# Patient Record
Sex: Female | Born: 1985 | Race: White | Hispanic: No | Marital: Married | Smoking: Never smoker
Health system: Southern US, Community
[De-identification: ages and names within clinical notes are randomized; demographics above are authoritative.]

## PROBLEM LIST (undated history)

## (undated) ENCOUNTER — Inpatient Hospital Stay: Payer: Self-pay | Admitting: Obstetrics and Gynecology

## (undated) ENCOUNTER — Inpatient Hospital Stay: Payer: Self-pay

## (undated) DIAGNOSIS — C7B09 Secondary carcinoid tumors of other sites: Secondary | ICD-10-CM

## (undated) DIAGNOSIS — J189 Pneumonia, unspecified organism: Secondary | ICD-10-CM

## (undated) DIAGNOSIS — Z3480 Encounter for supervision of other normal pregnancy, unspecified trimester: Secondary | ICD-10-CM

## (undated) DIAGNOSIS — R519 Headache, unspecified: Secondary | ICD-10-CM

## (undated) DIAGNOSIS — O99512 Diseases of the respiratory system complicating pregnancy, second trimester: Secondary | ICD-10-CM

## (undated) DIAGNOSIS — R918 Other nonspecific abnormal finding of lung field: Secondary | ICD-10-CM

## (undated) DIAGNOSIS — R011 Cardiac murmur, unspecified: Secondary | ICD-10-CM

## (undated) DIAGNOSIS — R51 Headache: Secondary | ICD-10-CM

## (undated) DIAGNOSIS — C7A00 Malignant carcinoid tumor of unspecified site: Secondary | ICD-10-CM

## (undated) DIAGNOSIS — Z3490 Encounter for supervision of normal pregnancy, unspecified, unspecified trimester: Secondary | ICD-10-CM

## (undated) HISTORY — DX: Encounter for supervision of normal pregnancy, unspecified, unspecified trimester: Z34.90

## (undated) HISTORY — DX: Pneumonia, unspecified organism: J18.9

## (undated) HISTORY — DX: Diseases of the respiratory system complicating pregnancy, second trimester: O99.512

## (undated) HISTORY — DX: Encounter for supervision of other normal pregnancy, unspecified trimester: Z34.80

## (undated) HISTORY — DX: Other nonspecific abnormal finding of lung field: R91.8

## (undated) HISTORY — DX: Cardiac murmur, unspecified: R01.1

---

## 2017-05-23 DIAGNOSIS — Z3480 Encounter for supervision of other normal pregnancy, unspecified trimester: Secondary | ICD-10-CM | POA: Insufficient documentation

## 2017-05-23 HISTORY — DX: Encounter for supervision of other normal pregnancy, unspecified trimester: Z34.80

## 2017-05-23 LAB — BASIC METABOLIC PANEL
BUN: 8 (ref 4–21)
Creatinine: 0.7 (ref 0.5–1.1)
Glucose: 89
Potassium: 3.9 (ref 3.4–5.3)
Sodium: 137 (ref 137–147)

## 2017-05-23 LAB — HEPATIC FUNCTION PANEL
ALT: 14 (ref 7–35)
AST: 16 (ref 13–35)

## 2017-05-23 LAB — HM HIV SCREENING LAB: HM HIV Screening: NEGATIVE

## 2017-05-25 LAB — HM PAP SMEAR: HM Pap smear: NEGATIVE

## 2017-06-20 NOTE — L&D Delivery Note (Signed)
Delivery Note At 8:58 PM a viable female was delivered via Vaginal, Spontaneous (Presentation: ;vtx  ).  APGAR: 8, 9; weight  .   Placenta status:intact , .  Cord:  with the following complications: .  Cord pH:N/A   precipitous second stage . Delivery attended by by nurse Placenta delivered by me . No laceration . Good hemostasis   Anesthesia:  cle Episiotomy:  none Lacerations:  none Suture Repair: n/a Est. Blood Loss (mL):  300cc  Mom to postpartum.  Baby to Couplet care / Skin to Skin.  Gwen Her Schermerhorn 11/18/2017, 9:16 PM

## 2017-08-29 ENCOUNTER — Other Ambulatory Visit: Payer: Self-pay | Admitting: Obstetrics and Gynecology

## 2017-08-29 ENCOUNTER — Encounter: Payer: Self-pay | Admitting: Obstetrics and Gynecology

## 2017-08-29 ENCOUNTER — Inpatient Hospital Stay
Admission: RE | Admit: 2017-08-29 | Discharge: 2017-08-31 | Disposition: A | Discharge status: Home / Self Care | Payer: Managed Care, Other (non HMO) | Source: Ambulatory Visit | Attending: Obstetrics and Gynecology | Admitting: Obstetrics and Gynecology

## 2017-08-29 DIAGNOSIS — O99512 Diseases of the respiratory system complicating pregnancy, second trimester: Principal | ICD-10-CM | POA: Insufficient documentation

## 2017-08-29 DIAGNOSIS — Z3A24 24 weeks gestation of pregnancy: Secondary | ICD-10-CM | POA: Diagnosis not present

## 2017-08-29 DIAGNOSIS — J189 Pneumonia, unspecified organism: Secondary | ICD-10-CM | POA: Diagnosis not present

## 2017-08-29 HISTORY — DX: Pneumonia, unspecified organism: J18.9

## 2017-08-29 LAB — CBC
HCT: 37.1 % (ref 35.0–47.0)
HEMOGLOBIN: 12.8 g/dL (ref 12.0–16.0)
MCH: 28.9 pg (ref 26.0–34.0)
MCHC: 34.4 g/dL (ref 32.0–36.0)
MCV: 83.9 fL (ref 80.0–100.0)
Platelets: 256 10*3/uL (ref 150–440)
RBC: 4.42 MIL/uL (ref 3.80–5.20)
RDW: 13.7 % (ref 11.5–14.5)
WBC: 15.3 10*3/uL — ABNORMAL HIGH (ref 3.6–11.0)

## 2017-08-29 MED ORDER — SODIUM CHLORIDE 0.9% FLUSH
3.0000 mL | Freq: Two times a day (BID) | INTRAVENOUS | Status: DC
  Administered 2017-08-29: 3 mL via INTRAVENOUS

## 2017-08-29 MED ORDER — SODIUM CHLORIDE 0.9 % IV SOLN: 250.0000 mL | INTRAVENOUS | Status: DC | PRN

## 2017-08-29 MED ORDER — GUAIFENESIN 100 MG/5ML PO SOLN
5.0000 mL | ORAL | Status: DC | PRN
  Administered 2017-08-29 – 2017-08-31 (×7): 100 mg via ORAL
  Filled 2017-08-29 (×8): qty 10
  Filled 2017-08-29: qty 5

## 2017-08-29 MED ORDER — DOCUSATE SODIUM 100 MG PO CAPS
100.0000 mg | ORAL_CAPSULE | Freq: Every day | ORAL | Status: DC
  Administered 2017-08-29: 100 mg via ORAL
  Filled 2017-08-29 (×2): qty 1

## 2017-08-29 MED ORDER — ACETAMINOPHEN 325 MG PO TABS
650.0000 mg | ORAL_TABLET | ORAL | Status: DC | PRN
  Administered 2017-08-29 – 2017-08-31 (×9): 650 mg via ORAL
  Filled 2017-08-29 (×10): qty 2

## 2017-08-29 MED ORDER — SODIUM CHLORIDE 0.9 % IV SOLN
2.0000 g | Freq: Every day | INTRAVENOUS | Status: DC
  Administered 2017-08-29 – 2017-08-31 (×3): 2 g via INTRAVENOUS
  Filled 2017-08-29 (×3): qty 20

## 2017-08-29 MED ORDER — SODIUM CHLORIDE 0.9 % IV SOLN
500.0000 mg | Freq: Every day | INTRAVENOUS | Status: DC
  Administered 2017-08-29 – 2017-08-31 (×3): 500 mg via INTRAVENOUS
  Filled 2017-08-29 (×3): qty 500

## 2017-08-29 MED ORDER — ZOLPIDEM TARTRATE 5 MG PO TABS: 5.0000 mg | ORAL_TABLET | Freq: Every evening | ORAL | Status: DC | PRN

## 2017-08-29 MED ORDER — CALCIUM CARBONATE ANTACID 500 MG PO CHEW: 2.0000 | CHEWABLE_TABLET | ORAL | Status: DC | PRN

## 2017-08-29 MED ORDER — SODIUM CHLORIDE 0.9% FLUSH: 3.0000 mL | INTRAVENOUS | Status: DC | PRN

## 2017-08-29 MED ORDER — PRENATAL MULTIVITAMIN CH
1.0000 | ORAL_TABLET | Freq: Every day | ORAL | Status: DC
  Administered 2017-08-30 – 2017-08-31 (×2): 1 via ORAL
  Filled 2017-08-29 (×2): qty 1

## 2017-08-29 NOTE — H&P (Signed)
ANTEPARTUM ADMISSION HISTORY AND PHYSICAL NOTE   History of Present Illness: Patricia Glenn is a 32 y.o. G3 P2002 at [redacted]w[redacted]d admitted for pneumonia confirmed with chest xray done on 08/28/17 and WBC 21.1k with febrile temp 102 during office visit on 08/28/17 for cough, fever and pluritic left chest pain.    Patient reports the fetal movement as active. Patient reports uterine contraction  activity as none. Patient reports  vaginal bleeding as none. Patient describes fluid per vagina as None.   Patient Active Problem List   Diagnosis Date Noted  . Pneumonia complicating pregnancy, second trimester 08/29/2017   Medical hx: obesity, BMI 34; elevated BPs at several prenatal visits- no official HTN dx.  Surgical hx: none OB hx: SVD of macrosomic infants x 2- no GDM, no complications Family hx: MGM- diabetes; maternal aunt- ovarian ca    OB History  Gravida Para Term Preterm AB Living  3 2       2   SAB TAB Ectopic Multiple Live Births          2    # Outcome Date GA Lbr Len/2nd Weight Sex Delivery Anes PTL Lv  3 Gravida           2 Para           1 Para               Social History   Socioeconomic History  . Marital status: Married    Spouse name: None  . Number of children: None  . Years of education: None  . Highest education level: None  Social Needs  . Financial resource strain: None  . Food insecurity - worry: None  . Food insecurity - inability: None  . Transportation needs - medical: None  . Transportation needs - non-medical: None  Occupational History  . None  Tobacco Use  . Smoking status: None  Substance and Sexual Activity  . Alcohol use: None  . Drug use: None  . Sexual activity: None  Other Topics Concern  . None  Social History Narrative  . None    No family history on file.  No Known Allergies  No medications prior to admission.    Review of Systems - Negative except cough x 2 weeks, pleuritic left chest pain, generalized malaise, body  aches, decreased appetite and fever.   Vitals:  BP (!) 128/55 (BP Location: Right Arm)   Pulse (!) 120   Temp 98.2 F (36.8 C) (Oral)   Resp 18   Ht 5\' 5"  (1.651 m)   SpO2 100%    Physical Examination: CONSTITUTIONAL: Well-developed, well-nourished female in no acute distress but frequently has to stop speaking due to cough.  HENT:  Normocephalic, atraumatic.  EYES: Conjunctivae and EOM are normal. Pupils are equal, round, and reactive to light. No scleral icterus.  NECK: Normal range of motion, supple, no masses SKIN: Skin is warm and dry. No rash noted. Not diaphoretic. No erythema. No pallor. [place]: Alert and oriented to person, place, and time. Normal reflexes, muscle tone coordination. No cranial nerve deficit noted. PSYCHIATRIC: Normal mood and affect. Normal behavior. Normal judgment and thought content. CARDIOVASCULAR: Normal heart rate noted, regular rhythm RESPIRATORY: Effort normal, decreased breath sounds with crackles at bases bilaterally, left < right.  ABDOMEN: Soft, nontender, nondistended, gravid. MUSCULOSKELETAL: Normal range of motion. No edema and no tenderness. 2+ distal pulses.  Cervix: Not evaluated.  Membranes:intact Fetal Monitoring:FHR via doppler 159bpm Tocometer: none  Labs:  Results for  orders placed or performed during the hospital encounter of 08/29/17 (from the past 24 hour(s))  CBC on admission   Collection Time: 08/29/17  4:06 PM  Result Value Ref Range   WBC 15.3 (H) 3.6 - 11.0 K/uL   RBC 4.42 3.80 - 5.20 MIL/uL   Hemoglobin 12.8 12.0 - 16.0 g/dL   HCT 37.1 35.0 - 47.0 %   MCV 83.9 80.0 - 100.0 fL   MCH 28.9 26.0 - 34.0 pg   MCHC 34.4 32.0 - 36.0 g/dL   RDW 13.7 11.5 - 14.5 %   Platelets 256 150 - 440 K/uL    Imaging Studies: No results found.   Assessment and Plan: Patient Active Problem List   Diagnosis Date Noted  . Pneumonia complicating pregnancy, second trimester 08/29/2017   Admit to Antenatal Routine antenatal care  with daily NST; FHR doppler with VS.  CBC on admission as above, improved since yesterday.  IV abx per consult with Dr Leonides Schanz, Rocephin 2gm IV q24hr and Azithromycin 500mg  IV q24hr   Demont Linford, [place] A, CNM 08/29/2017 9:50 PM

## 2017-08-30 ENCOUNTER — Other Ambulatory Visit: Payer: Self-pay

## 2017-08-30 DIAGNOSIS — O99512 Diseases of the respiratory system complicating pregnancy, second trimester: Secondary | ICD-10-CM | POA: Diagnosis not present

## 2017-08-30 LAB — CBC
HEMATOCRIT: 36.5 % (ref 35.0–47.0)
HEMOGLOBIN: 12 g/dL (ref 12.0–16.0)
MCH: 27.7 pg (ref 26.0–34.0)
MCHC: 32.9 g/dL (ref 32.0–36.0)
MCV: 84.2 fL (ref 80.0–100.0)
Platelets: 272 10*3/uL (ref 150–440)
RBC: 4.33 MIL/uL (ref 3.80–5.20)
RDW: 13.7 % (ref 11.5–14.5)
WBC: 11.9 10*3/uL — ABNORMAL HIGH (ref 3.6–11.0)

## 2017-08-30 MED ORDER — LACTATED RINGERS IV BOLUS (SEPSIS)
1000.0000 mL | Freq: Once | INTRAVENOUS | Status: AC
  Administered 2017-08-30: 1000 mL via INTRAVENOUS

## 2017-08-30 MED ORDER — LACTATED RINGERS IV SOLN
INTRAVENOUS | Status: DC
Start: 2017-08-30 — End: 2017-08-31
  Administered 2017-08-30: 22:00:00 via INTRAVENOUS

## 2017-08-30 NOTE — Progress Notes (Signed)
Patient in room crying uncontrollably, husband at bedside visibly frustrated- both were initially told on admission and earlier today patient would be able to discharge once second round of IV abx completed and VSS/afebrile. Patient states she feels much better today, no SOB, dry/non productive cough and doesn't understand why she needs to stay another night as she will rest better at home. Lisette Grinder, CNM notified and stated she will come back to talk to patient and husband again shortly.

## 2017-08-30 NOTE — Progress Notes (Signed)
RN in room due to pt calling for a snack; RN in room getting FHT and NT in room getting VS; RN attempted to flush the iv, which is a SL now; met resistance and pt cried out "ouch, that really hurts" when just a little bit of NS went into SL; this iv removed; AC (alicia) present on unit rounding and RN asked her to re-start iv; she went in room to start IV with vein finder; pt refused at this time stating, "I don't have antibiotics until about 6 o'clock pm, I just want the IV started when it's time for my medicines"; AC (alecia) informed RN to have day RN place order for IV team to start IV closer to 1800 today

## 2017-08-30 NOTE — Progress Notes (Signed)
Rn in room to get [place]; pt wanting to sleep tonight; RN made plan with pt; RN to come back when [place] are to be done again (every 8 hours per order) OR if pt calls Rn for anything; pt knows that she can have tylenol and robitussin again at [place]; pt happy with plan; husband at bedside in [place] chair

## 2017-08-30 NOTE — Progress Notes (Signed)
LATE ENTRY   ANTEPARTUM PROGRESS NOTE  Patricia Glenn is a 32 y.o. G3P2 at [redacted]w[redacted]d who is admitted for pneumonia.  Estimated Date of Delivery: 12/18/17.  Length of Stay:  1 Days. Admitted 08/29/2017  Subjective: Still coughing, but overall feeling better.   Patient reports good fetal movement. She reports no uterine contractions, no bleeding and no loss of fluid per vagina.  Vitals:  BP (!) 115/53 (BP Location: Right Arm)   Pulse (!) 103   Temp 98.6 F (37 C) (Oral)   Resp 20   Ht 5\' 5"  (1.651 m)   LMP 03/13/2017   SpO2 100%  Physical Examination: General:   alert, cooperative, appears stated age and no distress  Skin:  normal and no rash or abnormalities  Neurologic:    Alert & oriented x 3  Lungs:   clear to auscultation bilaterally  Heart:   regular rate and rhythm, S1, S2 normal, no murmur, click, rub or gallop  Abdomen:  soft, non-tender; bowel sounds normal; no masses,  no organomegaly  Pelvis:  Exam deferred.  FHT:  153 BPM by doppler this AM  Extremities: : non-tender, symmetric, no edema bilaterally.      Results for orders placed or performed during the hospital encounter of 08/29/17 (from the past 48 hour(s))  CBC on admission     Status: Abnormal   Collection Time: 08/29/17  4:06 PM  Result Value Ref Range   WBC 15.3 (H) 3.6 - 11.0 K/uL   RBC 4.42 3.80 - 5.20 MIL/uL   Hemoglobin 12.8 12.0 - 16.0 g/dL   HCT 37.1 35.0 - 47.0 %   MCV 83.9 80.0 - 100.0 fL   MCH 28.9 26.0 - 34.0 pg   MCHC 34.4 32.0 - 36.0 g/dL   RDW 13.7 11.5 - 14.5 %   Platelets 256 150 - 440 K/uL    Comment: Performed at [facility], [address] [place], [place] [address]  CBC     Status: Abnormal   Collection Time: 08/30/17 11:09 AM  Result Value Ref Range   WBC 11.9 (H) 3.6 - 11.0 K/uL   RBC 4.33 3.80 - 5.20 MIL/uL   Hemoglobin 12.0 12.0 - 16.0 g/dL   HCT 36.5 35.0 - 47.0 %   MCV 84.2 80.0 - 100.0 fL   MCH 27.7 26.0 - 34.0 pg   MCHC 32.9 32.0 - 36.0 g/dL   RDW 13.7 11.5 -  14.5 %   Platelets 272 150 - 440 K/uL    Comment: Performed at [facility], [address] [place], [place] [address]     Current scheduled medications . docusate sodium  100 mg Oral Daily  . prenatal multivitamin  1 tablet Oral Q1200  . sodium chloride flush  3 mL Intravenous Q12H    I have reviewed the patient's current medications.  ASSESSMENT: Patient Active Problem List   Diagnosis Date Noted  . Pneumonia complicating pregnancy, second trimester 08/29/2017    PLAN: Pneumonia: -Afebrile, BP stable -1L LR bolus for HR 100s -Continue IV ABX: ceftriaxone 2gm IV q24h and azithromycin 500mg  IV q24h -FHR by doppler q shift -Daily CBC -Continue inpatient monitoring for 48-72 hours per Dr. Lillette Boxer, CNM 08/30/2017 5:17 PM  Lisette Grinder Certified Nurse Midwife [facility]

## 2017-08-30 NOTE — Plan of Care (Signed)
Vs stable; up ad lib; tolerating regular diet; has 2 iv antibiotics daily around 1800 for pneumonia; FHT WNL; pt did have an NST this shift; has tylenol and robutussin ordered PRN

## 2017-08-31 DIAGNOSIS — O99512 Diseases of the respiratory system complicating pregnancy, second trimester: Secondary | ICD-10-CM | POA: Diagnosis not present

## 2017-08-31 LAB — CBC
HEMATOCRIT: 34 % — AB (ref 35.0–47.0)
HEMOGLOBIN: 11.4 g/dL — AB (ref 12.0–16.0)
MCH: 28.4 pg (ref 26.0–34.0)
MCHC: 33.4 g/dL (ref 32.0–36.0)
MCV: 84.9 fL (ref 80.0–100.0)
Platelets: 256 10*3/uL (ref 150–440)
RBC: 4 MIL/uL (ref 3.80–5.20)
RDW: 13.7 % (ref 11.5–14.5)
WBC: 11.9 10*3/uL — ABNORMAL HIGH (ref 3.6–11.0)

## 2017-08-31 MED ORDER — GUAIFENESIN 100 MG/5ML PO SOLN: 5.0000 mL | ORAL | 0 refills | Status: DC | PRN

## 2017-08-31 MED ORDER — AMOXICILLIN-POT CLAVULANATE 875-125 MG PO TABS: 1.0000 | ORAL_TABLET | Freq: Two times a day (BID) | ORAL | 0 refills | Status: AC

## 2017-08-31 NOTE — Progress Notes (Signed)
Patient understands all discharge instructions and the need to make follow up appointments and instructions on proper medication adminstration. Patient insisted on walking her self downstairs.

## 2017-08-31 NOTE — Plan of Care (Signed)
Vs improved; up ad lib; tolerating regular diet; able to shower last night; taking tylenol and robitussin; probable discharge today

## 2017-08-31 NOTE — Discharge Summary (Signed)
Physician Discharge Summary  Patient ID: Patricia Glenn MRN: [id number] DOB/AGE: [birth year] 32 y.o.  Admit date: 08/29/2017 Discharge date: 08/31/2017  Admission Diagnoses:[redacted] week EGA , LLL pneumonia   Discharge Diagnoses: same  Active Problems:   Pneumonia complicating pregnancy, second trimester   Discharged Condition: good  Hospital Course: pt admitted with LLL pneumonia . Satrted on IV rocephin 2 gm q 24 and 500 mg zithromycin q 24 . Marland Kitchen Pt defervesed and pleuritic cp improved . Feel well on d/c   Consults: None  Significant Diagnostic Studies: labs:  Results for orders placed or performed during the hospital encounter of 08/29/17 (from the past 72 hour(s))  CBC on admission     Status: Abnormal   Collection Time: 08/29/17  4:06 PM  Result Value Ref Range   WBC 15.3 (H) 3.6 - 11.0 K/uL   RBC 4.42 3.80 - 5.20 MIL/uL   Hemoglobin 12.8 12.0 - 16.0 g/dL   HCT 37.1 35.0 - 47.0 %   MCV 83.9 80.0 - 100.0 fL   MCH 28.9 26.0 - 34.0 pg   MCHC 34.4 32.0 - 36.0 g/dL   RDW 13.7 11.5 - 14.5 %   Platelets 256 150 - 440 K/uL    Comment: Performed at [facility], [address] [place], [place] [address]  CBC     Status: Abnormal   Collection Time: 08/30/17 11:09 AM  Result Value Ref Range   WBC 11.9 (H) 3.6 - 11.0 K/uL   RBC 4.33 3.80 - 5.20 MIL/uL   Hemoglobin 12.0 12.0 - 16.0 g/dL   HCT 36.5 35.0 - 47.0 %   MCV 84.2 80.0 - 100.0 fL   MCH 27.7 26.0 - 34.0 pg   MCHC 32.9 32.0 - 36.0 g/dL   RDW 13.7 11.5 - 14.5 %   Platelets 272 150 - 440 K/uL    Comment: Performed at [facility], [address] [place], [place] [address]  CBC     Status: Abnormal   Collection Time: 08/31/17  5:53 AM  Result Value Ref Range   WBC 11.9 (H) 3.6 - 11.0 K/uL   RBC 4.00 3.80 - 5.20 MIL/uL   Hemoglobin 11.4 (L) 12.0 - 16.0 g/dL   HCT 34.0 (L) 35.0 - 47.0 %   MCV 84.9 80.0 - 100.0 fL   MCH 28.4 26.0 - 34.0 pg   MCHC 33.4 32.0 - 36.0 g/dL   RDW 13.7 11.5 - 14.5 %   Platelets  256 150 - 440 K/uL    Comment: Performed at [facility], [address], [place], [place] [address]   Treatments:IV ABX  Discharge Exam: Blood pressure 114/66, pulse 98, temperature 98.3 F (36.8 C), resp. rate 18, height 5\' 5"  (1.651 m), last menstrual period 03/13/2017, SpO2 99 %. General appearance: alert and cooperative Resp: clear to auscultation bilaterally and normal percussion bilaterally Cardio: regular rate and rhythm, S1, S2 normal, no murmur, click, rub or gallop GI: soft, non-tender; bowel sounds normal; no masses,  no organomegaly  Disposition: Final discharge disposition not confirmed See will take her azithromycin 500 mg q 24 for 2 additional days  Discharge Instructions    Call MD for:   Complete by:  As directed    SOB ,   Call MD for:  difficulty breathing, headache or visual disturbances   Complete by:  As directed    Call MD for:  extreme fatigue   Complete by:  As directed    Call MD for:  hives   Complete by:  As directed    Call MD for:  persistant dizziness or light-headedness   Complete by:  As directed    Call MD for:  persistant nausea and vomiting   Complete by:  As directed    Call MD for:  redness, tenderness, or signs of infection (pain, swelling, redness, odor or green/yellow discharge around incision site)   Complete by:  As directed    Call MD for:  severe uncontrolled pain   Complete by:  As directed    Call MD for:  temperature >100.4   Complete by:  As directed    Diet - low sodium heart healthy   Complete by:  As directed    Increase activity slowly   Complete by:  As directed      Allergies as of 08/31/2017   No Known Allergies     Medication List    TAKE these medications   amoxicillin-clavulanate 875-125 MG tablet Commonly known as:  AUGMENTIN Take 1 tablet by mouth 2 (two) times daily for 5 days.   guaiFENesin 100 MG/5ML Soln Commonly known as:  ROBITUSSIN Take 5 mLs (100 mg total) by mouth every 4 (four) hours  as needed for cough or to loosen phlegm.        Signed: Gwen Her Schermerhorn 08/31/2017, 4:13 PM

## 2017-10-03 LAB — CBC AND DIFFERENTIAL
HCT: 37 (ref 36–46)
Hemoglobin: 12.3 (ref 12.0–16.0)
Platelets: 214 (ref 150–399)
WBC: 7.8

## 2017-10-03 LAB — BASIC METABOLIC PANEL: Glucose: 104

## 2017-11-15 ENCOUNTER — Ambulatory Visit
Admission: RE | Admit: 2017-11-15 | Discharge: 2017-11-15 | Disposition: A | Discharge status: Home / Self Care | Payer: Managed Care, Other (non HMO) | Source: Ambulatory Visit | Attending: Specialist | Admitting: Specialist

## 2017-11-15 ENCOUNTER — Other Ambulatory Visit: Payer: Self-pay | Admitting: Specialist

## 2017-11-15 DIAGNOSIS — R918 Other nonspecific abnormal finding of lung field: Secondary | ICD-10-CM

## 2017-11-15 DIAGNOSIS — R042 Hemoptysis: Secondary | ICD-10-CM | POA: Insufficient documentation

## 2017-11-15 MED ORDER — IOPAMIDOL (ISOVUE-370) INJECTION 76%
75.0000 mL | Freq: Once | INTRAVENOUS | Status: AC | PRN
  Administered 2017-11-15: 75 mL via INTRAVENOUS

## 2017-11-16 ENCOUNTER — Other Ambulatory Visit: Payer: Self-pay | Admitting: Obstetrics and Gynecology

## 2017-11-16 ENCOUNTER — Inpatient Hospital Stay
Admission: EM | Admit: 2017-11-16 | Discharge: 2017-11-16 | Disposition: A | Discharge status: Home / Self Care | Payer: Managed Care, Other (non HMO) | Source: Home / Self Care | Admitting: Obstetrics and Gynecology

## 2017-11-16 ENCOUNTER — Encounter: Payer: Self-pay | Admitting: *Deleted

## 2017-11-16 ENCOUNTER — Other Ambulatory Visit: Payer: Self-pay | Admitting: Specialist

## 2017-11-16 ENCOUNTER — Telehealth: Payer: Self-pay | Admitting: Internal Medicine

## 2017-11-16 MED ORDER — BETAMETHASONE SOD PHOS & ACET 6 (3-3) MG/ML IJ SUSP: 12.0000 mg | INTRAMUSCULAR | Status: DC

## 2017-11-16 MED ORDER — BETAMETHASONE SOD PHOS & ACET 6 (3-3) MG/ML IJ SUSP
12.0000 mg | INTRAMUSCULAR | Status: DC
  Administered 2017-11-16: 12 mg via INTRAMUSCULAR

## 2017-11-16 MED ORDER — BETAMETHASONE SOD PHOS & ACET 6 (3-3) MG/ML IJ SUSP
INTRAMUSCULAR | Status: AC
  Administered 2017-11-16: 12 mg via INTRAMUSCULAR
  Filled 2017-11-16: qty 1

## 2017-11-16 NOTE — Progress Notes (Signed)
Set up early induction for tomorrow night with plan for BMZ now and tomorrow. Ordered through [employer]

## 2017-11-16 NOTE — Telephone Encounter (Signed)
Spoke with patient and she is aware of her appt with Dr. Rogue Bussing on 5/31 at 3pm. Contact info given and instructed to call with further questions. Pt verbalized understanding.

## 2017-11-16 NOTE — OB Triage Note (Signed)
Patient came in for first dose of Betamethasone shot. Patient reports  + FM upon arrival and no other complaints. Patient wants to speak to the neonatal nurse practitioner to ask questions pertaining to baby.

## 2017-11-16 NOTE — Telephone Encounter (Signed)
We can see the patient tomorrow at 3pm- have patient arrive at 60

## 2017-11-16 NOTE — Telephone Encounter (Signed)
disucsed with Dr.Fleming; re: large LLL mass; [redacted] weeks pregnant.

## 2017-11-17 ENCOUNTER — Encounter: Payer: Self-pay | Admitting: *Deleted

## 2017-11-17 ENCOUNTER — Encounter: Payer: Self-pay | Admitting: Internal Medicine

## 2017-11-17 ENCOUNTER — Other Ambulatory Visit: Payer: Self-pay | Admitting: Certified Nurse Midwife

## 2017-11-17 ENCOUNTER — Inpatient Hospital Stay: Payer: Managed Care, Other (non HMO) | Attending: Internal Medicine | Admitting: Internal Medicine

## 2017-11-17 ENCOUNTER — Other Ambulatory Visit: Payer: Self-pay

## 2017-11-17 ENCOUNTER — Inpatient Hospital Stay
Admission: EM | Admit: 2017-11-17 | Discharge: 2017-11-19 | DRG: 807 | Disposition: A | Discharge status: Home / Self Care | Payer: Managed Care, Other (non HMO) | Attending: Certified Nurse Midwife | Admitting: Certified Nurse Midwife

## 2017-11-17 VITALS — BP 131/76 | HR 114 | Temp 98.1°F | Resp 18 | Ht 67.0 in | Wt 229.0 lb

## 2017-11-17 DIAGNOSIS — Z3490 Encounter for supervision of normal pregnancy, unspecified, unspecified trimester: Secondary | ICD-10-CM

## 2017-11-17 DIAGNOSIS — R918 Other nonspecific abnormal finding of lung field: Secondary | ICD-10-CM | POA: Insufficient documentation

## 2017-11-17 DIAGNOSIS — C7B09 Secondary carcinoid tumors of other sites: Secondary | ICD-10-CM

## 2017-11-17 DIAGNOSIS — O26893 Other specified pregnancy related conditions, third trimester: Principal | ICD-10-CM | POA: Diagnosis present

## 2017-11-17 DIAGNOSIS — R042 Hemoptysis: Secondary | ICD-10-CM | POA: Diagnosis not present

## 2017-11-17 DIAGNOSIS — Z3A35 35 weeks gestation of pregnancy: Secondary | ICD-10-CM

## 2017-11-17 DIAGNOSIS — C7A00 Malignant carcinoid tumor of unspecified site: Secondary | ICD-10-CM | POA: Insufficient documentation

## 2017-11-17 HISTORY — DX: Encounter for supervision of normal pregnancy, unspecified, unspecified trimester: Z34.90

## 2017-11-17 HISTORY — DX: Other nonspecific abnormal finding of lung field: R91.8

## 2017-11-17 LAB — CBC
HCT: 34.4 % — ABNORMAL LOW (ref 35.0–47.0)
HEMOGLOBIN: 12.4 g/dL (ref 12.0–16.0)
MCH: 30.3 pg (ref 26.0–34.0)
MCHC: 36.2 g/dL — ABNORMAL HIGH (ref 32.0–36.0)
MCV: 83.8 fL (ref 80.0–100.0)
Platelets: 230 10*3/uL (ref 150–440)
RBC: 4.11 MIL/uL (ref 3.80–5.20)
RDW: 14.8 % — ABNORMAL HIGH (ref 11.5–14.5)
WBC: 12.2 10*3/uL — ABNORMAL HIGH (ref 3.6–11.0)

## 2017-11-17 LAB — TYPE AND SCREEN
ABO/RH(D): O POS
ANTIBODY SCREEN: NEGATIVE

## 2017-11-17 MED ORDER — MISOPROSTOL 200 MCG PO TABS
ORAL_TABLET | ORAL | Status: AC
  Administered 2017-11-17: 25 ug via BUCCAL
  Filled 2017-11-17: qty 1

## 2017-11-17 MED ORDER — ONDANSETRON HCL 4 MG/2ML IJ SOLN: 4.0000 mg | Freq: Four times a day (QID) | INTRAMUSCULAR | Status: DC | PRN

## 2017-11-17 MED ORDER — BETAMETHASONE SOD PHOS & ACET 6 (3-3) MG/ML IJ SUSP
12.0000 mg | Freq: Once | INTRAMUSCULAR | Status: AC
  Administered 2017-11-17: 12 mg via INTRAMUSCULAR

## 2017-11-17 MED ORDER — LACTATED RINGERS IV SOLN: 500.0000 mL | INTRAVENOUS | Status: DC | PRN

## 2017-11-17 MED ORDER — TERBUTALINE SULFATE 1 MG/ML IJ SOLN: 0.2500 mg | Freq: Once | INTRAMUSCULAR | Status: DC | PRN

## 2017-11-17 MED ORDER — LIDOCAINE HCL (PF) 1 % IJ SOLN: 30.0000 mL | INTRAMUSCULAR | Status: DC | PRN

## 2017-11-17 MED ORDER — OXYTOCIN BOLUS FROM INFUSION
500.0000 mL | Freq: Once | INTRAVENOUS | Status: AC
  Administered 2017-11-18: 500 mL via INTRAVENOUS

## 2017-11-17 MED ORDER — ACETAMINOPHEN 325 MG PO TABS
650.0000 mg | ORAL_TABLET | ORAL | Status: DC | PRN
  Filled 2017-11-17: qty 2

## 2017-11-17 MED ORDER — SOD CITRATE-CITRIC ACID 500-334 MG/5ML PO SOLN: 30.0000 mL | ORAL | Status: DC | PRN

## 2017-11-17 MED ORDER — OXYTOCIN 10 UNIT/ML IJ SOLN
INTRAMUSCULAR | Status: AC
  Filled 2017-11-17: qty 2

## 2017-11-17 MED ORDER — CEFUROXIME AXETIL 500 MG PO TABS
500.0000 mg | ORAL_TABLET | Freq: Two times a day (BID) | ORAL | Status: DC
  Administered 2017-11-17 – 2017-11-19 (×3): 500 mg via ORAL
  Filled 2017-11-17: qty 1

## 2017-11-17 MED ORDER — LACTATED RINGERS IV SOLN
INTRAVENOUS | Status: DC
  Administered 2017-11-17 – 2017-11-18 (×4): via INTRAVENOUS

## 2017-11-17 MED ORDER — AMMONIA AROMATIC IN INHA
RESPIRATORY_TRACT | Status: AC
  Filled 2017-11-17: qty 10

## 2017-11-17 MED ORDER — BETAMETHASONE SOD PHOS & ACET 6 (3-3) MG/ML IJ SUSP
INTRAMUSCULAR | Status: AC
Start: 2017-11-17 — End: 2017-11-17
  Administered 2017-11-17: 12 mg via INTRAMUSCULAR
  Filled 2017-11-17: qty 1

## 2017-11-17 MED ORDER — MISOPROSTOL 25 MCG QUARTER TABLET
25.0000 ug | ORAL_TABLET | ORAL | Status: DC | PRN
  Administered 2017-11-17 – 2017-11-18 (×4): 25 ug via VAGINAL
  Filled 2017-11-17 (×3): qty 1

## 2017-11-17 MED ORDER — MISOPROSTOL 25 MCG QUARTER TABLET
25.0000 ug | ORAL_TABLET | Freq: Once | ORAL | Status: AC
  Administered 2017-11-17: 25 ug via BUCCAL
  Filled 2017-11-17: qty 1

## 2017-11-17 MED ORDER — PENICILLIN G POT IN DEXTROSE 60000 UNIT/ML IV SOLN
3.0000 10*6.[IU] | INTRAVENOUS | Status: DC
  Administered 2017-11-18 (×2): 3 10*6.[IU] via INTRAVENOUS
  Filled 2017-11-17 (×13): qty 50

## 2017-11-17 MED ORDER — SODIUM CHLORIDE 0.9 % IV SOLN
5.0000 10*6.[IU] | Freq: Once | INTRAVENOUS | Status: AC
  Administered 2017-11-18: 5 10*6.[IU] via INTRAVENOUS
  Filled 2017-11-17: qty 5

## 2017-11-17 MED ORDER — LIDOCAINE HCL (PF) 1 % IJ SOLN
INTRAMUSCULAR | Status: AC
  Filled 2017-11-17: qty 30

## 2017-11-17 MED ORDER — OXYCODONE-ACETAMINOPHEN 5-325 MG PO TABS: 1.0000 | ORAL_TABLET | ORAL | Status: DC | PRN

## 2017-11-17 MED ORDER — OXYCODONE-ACETAMINOPHEN 5-325 MG PO TABS: 2.0000 | ORAL_TABLET | ORAL | Status: DC | PRN

## 2017-11-17 MED ORDER — OXYTOCIN 40 UNITS IN LACTATED RINGERS INFUSION - SIMPLE MED
2.5000 [IU]/h | INTRAVENOUS | Status: DC
  Filled 2017-11-17 (×2): qty 1000

## 2017-11-17 MED ORDER — BUTORPHANOL TARTRATE 1 MG/ML IJ SOLN: 1.0000 mg | INTRAMUSCULAR | Status: DC | PRN

## 2017-11-17 NOTE — Progress Notes (Signed)
[facility] CONSULT NOTE  Patient Care Team: Patient, No Pcp Per as PCP - General (General Practice)  CHIEF COMPLAINTS/PURPOSE OF CONSULTATION:  Left lower lobe lung mass    No history exists.     HISTORY OF PRESENTING ILLNESS:  Patricia Glenn 32 y.o.  female with no prior history of smoking; currently [redacted] weeks pregnant-  was diagnosed with left lower lobe pneumonia sometime in March 2019.  Patient was admitted to the hospital for IV antibiotics-with resolution of fevers.   Patient then was followed up on outpatient basis by pulmonary; Dr. Raul Del as her left lower lobe infiltrate did not improve.  Patient had episode of hemoptysis; when patient subsequently got a CT scan-of the chest that showed unfortunately a 4 to 5 cm large mass posterior/left lower lobe with concerns for local invasion.  She has been referred to oncology for further evaluation recommendations.  Patient denies any loss of appetite.  Is very active.  Denies any nausea vomiting.   Review of Systems  Constitutional: Negative for chills, diaphoresis, fever, malaise/fatigue and weight loss.  HENT: Negative for nosebleeds and sore throat.   Eyes: Negative for double vision.  Respiratory: Negative for cough, hemoptysis, sputum production, shortness of breath and wheezing.   Cardiovascular: Negative for chest pain, palpitations, orthopnea and leg swelling.  Gastrointestinal: Negative for abdominal pain, blood in stool, constipation, diarrhea, heartburn, melena, nausea and vomiting.  Genitourinary: Negative for dysuria, frequency and urgency.  Musculoskeletal: Negative for back pain and joint pain.  Skin: Negative.  Negative for itching and rash.  Neurological: Negative for dizziness, tingling, focal weakness, weakness and headaches.  Endo/Heme/Allergies: Does not bruise/bleed easily.  Psychiatric/Behavioral: Negative for depression. The patient is not nervous/anxious and does not have insomnia.       MEDICAL HISTORY:  History reviewed. No pertinent past medical history.  SURGICAL HISTORY: History reviewed. No pertinent surgical history.  SOCIAL HISTORY: Social History   Socioeconomic History  . Marital status: Married    Spouse name: Not on file  . Number of children: Not on file  . Years of education: Not on file  . Highest education level: Not on file  Occupational History  . Not on file  Social Needs  . Financial resource strain: Not on file  . Food insecurity:    Worry: Not on file    Inability: Not on file  . Transportation needs:    Medical: Not on file    Non-medical: Not on file  Tobacco Use  . Smoking status: Never Smoker  . Smokeless tobacco: Never Used  Substance and Sexual Activity  . Alcohol use: No    Frequency: Never  . Drug use: No  . Sexual activity: Not on file  Lifestyle  . Physical activity:    Days per week: Not on file    Minutes per session: Not on file  . Stress: Not on file  Relationships  . Social connections:    Talks on phone: Not on file    Gets together: Not on file    Attends religious service: Not on file    Active member of club or organization: Not on file    Attends meetings of clubs or organizations: Not on file    Relationship status: Not on file  . Intimate partner violence:    Fear of current or ex partner: Not on file    Emotionally abused: Not on file    Physically abused: Not on file    Forced sexual  activity: Not on file  Other Topics Concern  . Not on file  Social History Narrative    stay home mom-; lives at home; 8 miles from here; no smoking; no second smoking; alcohol ocassional; 2 daughters.     FAMILY HISTORY: grandpa- 80s- bladder cancer  Family History  Problem Relation Age of Onset  . Bladder Cancer Maternal Grandfather 73    ALLERGIES:  has No Known Allergies.  MEDICATIONS:  Current Outpatient Medications  Medication Sig Dispense Refill  . PRENATAL 28-0.8 MG TABS Take 1 tablet by mouth  daily.     Current Facility-Administered Medications  Medication Dose Route Frequency Provider Last Rate Last Dose  . betamethasone acetate-betamethasone sodium phosphate (CELESTONE) injection 12 mg  12 mg Intramuscular Q24 Hr x 2 Benjaman Kindler, MD          .  PHYSICAL EXAMINATION: ECOG PERFORMANCE STATUS: 0 - Asymptomatic  Vitals:   11/17/17 1505  BP: 131/76  Pulse: (!) 114  Resp: 18  Temp: 98.1 F (36.7 C)  SpO2: 99%   Filed Weights   11/17/17 1505  Weight: 229 lb (103.9 kg)    Physical Exam  Constitutional: She is oriented to person, place, and time and well-developed, well-nourished, and in no distress.  HENT:  Head: Normocephalic and atraumatic.  Mouth/Throat: Oropharynx is clear and moist. No oropharyngeal exudate.  Eyes: Pupils are equal, round, and reactive to light.  Neck: Normal range of motion. Neck supple.  Cardiovascular: Normal rate and regular rhythm.  Pulmonary/Chest: No respiratory distress. She has no wheezes.  Abdominal: Soft. Bowel sounds are normal. She exhibits distension. She exhibits no mass. There is no tenderness. There is no rebound and no guarding.  Gravid uterus  Musculoskeletal: Normal range of motion. She exhibits no edema or tenderness.  Neurological: She is alert and oriented to person, place, and time.  Skin: Skin is warm.  Psychiatric: Affect normal.     LABORATORY DATA:  I have reviewed the data as listed Lab Results  Component Value Date   WBC 11.9 (H) 08/31/2017   HGB 11.4 (L) 08/31/2017   HCT 34.0 (L) 08/31/2017   MCV 84.9 08/31/2017   PLT 256 08/31/2017   No results for input(s): NA, K, CL, CO2, GLUCOSE, BUN, CREATININE, CALCIUM, GFRNONAA, GFRAA, PROT, ALBUMIN, AST, ALT, ALKPHOS, BILITOT, BILIDIR, IBILI in the last 8760 hours.  RADIOGRAPHIC STUDIES: I have personally reviewed the radiological images as listed and agreed with the findings in the report. Ct Angio Chest Pe W Or Wo Contrast  Result Date:  11/15/2017 CLINICAL DATA:  Hemoptysis.  Pneumonia.  Thirty-five weeks pregnant. EXAM: CT ANGIOGRAPHY CHEST WITH CONTRAST TECHNIQUE: Multidetector CT imaging of the chest was performed using the standard protocol during bolus administration of intravenous contrast. Multiplanar CT image reconstructions and MIPs were obtained to evaluate the vascular anatomy. CONTRAST:  53mL ISOVUE-370 IOPAMIDOL (ISOVUE-370) INJECTION 76% COMPARISON:  None. FINDINGS: Cardiovascular: There is no filling defect in the pulmonary arterial tree to suggest acute pulmonary thromboembolism. No evidence of aortic aneurysm. Great vessels are grossly patent. Mediastinum/Nodes: No abnormal mediastinal adenopathy. Esophagus is unremarkable. No pericardial effusion. Lungs/Pleura: There is a large left lower lobe mass extending from the left hilum to the left lung base measuring 3.3 x 4.7 x 3.3 cm worrisome for malignancy. It is lobulated and somewhat low density. There is a polypoid element projecting into the left lower lobe lobar airway. There is also occlusion of the left lower lobe bronchus. No pneumothorax.  No pleural effusion.  Upper Abdomen: The mass extends through the diaphragm and appears to invade the spleen. Musculoskeletal: No vertebral compression deformity. Review of the MIP images confirms the above findings. IMPRESSION: Large left lower lobe lung mass extending through the diaphragm in and invading the spleen associated with occlusion of the left lower lobe bronchus. Findings are worrisome for malignancy. PET-CT is recommended. No evidence of pulmonary thromboembolism Electronically Signed   By: Marybelle Killings M.D.   On: 11/15/2017 17:40    ASSESSMENT & PLAN:   Mass of lower lobe of left lung #Left lower lobe lung mass approximately 5 cm in size-with concerns for local invasion-highly concerning for malignancy.  #Discussed regarding need for tissue diagnosis/bronchoscopy.  Discussed that treatment options would include; if  this turns out to be malignancy-including but not limited to-chemotherapy radiation; targeted therapy.  Patient will have a high likely chance of novel mutations-which could well be treated with oral medications.  #Patient would need further work-up including PET scan and MRI of the brain.  #Patient's case was reviewed at the tumor conference; on May 30th.  Referral made to pulmonary, for bronchoscopy approximately 1 week post delivery.  #Third trimester pregnancy-plan to be induced over the weekend.  #Further work-up bronchoscopy/PET scan MRI brain after the delivery.  Discussed with Dr. Ouida Sills.   Thank you Dr. Raul Del for allowing me to participate in the care of your pleasant patient. Please do not hesitate to contact me with questions or concerns in the interim.  Patient introduced to lung navigator [place].  #Follow-up with me/covering physician based upon biopsy results.   .# I reviewed the blood work- with the patient in detail; also reviewed the imaging independently [as summarized above]; and with the patient in detail.   # 60 minutes face-to-face with the patient discussing the above plan of care; more than 50% of time spent on prognosis/ natural history; counseling and coordination.      All questions were answered. The patient knows to call the clinic with any problems, questions or concerns.    Cammie Sickle, MD 11/17/2017 4:59 PM

## 2017-11-17 NOTE — Assessment & Plan Note (Addendum)
#  Left lower lobe lung mass approximately 5 cm in size-with concerns for local invasion-highly concerning for malignancy.  #Discussed regarding need for tissue diagnosis/bronchoscopy.  Discussed that treatment options would include; if this turns out to be malignancy-including but not limited to-chemotherapy radiation; targeted therapy.  Patient will have a high likely chance of novel mutations-which could well be treated with oral medications.  #Patient would need further work-up including PET scan and MRI of the brain.  #Patient's case was reviewed at the tumor conference; on May 30th.  Referral made to pulmonary, for bronchoscopy approximately 1 week post delivery.  #Third trimester pregnancy-plan to be induced over the weekend.  #Further work-up bronchoscopy/PET scan MRI brain after the delivery.  Discussed with Dr. Ouida Sills.   Thank you Dr. Raul Del for allowing me to participate in the care of your pleasant patient. Please do not hesitate to contact me with questions or concerns in the interim.  Patient introduced to lung navigator Rio.  #Follow-up with me/covering physician based upon biopsy results.   .# I reviewed the blood work- with the patient in detail; also reviewed the imaging independently [as summarized above]; and with the patient in detail.   # 60 minutes face-to-face with the patient discussing the above plan of care; more than 50% of time spent on prognosis/ natural history; counseling and coordination.

## 2017-11-17 NOTE — H&P (Signed)
OB History & Physical   History of Present Illness:  Chief Complaint:   HPI:  Patricia Glenn is a 32 y.o. G4P2 female at [redacted]w[redacted]d dated by LMP c/w [redacted]w[redacted]d ultrasound.  She presents to L&D for induction of labor due to lung mass concerning for malignancy.   She reports:  -active fetal movement -no leakage of fluid  -no vaginal bleeding -no contractions  Pregnancy Issues: 1. Pre-pregnancy BMI 34 2. History of LGA babies (4309g and 4139g, no GDM) 3. LLL pneumonia with inpatient hospitalization at 24 weeks 4. Left lung mass concerning for malignancy   Maternal Medical History:  History reviewed. No pertinent past medical history.  History reviewed. No pertinent surgical history.  No Known Allergies  Prior to Admission medications   Medication Sig Start Date End Date Taking? Authorizing Provider  PRENATAL 28-0.8 MG TABS Take 1 tablet by mouth daily.   Yes [provider]     Prenatal care site: [facility] History: She  reports that she has never smoked. She has never used smokeless tobacco. She reports that she does not drink alcohol or use drugs.  Family History: family history includes Bladder Cancer (age of onset: 34) in her maternal grandfather.   Review of Systems: A full review of systems was performed and negative except as noted in the HPI.    Physical Exam:  Vital Signs: BP 134/70 (BP Location: Left Arm)   Pulse (!) 105   Temp 98 F (36.7 C) (Oral)   Resp 18   Ht 5\' 7"  (1.702 m)   Wt 103.9 kg (229 lb)   LMP 03/13/2017   BMI 35.87 kg/m   General:   alert, cooperative, appears stated age and no distress  Skin:  normal and no rash or abnormalities  Neurologic:    Alert & oriented x 3  Lungs:   clear to auscultation bilaterally  Heart:   regular rate and rhythm, S1, S2 normal, no murmur, click, rub or gallop  Abdomen:  soft, non-tender; bowel sounds normal; no masses,  no organomegaly  Pelvis:  Exam deferred.  FHT:  145 BPM   Presentations: cephalic  Cervix:  by RN Marisa Sprinkles at 2105   Dilation: Closed   Effacement: [place]:  Floating   Consistency: soft  Extremities: : non-tender, symmetric, no edema bilaterally.     EFW: 6lbs  Results for orders placed or performed during the hospital encounter of 11/17/17 (from the past 24 hour(s))  CBC     Status: Abnormal   Collection Time: 11/17/17  8:22 PM  Result Value Ref Range   WBC 12.2 (H) 3.6 - 11.0 K/uL   RBC 4.11 3.80 - 5.20 MIL/uL   Hemoglobin 12.4 12.0 - 16.0 g/dL   HCT 34.4 (L) 35.0 - 47.0 %   MCV 83.8 80.0 - 100.0 fL   MCH 30.3 26.0 - 34.0 pg   MCHC 36.2 (H) 32.0 - 36.0 g/dL   RDW 14.8 (H) 11.5 - 14.5 %   Platelets 230 150 - 440 K/uL  Type and screen     Status: None   Collection Time: 11/17/17  9:18 PM  Result Value Ref Range   ABO/RH(D) O POS    Antibody Screen NEG    Sample Expiration      11/20/2017 Performed at [facility], [address], [place], [place] [address]     Pertinent Results:  Prenatal Labs: Blood type/Rh O+  Antibody screen neg  Rubella  Immune  Varicella Immune  RPR NR  HBsAg Neg  HIV NR  GC neg  Chlamydia neg  1 hour GTT 104  3 hour GTT n/a  GBS unknown   FHT: FHR: 140 bpm, variability: moderate,  accelerations:  Present,  decelerations:  Absent Category/reactivity:  Category I TOCO: occasional contractions    Ct Angio Chest Pe W Or Wo Contrast  Result Date: 11/15/2017 CLINICAL DATA:  Hemoptysis.  Pneumonia.  Thirty-five weeks pregnant. EXAM: CT ANGIOGRAPHY CHEST WITH CONTRAST TECHNIQUE: Multidetector CT imaging of the chest was performed using the standard protocol during bolus administration of intravenous contrast. Multiplanar CT image reconstructions and MIPs were obtained to evaluate the vascular anatomy. CONTRAST:  12mL ISOVUE-370 IOPAMIDOL (ISOVUE-370) INJECTION 76% COMPARISON:  None. FINDINGS: Cardiovascular: There is no filling defect in the pulmonary arterial tree to suggest  acute pulmonary thromboembolism. No evidence of aortic aneurysm. Great vessels are grossly patent. Mediastinum/Nodes: No abnormal mediastinal adenopathy. Esophagus is unremarkable. No pericardial effusion. Lungs/Pleura: There is a large left lower lobe mass extending from the left hilum to the left lung base measuring 3.3 x 4.7 x 3.3 cm worrisome for malignancy. It is lobulated and somewhat low density. There is a polypoid element projecting into the left lower lobe lobar airway. There is also occlusion of the left lower lobe bronchus. No pneumothorax.  No pleural effusion. Upper Abdomen: The mass extends through the diaphragm and appears to invade the spleen. Musculoskeletal: No vertebral compression deformity. Review of the MIP images confirms the above findings. IMPRESSION: Large left lower lobe lung mass extending through the diaphragm in and invading the spleen associated with occlusion of the left lower lobe bronchus. Findings are worrisome for malignancy. PET-CT is recommended. No evidence of pulmonary thromboembolism Electronically Signed   By: Marybelle Killings M.D.   On: 11/15/2017 17:40    Assessment:  Patricia Glenn is a 32 y.o. G4P2 female at [redacted]w[redacted]d with left lung mass present for induction of labor.   Plan:  1. Admit to Labor & Delivery; consents reviewed and obtained  2. Fetal Well being  - Fetal Tracing: category I - GBS unknown. Plan to treat prophylactically with Penicillin once labor starts.  - S/P betamethasone x2 due to late preterm status - Presentation: cephalic confirmed by Leopold's   3. Routine OB: - Prenatal labs reviewed, as above - Rh positive - CBC & T&S on admit - Clear fluids, IVF  4. Induction of Labor -  Contractions to be monitored with external toco in place -  Pelvis proven to [place] for induction with misoprostol -  Plan for continuous fetal monitoring  -  Maternal pain control as desired: IVPM, nitrous, regional anesthesia -  Anticipate vaginal  delivery  5. Post Partum Planning: - Infant feeding: breastfeeding - Contraception: BTL  Lisette Grinder, CNM 11/17/2017 10:40 PM ----- Lisette Grinder Certified Nurse Midwife [facility], Department of [facility]

## 2017-11-17 NOTE — Progress Notes (Signed)
  Oncology Nurse Navigator Documentation  Navigator Location: [facility]Med Onc (11/16/17 1600) Referral date to RadOnc/MedOnc: 11/16/17 (11/16/17 1600) )Navigator Encounter Type: Introductory phone call (11/16/17 1600)   Abnormal Finding Date: 11/15/17 (11/16/17 1600)                   Treatment Phase: Abnormal Scans (11/16/17 1600) Barriers/Navigation Needs: Coordination of Care (11/16/17 1600)   Interventions: Coordination of Care (11/16/17 1600)   Coordination of Care: Appts (11/16/17 1600)        Acuity: Level 2 (11/16/17 1600)   Acuity Level 2: Initial guidance, education and coordination as needed;Educational needs;Assistance expediting appointments (11/16/17 1600)    phone call made to patient to give appt details for scheduled appt on 5/31 at 3pm with Dr. Rogue Bussing. Pt introduced to navigator services. All questions answered at the time of call. Contact info given and instructed to call with any further questions or needs. Pt verbalized understanding. Will follow up with pt at appt scheduled with Dr. Jacinto Reap on 5/31 at 3pm. Nothing further needed at this time.  Time Spent with Patient: 30 (11/16/17 1600)

## 2017-11-18 ENCOUNTER — Encounter: Payer: Self-pay | Admitting: *Deleted

## 2017-11-18 ENCOUNTER — Inpatient Hospital Stay: Payer: Managed Care, Other (non HMO) | Admitting: Anesthesiology

## 2017-11-18 MED ORDER — FENTANYL 2.5 MCG/ML W/ROPIVACAINE 0.15% IN NS 100 ML EPIDURAL (ARMC)
EPIDURAL | Status: DC | PRN
  Administered 2017-11-18: 12 mL/h via EPIDURAL

## 2017-11-18 MED ORDER — ACETAMINOPHEN 325 MG PO TABS
650.0000 mg | ORAL_TABLET | ORAL | Status: DC | PRN
  Administered 2017-11-18: 650 mg via ORAL

## 2017-11-18 MED ORDER — LIDOCAINE HCL (PF) 1 % IJ SOLN
INTRAMUSCULAR | Status: DC | PRN
  Administered 2017-11-18: 5 mL via INTRADERMAL

## 2017-11-18 MED ORDER — LIDOCAINE-EPINEPHRINE (PF) 1.5 %-1:200000 IJ SOLN
INTRAMUSCULAR | Status: DC | PRN
  Administered 2017-11-18: 5 mL via EPIDURAL

## 2017-11-18 MED ORDER — LIDOCAINE HCL (PF) 1 % IJ SOLN: INTRAMUSCULAR | Status: DC | PRN

## 2017-11-18 MED ORDER — OXYTOCIN 40 UNITS IN LACTATED RINGERS INFUSION - SIMPLE MED
1.0000 m[IU]/min | INTRAVENOUS | Status: DC
  Administered 2017-11-18: 1 m[IU]/min via INTRAVENOUS
  Filled 2017-11-18: qty 1000

## 2017-11-18 MED ORDER — FENTANYL 2.5 MCG/ML W/ROPIVACAINE 0.15% IN NS 100 ML EPIDURAL (ARMC)
EPIDURAL | Status: AC
  Filled 2017-11-18: qty 100

## 2017-11-18 NOTE — Progress Notes (Signed)
Patient ID: Patricia Glenn, female   DOB: May 08, 1986, 32 y.o.   MRN: [id number] Assuming care . IOL for lung mass. Pt s last cytotec at 0605  reassuring fetal monitoring  Cont cytotec

## 2017-11-18 NOTE — Progress Notes (Signed)
Aniqua Briere is a 32 y.o. G3P2 at [redacted]w[redacted]d  Subjective: Mild ctx pain   Objective: BP 115/62   Pulse 85   Temp 98.1 F (36.7 C) (Oral)   Resp 16   Ht 5\' 7"  (1.702 m)   Wt 103.9 kg (229 lb)   LMP 03/13/2017   BMI 35.87 kg/m  No intake/output data recorded. No intake/output data recorded.  FHT:  FHR: 140 bpm, variability: moderate,  accelerations:  Present,  decelerations:  Absent UC:   regular, every 2 minutes SVE:   Dilation: 2 Effacement (%): 60 Station: -3 Exam by:: L. Elks RN tjs exam AROM  Blood tinge . 2/70/-2 vtx  Labs: Lab Results  Component Value Date   WBC 12.2 (H) 11/17/2017   HGB 12.4 11/17/2017   HCT 34.4 (L) 11/17/2017   MCV 83.8 11/17/2017   PLT 230 11/17/2017    Assessment / Plan: Spontaneous labor, progressing normally Continue to monitor and assess for Pitocin need  Gwen Her Klaira Pesci 11/18/2017, 3:26 PM

## 2017-11-18 NOTE — Progress Notes (Signed)
Labor Progress Note  Patricia Glenn is a 32 y.o. G3P2 at [redacted]w[redacted]d by LMP c/w [redacted]w[redacted]d u/s admitted for induction of labor due to lung mass concerning for malignancy.  Subjective:  Patient resting comfortably in bed, husband at the bedside. Reports that she is feeling contractions, "they feel like period cramps."  Objective: BP (!) 119/58   Pulse 92   Temp 98 F (36.7 C) (Oral)   Resp 18   Ht 5\' 7"  (1.702 m)   Wt 103.9 kg (229 lb)   LMP 03/13/2017   BMI 35.87 kg/m   Fetal Assessment: FHT:  FHR: 130 bpm, variability: moderate,  accelerations:  Present,  decelerations:  Absent Category/reactivity:  Category I UC:   regular, every 2-4 minutes SVE:   At 0605 by RN Marisa Sprinkles Dilation: 1cm  Effacement: Long  Consistency: soft  Membrane status: intact Amniotic color: n/a  Labs: Lab Results  Component Value Date   WBC 12.2 (H) 11/17/2017   HGB 12.4 11/17/2017   HCT 34.4 (L) 11/17/2017   MCV 83.8 11/17/2017   PLT 230 11/17/2017    Assessment / Plan: Induction of labor due to lung mass, latent labor  Induction of Labor: s/p 3 doses of misoprostol, mild contractions Fetal Wellbeing:  Category I Pain Control:  Labor support without medications Anticipated MOD:  NSVD  Lisette Grinder, CNM 11/18/2017, 7:03 AM

## 2017-11-18 NOTE — Anesthesia Preprocedure Evaluation (Addendum)
Anesthesia Evaluation  Patient identified by MRN, date of birth, ID band Patient awake    Reviewed: Allergy & Precautions, H&P , NPO status , reviewed documented beta blocker date and time   Airway Mallampati: II  TM Distance: >3 FB Neck ROM: full    Dental  (+) Teeth Intact   Pulmonary neg pulmonary ROS, pneumonia, resolved,    Pulmonary exam normal        Cardiovascular negative cardio ROS Normal cardiovascular exam     Neuro/Psych negative neurological ROS  negative psych ROS   GI/Hepatic negative GI ROS, Neg liver ROS,   Endo/Other  negative endocrine ROS  Renal/GU      Musculoskeletal   Abdominal   Peds  Hematology negative hematology ROS (+)   Anesthesia Other Findings History reviewed. Prior epidural x 2, no problems  History reviewed. No pertinent surgical history.  BMI    Body Mass Index:  35.87 kg/m      Reproductive/Obstetrics                            Anesthesia Physical Anesthesia Plan  ASA: II and emergent  Anesthesia Plan: Epidural   Post-op Pain Management:    Induction:   PONV Risk Score and Plan: Treatment may vary due to age or medical condition  Airway Management Planned:   Additional Equipment:   Intra-op Plan:   Post-operative Plan:   Informed Consent: I have reviewed the patients History and Physical, chart, labs and discussed the procedure including the risks, benefits and alternatives for the proposed anesthesia with the patient or authorized representative who has indicated his/her understanding and acceptance.   Dental Advisory Given  Plan Discussed with: CRNA  Anesthesia Plan Comments:         Anesthesia Quick Evaluation

## 2017-11-18 NOTE — Anesthesia Procedure Notes (Signed)
Epidural Patient location during procedure: OB Start time: 11/18/2017 6:45 PM  Staffing Anesthesiologist: Alphonsus Sias, MD Performed: anesthesiologist   Preanesthetic Checklist Completed: patient identified, site marked, surgical consent, pre-op evaluation, timeout performed, IV checked, risks and benefits discussed and monitors and equipment checked  Epidural Patient position: sitting Prep: ChloraPrep Patient monitoring: heart rate, cardiac monitor, continuous pulse ox and blood pressure Approach: right paramedian Location: L3-L4 Injection technique: LOR air  Needle:  Needle type: Tuohy  Needle gauge: 17 G Needle length: 9 cm Needle insertion depth: 8 cm Catheter type: closed end flexible Catheter size: 19 Gauge Catheter at skin depth: 13 cm Test dose: 1.5% lidocaine with Epi 1:200 K  Assessment Sensory level: T10  Additional Notes Difficult placement, +CSF on first attempt. Needle withdrawn and repositioned on 2nd attempt. Neg CSF, test dose and small bolus with normal response (ie did not appear to be intrathecal). Discussed possibility of PDPH, will contact anesthesia staff if needed.

## 2017-11-19 LAB — CBC
HEMATOCRIT: 32.4 % — AB (ref 35.0–47.0)
HEMOGLOBIN: 11.5 g/dL — AB (ref 12.0–16.0)
MCH: 29.9 pg (ref 26.0–34.0)
MCHC: 35.4 g/dL (ref 32.0–36.0)
MCV: 84.6 fL (ref 80.0–100.0)
Platelets: 217 10*3/uL (ref 150–440)
RBC: 3.83 MIL/uL (ref 3.80–5.20)
RDW: 14.9 % — ABNORMAL HIGH (ref 11.5–14.5)
WBC: 11.5 10*3/uL — AB (ref 3.6–11.0)

## 2017-11-19 LAB — RPR: RPR: NONREACTIVE

## 2017-11-19 MED ORDER — IBUPROFEN 600 MG PO TABS
600.0000 mg | ORAL_TABLET | Freq: Four times a day (QID) | ORAL | Status: DC
  Administered 2017-11-19 (×4): 600 mg via ORAL
  Filled 2017-11-19 (×4): qty 1

## 2017-11-19 MED ORDER — DIBUCAINE 1 % RE OINT: 1.0000 "application " | TOPICAL_OINTMENT | RECTAL | Status: DC | PRN

## 2017-11-19 MED ORDER — BENZOCAINE-MENTHOL 20-0.5 % EX AERO: 1.0000 "application " | INHALATION_SPRAY | CUTANEOUS | Status: DC | PRN

## 2017-11-19 MED ORDER — ONDANSETRON HCL 4 MG PO TABS: 4.0000 mg | ORAL_TABLET | ORAL | Status: DC | PRN

## 2017-11-19 MED ORDER — SENNOSIDES-DOCUSATE SODIUM 8.6-50 MG PO TABS: 2.0000 | ORAL_TABLET | ORAL | Status: DC

## 2017-11-19 MED ORDER — WITCH HAZEL-GLYCERIN EX PADS: 1.0000 "application " | MEDICATED_PAD | CUTANEOUS | Status: DC | PRN

## 2017-11-19 MED ORDER — MAGNESIUM HYDROXIDE 400 MG/5ML PO SUSP: 30.0000 mL | ORAL | Status: DC | PRN

## 2017-11-19 MED ORDER — MEASLES, MUMPS & RUBELLA VAC ~~LOC~~ INJ
0.5000 mL | INJECTION | Freq: Once | SUBCUTANEOUS | Status: DC
  Filled 2017-11-19: qty 0.5

## 2017-11-19 MED ORDER — BENZOCAINE-MENTHOL 20-0.5 % EX AERO: 1.0000 "application " | INHALATION_SPRAY | CUTANEOUS | 1 refills | Status: DC | PRN

## 2017-11-19 MED ORDER — SIMETHICONE 80 MG PO CHEW
80.0000 mg | CHEWABLE_TABLET | ORAL | Status: DC | PRN
Start: 2017-11-19 — End: 2017-11-19

## 2017-11-19 MED ORDER — ZOLPIDEM TARTRATE 5 MG PO TABS: 5.0000 mg | ORAL_TABLET | Freq: Every evening | ORAL | Status: DC | PRN

## 2017-11-19 MED ORDER — PRENATAL MULTIVITAMIN CH
1.0000 | ORAL_TABLET | Freq: Every day | ORAL | Status: DC
  Administered 2017-11-19: 1 via ORAL
  Filled 2017-11-19: qty 1

## 2017-11-19 MED ORDER — DIPHENHYDRAMINE HCL 25 MG PO CAPS: 25.0000 mg | ORAL_CAPSULE | Freq: Four times a day (QID) | ORAL | Status: DC | PRN

## 2017-11-19 MED ORDER — CEFUROXIME AXETIL 500 MG PO TABS: 500.0000 mg | ORAL_TABLET | Freq: Two times a day (BID) | ORAL | 0 refills | Status: DC

## 2017-11-19 MED ORDER — SENNOSIDES-DOCUSATE SODIUM 8.6-50 MG PO TABS
2.0000 | ORAL_TABLET | ORAL | Status: DC
  Administered 2017-11-19: 2 via ORAL
  Filled 2017-11-19: qty 2

## 2017-11-19 MED ORDER — COCONUT OIL OIL
1.0000 | TOPICAL_OIL | Status: DC | PRN
Start: 2017-11-19 — End: 2017-11-19

## 2017-11-19 MED ORDER — IBUPROFEN 600 MG PO TABS: 600.0000 mg | ORAL_TABLET | Freq: Four times a day (QID) | ORAL | 0 refills | Status: DC

## 2017-11-19 MED ORDER — FERROUS SULFATE 325 (65 FE) MG PO TABS
325.0000 mg | ORAL_TABLET | Freq: Two times a day (BID) | ORAL | Status: DC
  Administered 2017-11-19 (×2): 325 mg via ORAL
  Filled 2017-11-19 (×2): qty 1

## 2017-11-19 MED ORDER — ONDANSETRON HCL 4 MG/2ML IJ SOLN: 4.0000 mg | INTRAMUSCULAR | Status: DC | PRN

## 2017-11-19 NOTE — Progress Notes (Signed)
Discharge orders put in for patient. Patient turns 24 hours post delivery at 20:58 tonight. Infant born at 20 weeks and 5 days and will have to remain a patient at least until tomorrow. RN spoke with provider, Dr. Ouida Sills, to confirm discharge for patient. Provider states that patient can be discharged tonight and room in with infant. RN spoke with patient who states that provider told her she would be discharged tomorrow, however, if he has already put in discharge orders for her today, she is ok with this plan. RN explained rooming in process and information. Patient verbalizes understanding. Patient to watch period of purple cry and CPR videos prior to discharge.

## 2017-11-19 NOTE — Anesthesia Postprocedure Evaluation (Signed)
Anesthesia Post Note  Patient: Patricia Glenn  Procedure(s) Performed: AN AD [place]  Patient location during evaluation: Mother Baby Anesthesia Type: Epidural Level of consciousness: awake and alert Pain management: pain level controlled Vital Signs Assessment: post-procedure vital signs reviewed and stable Respiratory status: spontaneous breathing, nonlabored ventilation and respiratory function stable Cardiovascular status: stable Postop Assessment: no headache and no backache Comments: Documented concern for dural puncture with first epidural placement attempt.  Patient asymptomatic right now sitting up 90 degrees in bed.  Patient and nursing staff instructed to let our team know if patient develops a new headache.  Our team will continue to monitor.     Last Vitals:  Vitals:   11/19/17 0428 11/19/17 0800  BP: 118/73 117/74  Pulse: 98 83  Resp:    Temp: 36.5 C 36.6 C  SpO2: 100% 99%    Last Pain:  Vitals:   11/19/17 0800  TempSrc: Oral  PainSc:                  Precious Haws Piscitello

## 2017-11-19 NOTE — Discharge Summary (Signed)
Obstetric Discharge Summary   Patient ID: Patient Name: Patricia Glenn DOB: 11-Aug-1985 MRN: [id number]  Date of Admission: 11/17/2017 Date of Delivery: 11/18/17 Delivered by: Ouida Sills, MD Date of Discharge: 11/19/2017  Primary OB: [facility]  FAO:[id number] last menstrual period was 03/13/2017. EDC Estimated Date of Delivery: 12/18/17 Gestational Age at Delivery: [redacted]w[redacted]d   Antepartum complications: LLL mass  Admitting Diagnosis: Lung mass , concerning for malignancy  Work up underway . Premature IOL to expedite workup and tx   Secondary Diagnoses: Patient Active Problem List   Diagnosis Date Noted  . Mass of lower lobe of left lung 11/17/2017  . Encounter for induction of labor 11/17/2017  . Pneumonia complicating pregnancy, second trimester 08/29/2017  . Encounter for supervision of other normal pregnancy, unspecified trimester 05/23/2017    Augmentation: Pitocin and Cytotec Complications: None Intrapartum complications/course:  Delivery Type: spontaneous vaginal delivery Anesthesia: epidural Placenta: sponatneous Laceration:  Episiotomy: none  Newborn Data: Live born female  Birth Weight: 5 lb 13.1 oz (2640 g) APGAR: 8, 9  Newborn Delivery   Birth date/time:  11/18/2017 20:58:00 Delivery type:  Vaginal, Spontaneous         Postpartum Course  Patient had an uncomplicated postpartum course.  By time of discharge on PPD#1, her pain was controlled on oral pain medications; she had appropriate lochia and was ambulating, voiding without difficulty and tolerating regular diet.  She was deemed stable for discharge to home.       Labs: CBC Latest Ref Rng & Units 11/19/2017 11/17/2017 08/31/2017  WBC 3.6 - 11.0 K/uL 11.5(H) 12.2(H) 11.9(H)  Hemoglobin 12.0 - 16.0 g/dL 11.5(L) 12.4 11.4(L)  Hematocrit 35.0 - 47.0 % 32.4(L) 34.4(L) 34.0(L)  Platelets 150 - 440 K/uL 217 230 256   O POS  Physical exam:  BP 117/74 (BP Location: Left Arm)   Pulse 83   Temp 97.9  F (36.6 C) (Oral)   Resp 18   Ht 5\' 7"  (1.702 m)   Wt 103.9 kg (229 lb)   LMP 03/13/2017   SpO2 99%   Breastfeeding? Unknown   BMI 35.87 kg/m  General: alert and no distress Pulm: normal respiratory effort Lochia: appropriate Abdomen: soft, NT Uterine Fundus: firm, below umbilicus  Extremities: No evidence of DVT seen on physical exam. No lower extremity edema.   Disposition: stable, discharge to home Baby Feeding: breastmilk Baby Disposition: home with mom  Contraception: unsure  Prenatal Labs:  Blood type/Rh O+  Antibody screen neg  Rubella Immune  Varicella Immune  RPR NR  HBsAg Neg  HIV NR  GC neg  Chlamydia neg  1 hour GTT 104  3 hour GTT n/a  GBS unknown   txed for GBS        Rh Immune globulin given: n/a  Plan:  Patricia Glenn was discharged to home in good condition. Follow-up appointment at [facility] with delivery provider in 6 weeks She is scheduled to f/up with Dr Jamal Collin pulmonologist for bronchoscopy and tissue bx . Oncology is involved   Discharge Instructions: Per After Visit Summary. Activity: Advance as tolerated. Pelvic rest for 6 weeks.   Diet: Regular Discharge Medications: Allergies as of 11/19/2017   No Known Allergies     Medication List    TAKE these medications   benzocaine-Menthol 20-0.5 % Aero Commonly known as:  DERMOPLAST Apply 1 application topically as needed for irritation (perineal discomfort).   cefUROXime 500 MG tablet Commonly known as:  CEFTIN Take 1 tablet (500 mg total) by  mouth 2 (two) times daily.   ibuprofen 600 MG tablet Commonly known as:  ADVIL,MOTRIN Take 1 tablet (600 mg total) by mouth every 6 (six) hours.   PRENATAL 28-0.8 MG Tabs Take 1 tablet by mouth daily.      Outpatient follow up:  Follow-up Information    Schermerhorn, Gwen Her, MD Follow up in 6 week(s).   Specialty:  Obstetrics and Gynecology Contact information: [address] [place] [address] [phone]            Signed:  Gwen Her Schermerhorn

## 2017-11-19 NOTE — Lactation Note (Signed)
This note was copied from a baby's chart. Attempted breastfeeding but infant too sleepy to latch.  Mom has her own Medela pump.  Pumped x 15 mins and received 86ml of colostrum.  Syringe fed 60ml while infant sucked on finger; however she got sleepy very quickly.  Switched to slow flow nipple and infant sucked a little, took 24ml more.  Infant had a large amount of emesis immediately after feeding however.  Discussed plan of care with Mom's RN, will allow infant to rest for an hour or so and then can attempt another feeding.

## 2017-11-19 NOTE — Progress Notes (Addendum)
Discharge instructions given. Patient verbalizes understanding of teaching. Infant to remain a patient. RN explained to patient that she would be rooming in with infant although she would no longer be a patient, she would be responsible for her pain medication, and that she would still be provided food. Pads, underwear, ice packs, towels/washcloths, etc to be given to patient prior to discharge. Patient verbalizes understanding.  Period of Purple Cry and CPR videos shown.

## 2017-11-20 NOTE — Anesthesia Postprocedure Evaluation (Signed)
Anesthesia Post Note  Patient: Patricia Glenn  Procedure(s) Performed: AN AD [place]  Patient location during evaluation: Mother Baby Anesthesia Type: Epidural Level of consciousness: awake and alert Pain management: pain level controlled Vital Signs Assessment: post-procedure vital signs reviewed and stable Respiratory status: spontaneous breathing, nonlabored ventilation and respiratory function stable Cardiovascular status: stable Postop Assessment: no headache, no backache and epidural receding Anesthetic complications: no Comments: Pt doing very well. No evidence of PDPH. No other c/o     Last Vitals: There were no vitals filed for this visit.  Last Pain: There were no vitals filed for this visit.               Alphonsus Sias

## 2017-11-21 ENCOUNTER — Ambulatory Visit (INDEPENDENT_AMBULATORY_CARE_PROVIDER_SITE_OTHER): Payer: Managed Care, Other (non HMO) | Admitting: Pulmonary Disease

## 2017-11-21 ENCOUNTER — Encounter: Payer: Self-pay | Admitting: Pulmonary Disease

## 2017-11-21 ENCOUNTER — Telehealth: Payer: Self-pay | Admitting: *Deleted

## 2017-11-21 VITALS — BP 134/84 | HR 101 | Ht 67.0 in | Wt 220.0 lb

## 2017-11-21 DIAGNOSIS — Z3490 Encounter for supervision of normal pregnancy, unspecified, unspecified trimester: Secondary | ICD-10-CM

## 2017-11-21 DIAGNOSIS — D1432 Benign neoplasm of left bronchus and lung: Secondary | ICD-10-CM | POA: Diagnosis not present

## 2017-11-21 DIAGNOSIS — J189 Pneumonia, unspecified organism: Secondary | ICD-10-CM

## 2017-11-21 DIAGNOSIS — J181 Lobar pneumonia, unspecified organism: Secondary | ICD-10-CM | POA: Diagnosis not present

## 2017-11-21 NOTE — Progress Notes (Signed)
PULMONARY CONSULT NOTE  Requesting MD/Service: Rogue Bussing Date of initial consultation: 11/21/17 Reason for consultation: Obstructing left lower lobe lung mass with postobstructive changes  PT PROFILE: 32 y.o. female never smoker with new finding of left lower lobe mass and postobstructive changes  DATA: CT chest 11/15/2017:  There is a large left lower lobe mass extending from the left hilum to the left lung base measuring 3.3 x 4.7 x 3.3 cm worrisome for malignancy. It is lobulated and somewhat low density. There is a polypoid element projecting into the left lower lobe lobar airway. There is also occlusion of the left lower lobe bronchus.  INTERVAL:  HPI:  32 year old female who just delivered her third child last week after induction.  The delivery was induced due to recent findings on CT of her chest.  Her history is as follows.  She reports being diagnosed with pneumonia in March 2018.  She was treated for this and recovered.  I do not have films to review from that time.  In March of this year she developed fevers, chills, left-sided posterior pleuritic chest pain.  She was hospitalized (under the OB service) for 3 days with treatment consisting of intravenous antibiotics.  After treatment, her symptoms improved dramatically.  A follow-up chest x-ray was performed to 4 weeks after that hospitalization and revealed persistent opacity in the left lower lobe.  A subsequent chest x-ray 2 weeks later again revealed persistent left lower lobe opacity.  Last week, she developed scant hemoptysis was evaluated by Dr. Raul Del who ordered a CT scan of the chest with findings documented above.  Her case was discussed at oncology conference last week and arrangements were made for her to see me for consideration of bronchoscopy.  At the present time, she denies dyspnea, fever, chest pain, purulent sputum, hemoptysis.  She has recovered well from recent pregnancy.  Her baby is healthy and well.    She has  no significant PMH or surgical history except as identified above.   MEDICATIONS: I have reviewed all medications and confirmed regimen as documented  Social History   Socioeconomic History  . Marital status: Married    Spouse name: Not on file  . Number of children: Not on file  . Years of education: Not on file  . Highest education level: Not on file  Occupational History  . Not on file  Social Needs  . Financial resource strain: Not on file  . Food insecurity:    Worry: Not on file    Inability: Not on file  . Transportation needs:    Medical: Not on file    Non-medical: Not on file  Tobacco Use  . Smoking status: Never Smoker  . Smokeless tobacco: Never Used  Substance and Sexual Activity  . Alcohol use: No    Frequency: Never  . Drug use: No  . Sexual activity: Not on file  Lifestyle  . Physical activity:    Days per week: Not on file    Minutes per session: Not on file  . Stress: Not on file  Relationships  . Social connections:    Talks on phone: Not on file    Gets together: Not on file    Attends religious service: Not on file    Active member of club or organization: Not on file    Attends meetings of clubs or organizations: Not on file    Relationship status: Not on file  . Intimate partner violence:    Fear of current  or ex partner: Not on file    Emotionally abused: Not on file    Physically abused: Not on file    Forced sexual activity: Not on file  Other Topics Concern  . Not on file  Social History Narrative    stay home mom-; lives at home; 8 miles from here; no smoking; no second smoking; alcohol ocassional; 2 daughters.     Family History  Problem Relation Age of Onset  . Bladder Cancer Maternal Grandfather 80    ROS: No fever, myalgias/arthralgias, unexplained weight loss or weight gain No new focal weakness or sensory deficits No otalgia, hearing loss, visual changes, nasal and sinus symptoms, mouth and throat problems No neck pain  or adenopathy No abdominal pain, N/V/D, diarrhea, change in bowel pattern No dysuria, change in urinary pattern   Vitals:   11/21/17 0852 11/21/17 0858  BP:  134/84  Pulse:  (!) 101  SpO2:  100%  Weight: 220 lb (99.8 kg)   Height: 5\' 7"  (1.702 m)      EXAM:  Gen: Mildly obese, No overt respiratory distress HEENT: NCAT, sclera white, oropharynx normal Neck: Supple without LAN, thyromegaly, JVD Lungs: breath sounds diminished in left base, otherwise normal Cardiovascular: RRR, no murmurs noted Abdomen: Soft, nontender, normal BS Ext: without clubbing, cyanosis, edema Neuro: CNs grossly intact, motor and sensory intact Skin: Limited exam, no lesions noted  DATA:   No flowsheet data found.  CBC Latest Ref Rng & Units 11/19/2017 11/17/2017 08/31/2017  WBC 3.6 - 11.0 K/uL 11.5(H) 12.2(H) 11.9(H)  Hemoglobin 12.0 - 16.0 g/dL 11.5(L) 12.4 11.4(L)  Hematocrit 35.0 - 47.0 % 32.4(L) 34.4(L) 34.0(L)  Platelets 150 - 440 K/uL 217 230 256    CXR: Plain films of the chest are not available for my review  I have personally reviewed all chest radiographs reported above including CXRs and CT chest unless otherwise indicated  IMPRESSION:     ICD-10-CM   1. 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 Recent pregnancy Z34.90   3. Recent LLL postobstructive pneumonia  J18.1    The official CT scan report not withstanding, I do not believe that this tumor extends across the diaphragm and invades the spleen.  I think more likely this is proximal left lower lobe tumor with an endobronchial component and postobstructive changes in the left lower lobe.  In a non-smoking 32 year old woman, the hope would be that this is a benign tumor such as carcinoid.  She has no symptoms to suggest carcinoid syndrome.  PLAN:  We have arranged for bronchoscopy scheduled for 11/29/2017.  I discussed the procedure in detail with her.  We reviewed the films together.  We discussed alternatives (noting that there are not  really any good alternatives to bronchoscopy given the CT scan findings), risks, etc.  All of her questions were answered.  I have not scheduled follow-up with her in the office but will contact her once the results of the bronchoscopy are available to me and arrange follow-up as needed.   Merton Border, MD PCCM service Mobile [phone] Pager [phone] 11/22/2017 4:02 PM

## 2017-11-21 NOTE — H&P (View-Only) (Signed)
PULMONARY CONSULT NOTE  Requesting MD/Service: Rogue Bussing Date of initial consultation: 11/21/17 Reason for consultation: Obstructing left lower lobe lung mass with postobstructive changes  PT PROFILE: 32 y.o. female never smoker with new finding of left lower lobe mass and postobstructive changes  DATA: CT chest 11/15/2017:  There is a large left lower lobe mass extending from the left hilum to the left lung base measuring 3.3 x 4.7 x 3.3 cm worrisome for malignancy. It is lobulated and somewhat low density. There is a polypoid element projecting into the left lower lobe lobar airway. There is also occlusion of the left lower lobe bronchus.  INTERVAL:  HPI:  32 year old female who just delivered her third child last week after induction.  The delivery was induced due to recent findings on CT of her chest.  Her history is as follows.  She reports being diagnosed with pneumonia in March 2018.  She was treated for this and recovered.  I do not have films to review from that time.  In March of this year she developed fevers, chills, left-sided posterior pleuritic chest pain.  She was hospitalized (under the OB service) for 3 days with treatment consisting of intravenous antibiotics.  After treatment, her symptoms improved dramatically.  A follow-up chest x-ray was performed to 4 weeks after that hospitalization and revealed persistent opacity in the left lower lobe.  A subsequent chest x-ray 2 weeks later again revealed persistent left lower lobe opacity.  Last week, she developed scant hemoptysis was evaluated by Dr. Raul Del who ordered a CT scan of the chest with findings documented above.  Her case was discussed at oncology conference last week and arrangements were made for her to see me for consideration of bronchoscopy.  At the present time, she denies dyspnea, fever, chest pain, purulent sputum, hemoptysis.  She has recovered well from recent pregnancy.  Her baby is healthy and well.    She has  no significant PMH or surgical history except as identified above.   MEDICATIONS: I have reviewed all medications and confirmed regimen as documented  Social History   Socioeconomic History  . Marital status: Married    Spouse name: Not on file  . Number of children: Not on file  . Years of education: Not on file  . Highest education level: Not on file  Occupational History  . Not on file  Social Needs  . Financial resource strain: Not on file  . Food insecurity:    Worry: Not on file    Inability: Not on file  . Transportation needs:    Medical: Not on file    Non-medical: Not on file  Tobacco Use  . Smoking status: Never Smoker  . Smokeless tobacco: Never Used  Substance and Sexual Activity  . Alcohol use: No    Frequency: Never  . Drug use: No  . Sexual activity: Not on file  Lifestyle  . Physical activity:    Days per week: Not on file    Minutes per session: Not on file  . Stress: Not on file  Relationships  . Social connections:    Talks on phone: Not on file    Gets together: Not on file    Attends religious service: Not on file    Active member of club or organization: Not on file    Attends meetings of clubs or organizations: Not on file    Relationship status: Not on file  . Intimate partner violence:    Fear of current  or ex partner: Not on file    Emotionally abused: Not on file    Physically abused: Not on file    Forced sexual activity: Not on file  Other Topics Concern  . Not on file  Social History Narrative    stay home mom-; lives at home; 8 miles from here; no smoking; no second smoking; alcohol ocassional; 2 daughters.     Family History  Problem Relation Age of Onset  . Bladder Cancer Maternal Grandfather 80    ROS: No fever, myalgias/arthralgias, unexplained weight loss or weight gain No new focal weakness or sensory deficits No otalgia, hearing loss, visual changes, nasal and sinus symptoms, mouth and throat problems No neck pain  or adenopathy No abdominal pain, N/V/D, diarrhea, change in bowel pattern No dysuria, change in urinary pattern   Vitals:   11/21/17 0852 11/21/17 0858  BP:  134/84  Pulse:  (!) 101  SpO2:  100%  Weight: 220 lb (99.8 kg)   Height: 5\' 7"  (1.702 m)      EXAM:  Gen: Mildly obese, No overt respiratory distress HEENT: NCAT, sclera white, oropharynx normal Neck: Supple without LAN, thyromegaly, JVD Lungs: breath sounds diminished in left base, otherwise normal Cardiovascular: RRR, no murmurs noted Abdomen: Soft, nontender, normal BS Ext: without clubbing, cyanosis, edema Neuro: CNs grossly intact, motor and sensory intact Skin: Limited exam, no lesions noted  DATA:   No flowsheet data found.  CBC Latest Ref Rng & Units 11/19/2017 11/17/2017 08/31/2017  WBC 3.6 - 11.0 K/uL 11.5(H) 12.2(H) 11.9(H)  Hemoglobin 12.0 - 16.0 g/dL 11.5(L) 12.4 11.4(L)  Hematocrit 35.0 - 47.0 % 32.4(L) 34.4(L) 34.0(L)  Platelets 150 - 440 K/uL 217 230 256    CXR: Plain films of the chest are not available for my review  I have personally reviewed all chest radiographs reported above including CXRs and CT chest unless otherwise indicated  IMPRESSION:     ICD-10-CM   1. 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 Recent pregnancy Z34.90   3. Recent LLL postobstructive pneumonia  J18.1    The official CT scan report not withstanding, I do not believe that this tumor extends across the diaphragm and invades the spleen.  I think more likely this is proximal left lower lobe tumor with an endobronchial component and postobstructive changes in the left lower lobe.  In a non-smoking 32 year old woman, the hope would be that this is a benign tumor such as carcinoid.  She has no symptoms to suggest carcinoid syndrome.  PLAN:  We have arranged for bronchoscopy scheduled for 11/29/2017.  I discussed the procedure in detail with her.  We reviewed the films together.  We discussed alternatives (noting that there are not  really any good alternatives to bronchoscopy given the CT scan findings), risks, etc.  All of her questions were answered.  I have not scheduled follow-up with her in the office but will contact her once the results of the bronchoscopy are available to me and arrange follow-up as needed.   Merton Border, MD PCCM service Mobile [phone] Pager [phone] 11/22/2017 4:02 PM

## 2017-11-21 NOTE — Telephone Encounter (Signed)
Pt informed of date and time this am at appt.  PROVIDER: [place] PROCEDURE: Regular DATE: 11/29/17 TIME: 1pm

## 2017-11-21 NOTE — Telephone Encounter (Signed)
Gave to [place] to get authorized 11/22/17

## 2017-11-21 NOTE — Patient Instructions (Signed)
Bronchoscopy planned for next week 6/12.  I will contact you with results of bronchoscopy specimens and Dr B and I will help direct the next phases of management

## 2017-11-22 ENCOUNTER — Encounter: Payer: Self-pay | Admitting: Pulmonary Disease

## 2017-11-22 NOTE — Telephone Encounter (Signed)
Patricia Glenn stated No Prior Auth Required [phone]

## 2017-11-24 NOTE — Telephone Encounter (Signed)
Pt is having a regular bronch. Please advise on message below.

## 2017-11-24 NOTE — Telephone Encounter (Signed)
Patient wants to know which sedatives are going to be used in procedure.  She needs to consult pediatrician for how long to hold breast milk .

## 2017-11-24 NOTE — Telephone Encounter (Signed)
Typical medications are fentanyl, midazolam. Morphine is also occasionally used. I will forward this to Dr. Alva Garnet to see if there are other medications he uses during bronchoscopy.

## 2017-11-28 NOTE — Telephone Encounter (Signed)
LMOVM for pt to return call 

## 2017-11-28 NOTE — Telephone Encounter (Signed)
Informed pt of meds per DR. Nothing further needed.

## 2017-11-29 ENCOUNTER — Encounter: Payer: Self-pay | Admitting: *Deleted

## 2017-11-29 ENCOUNTER — Encounter
Admission: RE | Disposition: A | Discharge status: Home / Self Care | Payer: Self-pay | Source: Ambulatory Visit | Attending: Pulmonary Disease

## 2017-11-29 ENCOUNTER — Ambulatory Visit
Admission: RE | Admit: 2017-11-29 | Discharge: 2017-11-29 | Disposition: A | Discharge status: Home / Self Care | Payer: Managed Care, Other (non HMO) | Source: Ambulatory Visit | Attending: Pulmonary Disease | Admitting: Pulmonary Disease

## 2017-11-29 DIAGNOSIS — R918 Other nonspecific abnormal finding of lung field: Secondary | ICD-10-CM | POA: Diagnosis not present

## 2017-11-29 DIAGNOSIS — D1432 Benign neoplasm of left bronchus and lung: Secondary | ICD-10-CM | POA: Insufficient documentation

## 2017-11-29 HISTORY — PX: FLEXIBLE BRONCHOSCOPY: SHX5094

## 2017-11-29 SURGERY — BRONCHOSCOPY, FLEXIBLE
Anesthesia: Moderate Sedation

## 2017-11-29 MED ORDER — MIDAZOLAM HCL 2 MG/2ML IJ SOLN
INTRAMUSCULAR | Status: AC
  Filled 2017-11-29: qty 6

## 2017-11-29 MED ORDER — LIDOCAINE HCL 2 % EX GEL
1.0000 "application " | Freq: Once | CUTANEOUS | Status: DC
  Filled 2017-11-29: qty 5

## 2017-11-29 MED ORDER — MIDAZOLAM HCL 2 MG/2ML IJ SOLN
INTRAMUSCULAR | Status: DC | PRN
  Administered 2017-11-29 (×2): 2 mg via INTRAVENOUS

## 2017-11-29 MED ORDER — PHENYLEPHRINE HCL 0.25 % NA SOLN
1.0000 | Freq: Four times a day (QID) | NASAL | Status: DC | PRN
  Filled 2017-11-29: qty 15

## 2017-11-29 MED ORDER — FENTANYL CITRATE (PF) 100 MCG/2ML IJ SOLN
INTRAMUSCULAR | Status: AC
  Administered 2017-11-29: 25 ug
  Filled 2017-11-29: qty 4

## 2017-11-29 MED ORDER — FENTANYL CITRATE (PF) 100 MCG/2ML IJ SOLN
25.0000 ug | Freq: Two times a day (BID) | INTRAMUSCULAR | Status: DC
  Administered 2017-11-29: 25 ug via INTRAVENOUS

## 2017-11-29 MED ORDER — BUTAMBEN-TETRACAINE-BENZOCAINE 2-2-14 % EX AERO
1.0000 | INHALATION_SPRAY | Freq: Once | CUTANEOUS | Status: DC
  Filled 2017-11-29: qty 20

## 2017-11-29 NOTE — Progress Notes (Signed)
proceure finished, pt alert, vss, , will take to postop

## 2017-11-29 NOTE — Procedures (Signed)
Indication:   Obstructing LLL tumor  Premedication:   Fentanyl 50 mcg Midaz 4 mg  Anesthesia: Topical to nose and throat 40 cc of 1% lidocaine used during the course of procedure  Procedure: After adequate sedation and anesthesia, the bronchoscope was introduced via the R naris and advanced into the posterior pharynx this revealed abundant lymphoid tissue in UA. Further anesthesia was obtained with 1% lidocaine and the scope was advanced into the trachea. Complete airway anesthesia was achieved with 1% lidocaine and a thorough airway examination was performed. This revealed the following findings:  Findings:  Upper airway - abundant lymphoid tissue in UA Tracheobronchial anatomy - normal airway arrangement  Bronchial mucosa - diffusely erythematous Other - smooth shiny obstructing tumor at orifice of LLL just distal to take off of superior segment of LLL  Specimens:   Cytology brushings from LLL   Complications: No major complications but lesion bled briskly with cytology brushings so it was felt that EBBx could not be performed safely due to risk of hemorrhage  Post procedure evaluation:  The patient tolerated the procedure well with no major complications CXR - N/I   Merton Border, MD PCCM service Mobile [phone] Pager [phone] 11/29/2017 1:29 PM

## 2017-11-29 NOTE — Discharge Instructions (Signed)
Flexible Bronchoscopy, Care After These instructions give you information on caring for yourself after your procedure. Your doctor may also give you more specific instructions. Call your doctor if you have any problems or questions after your procedure. Follow these instructions at home:  Do not eat or drink anything for 2 hours after your procedure. If you try to eat or drink before the medicine wears off, food or drink could go into your lungs. You could also burn yourself.  After 2 hours have passed and when you can cough and gag normally, you may eat soft food and drink liquids slowly.  The day after the test, you may eat your normal diet.  You may do your normal activities.  Keep all doctor visits. Get help right away if:  You get more and more short of breath.  You get light-headed.  You feel like you are going to pass out (faint).  You have chest pain.  You have new problems that worry you.  You cough up more than a little blood.  You cough up more blood than before. This information is not intended to replace advice given to you by your health care provider. Make sure you discuss any questions you have with your health care provider. Document Released: 04/03/2009 Document Revised: 11/12/2015 Document Reviewed: 02/08/2013 Elsevier Interactive Patient Education  2017 [place]   1) The drugs that you were given will stay in your system until tomorrow so for the next 24 hours you should not:  A) Drive an automobile B) Make any legal decisions C) Drink any alcoholic beverage   2) You may resume regular meals tomorrow.  Today it is better to start with liquids and gradually work up to solid foods.  You may eat anything you prefer, but it is better to start with liquids, then soup and crackers, and gradually work up to solid foods.   3) Please notify your doctor immediately if you have any unusual bleeding, trouble  breathing, redness and pain at the surgery site, drainage, fever, or pain not relieved by medication.    4) Additional Instructions:        Please contact your physician with any problems or Same Day Surgery at [phone], Monday through Friday 6 am to 4 pm, or [place] at [facility] number at [phone].

## 2017-11-29 NOTE — Interval H&P Note (Signed)
History and Physical Interval Note:  11/29/2017 11:30 AM  Patricia Glenn  has presented today for surgery, with the diagnosis of LUNG MASS  The various methods of treatment have been discussed with the patient and family. After consideration of risks, benefits and other options for treatment, the patient has consented to  Procedure(s): FLEXIBLE BRONCHOSCOPY (N/A) as a surgical intervention .  The patient's history has been reviewed, patient examined, no change in status, stable for surgery.  I have reviewed the patient's chart and labs.  Questions were answered to the patient's satisfaction.     Wilhelmina Mcardle

## 2017-11-30 ENCOUNTER — Other Ambulatory Visit: Payer: Self-pay | Admitting: *Deleted

## 2017-11-30 ENCOUNTER — Encounter: Payer: Self-pay | Admitting: Pulmonary Disease

## 2017-11-30 DIAGNOSIS — R918 Other nonspecific abnormal finding of lung field: Secondary | ICD-10-CM

## 2017-11-30 NOTE — Progress Notes (Signed)
Patricia Mcardle, MD  Renelda Mom, LPN        Please schedule consultation with Dr Genevive Bi. He knows of this patient. Please let pt know that this is our plan and to expect a call from his office   Thanks   Waunita Schooner    Ref placed per DS. LMOM for pt to call back to inform her of referral placed.

## 2017-12-01 ENCOUNTER — Telehealth: Payer: Self-pay | Admitting: Pulmonary Disease

## 2017-12-01 LAB — CYTOLOGY - NON PAP

## 2017-12-01 NOTE — Telephone Encounter (Signed)
Pt informed she needs to keep appt with Dr. Tasia Catchings and Dr. Genevive Bi. Pt verbalized understanding. Nothing further needed.

## 2017-12-01 NOTE — Telephone Encounter (Signed)
Called patient to give appointment date to see Dr. Nestor Lewandowsky on [place] 12/07/17 at 2:30.  Patricia Glenn wanted to know if she still needed to keep appointment with Dr. Tasia Catchings on 12/05/17.  Please contact patient and advise.   Thank you! Rhonda J Cobb

## 2017-12-05 ENCOUNTER — Other Ambulatory Visit: Payer: Self-pay

## 2017-12-05 ENCOUNTER — Inpatient Hospital Stay: Payer: Managed Care, Other (non HMO) | Attending: Oncology | Admitting: Oncology

## 2017-12-05 ENCOUNTER — Encounter: Payer: Self-pay | Admitting: Oncology

## 2017-12-05 VITALS — BP 153/91 | HR 85 | Temp 98.2°F | Resp 18 | Wt 211.0 lb

## 2017-12-05 DIAGNOSIS — R918 Other nonspecific abnormal finding of lung field: Secondary | ICD-10-CM | POA: Insufficient documentation

## 2017-12-05 DIAGNOSIS — Z8052 Family history of malignant neoplasm of bladder: Secondary | ICD-10-CM | POA: Diagnosis not present

## 2017-12-05 NOTE — Progress Notes (Signed)
[place] CONSULT NOTE  Patient Care Team: Patient, No Pcp Per as PCP - General ([place]) Telford Nab, RN as Registered Nurse  CHIEF COMPLAINTS/PURPOSE OF CONSULTATION:  Left lower lobe lung mass    No history exists.     HISTORY OF PRESENTING ILLNESS:  Patricia Glenn 32 y.o.  female with no prior history of smoking; presented for follow-up of left lower lobe mass and discussion of biopsy results and future management plan.  I am covering Dr. B to see this patient. Patient oncology history was reviewed.  Patient was pregnant when left lower lobe mass was discovered after left lower lobe pneumonia.  CT scan of the chest showed 4 to 5 cm large mass posterior/left lower lobe with concerns of local invasion.  She delivered her baby 2 weeks ago. Patient's case was reviewed at tumor conference on May 30, patient was referred to pulmonary for biopsy via bronchoscopy Patient had biopsy through bronchoscopy on November 29, 2017, pathology showed nondiagnostic, bronchial epithelial cells.  Patient presents to discuss about pathology results and future plan. Patient otherwise denies any symptoms.  Denies any cough.  Shortness of breath.  She is accompanied by her husband to the clinic.  Review of Systems  Constitutional: Negative for chills, diaphoresis, fever, malaise/fatigue and weight loss.  HENT: Negative for congestion, ear discharge, ear pain, nosebleeds, sinus pain and sore throat.   Eyes: Negative for double vision, photophobia, pain, discharge and redness.  Respiratory: Negative for cough, hemoptysis, sputum production, shortness of breath and wheezing.   Cardiovascular: Negative for chest pain, palpitations, orthopnea, claudication and leg swelling.  Gastrointestinal: Negative for abdominal pain, blood in stool, constipation, diarrhea, heartburn, melena, nausea and vomiting.  Genitourinary: Negative for dysuria, flank pain, frequency, hematuria and urgency.   Musculoskeletal: Negative for back pain, joint pain, myalgias and neck pain.  Skin: Negative.  Negative for itching and rash.  Neurological: Negative for dizziness, tingling, tremors, focal weakness, weakness and headaches.  Endo/Heme/Allergies: Negative for environmental allergies. Does not bruise/bleed easily.  Psychiatric/Behavioral: Negative for depression and hallucinations. The patient is not nervous/anxious and does not have insomnia.      MEDICAL HISTORY:  Past Medical History:  Diagnosis Date  . Lung mass     SURGICAL HISTORY: Past Surgical History:  Procedure Laterality Date  . FLEXIBLE BRONCHOSCOPY N/A 11/29/2017   Procedure: FLEXIBLE BRONCHOSCOPY;  Surgeon: Wilhelmina Mcardle, MD;  Location: [facility] ORS;  Service: Pulmonary;  Laterality: N/A;    SOCIAL HISTORY: Social History   Socioeconomic History  . Marital status: Married    Spouse name: Not on file  . Number of children: Not on file  . Years of education: Not on file  . Highest education level: Not on file  Occupational History  . Not on file  Social Needs  . Financial resource strain: Not on file  . Food insecurity:    Worry: Not on file    Inability: Not on file  . Transportation needs:    Medical: Not on file    Non-medical: Not on file  Tobacco Use  . Smoking status: Never Smoker  . Smokeless tobacco: Never Used  Substance and Sexual Activity  . Alcohol use: No    Frequency: Never  . Drug use: No  . Sexual activity: Not on file  Lifestyle  . Physical activity:    Days per week: Not on file    Minutes per session: Not on file  . Stress: Not on file  Relationships  .  Social connections:    Talks on phone: Not on file    Gets together: Not on file    Attends religious service: Not on file    Active member of club or organization: Not on file    Attends meetings of clubs or organizations: Not on file    Relationship status: Not on file  . Intimate partner violence:    Fear of current or ex  partner: Not on file    Emotionally abused: Not on file    Physically abused: Not on file    Forced sexual activity: Not on file  Other Topics Concern  . Not on file  Social History Narrative    stay home mom-; lives at home; 8 miles from here; no smoking; no second smoking; alcohol ocassional; 2 daughters.     FAMILY HISTORY: grandpa- 80s- bladder cancer  Family History  Problem Relation Age of Onset  . Bladder Cancer Maternal Grandfather 44    ALLERGIES:  has No Known Allergies.  MEDICATIONS:  Current Outpatient Medications  Medication Sig Dispense Refill  . ibuprofen (ADVIL,MOTRIN) 600 MG tablet Take 1 tablet (600 mg total) by mouth every 6 (six) hours. 30 tablet 0  . PRENATAL 28-0.8 MG TABS Take 1 tablet by mouth daily.    . cefUROXime (CEFTIN) 500 MG tablet Take 1 tablet (500 mg total) by mouth 2 (two) times daily. (Patient not taking: Reported on 12/05/2017) 30 tablet 0   No current facility-administered medications for this visit.       Marland Kitchen  PHYSICAL EXAMINATION: ECOG PERFORMANCE STATUS: 0 - Asymptomatic  Vitals:   12/05/17 1337 12/05/17 1339  BP: (!) 153/91   Pulse: 85   Resp: 18   Temp: 98.2 F (36.8 C)   SpO2:  100%   Filed Weights   12/05/17 1337  Weight: 211 lb (95.7 kg)    Physical Exam  Constitutional: She is oriented to person, place, and time and well-developed, well-nourished, and in no distress. No distress.  HENT:  Head: Normocephalic and atraumatic.  Nose: Nose normal.  Mouth/Throat: Oropharynx is clear and moist. No oropharyngeal exudate.  Eyes: Pupils are equal, round, and reactive to light. EOM are normal. Left eye exhibits no discharge. No scleral icterus.  Neck: Normal range of motion. Neck supple. No JVD present.  Cardiovascular: Normal rate, regular rhythm and normal heart sounds.  No murmur heard. Pulmonary/Chest: Effort normal and breath sounds normal. No respiratory distress. She has no wheezes. She has no rales. She exhibits no  tenderness.  Abdominal: Soft. Bowel sounds are normal. She exhibits no distension and no mass. There is no tenderness. There is no rebound and no guarding.  Musculoskeletal: Normal range of motion. She exhibits no edema or tenderness.  Lymphadenopathy:    She has no cervical adenopathy.  Neurological: She is alert and oriented to person, place, and time. No cranial nerve deficit. She exhibits normal muscle tone. Coordination normal.  Skin: Skin is warm and dry. She is not diaphoretic. No erythema.  Skin erythematous rash  Psychiatric: Affect and judgment normal.     LABORATORY DATA:  I have reviewed the data as listed Lab Results  Component Value Date   WBC 11.5 (H) 11/19/2017   HGB 11.5 (L) 11/19/2017   HCT 32.4 (L) 11/19/2017   MCV 84.6 11/19/2017   PLT 217 11/19/2017   No results for input(s): NA, K, CL, CO2, GLUCOSE, BUN, CREATININE, CALCIUM, GFRNONAA, GFRAA, PROT, ALBUMIN, AST, ALT, ALKPHOS, BILITOT, BILIDIR, IBILI in the  last 8760 hours.  RADIOGRAPHIC STUDIES: I have personally reviewed the radiological images as listed and agreed with the findings in the report. Ct Angio Chest Pe W Or Wo Contrast  Result Date: 11/15/2017 CLINICAL DATA:  Hemoptysis.  Pneumonia.  Thirty-five weeks pregnant. EXAM: CT ANGIOGRAPHY CHEST WITH CONTRAST TECHNIQUE: Multidetector CT imaging of the chest was performed using the standard protocol during bolus administration of intravenous contrast. Multiplanar CT image reconstructions and MIPs were obtained to evaluate the vascular anatomy. CONTRAST:  73m ISOVUE-370 IOPAMIDOL (ISOVUE-370) INJECTION 76% COMPARISON:  None. FINDINGS: Cardiovascular: There is no filling defect in the pulmonary arterial tree to suggest acute pulmonary thromboembolism. No evidence of aortic aneurysm. Great vessels are grossly patent. Mediastinum/Nodes: No abnormal mediastinal adenopathy. Esophagus is unremarkable. No pericardial effusion. Lungs/Pleura: There is a large left lower  lobe mass extending from the left hilum to the left lung base measuring 3.3 x 4.7 x 3.3 cm worrisome for malignancy. It is lobulated and somewhat low density. There is a polypoid element projecting into the left lower lobe lobar airway. There is also occlusion of the left lower lobe bronchus. No pneumothorax.  No pleural effusion. Upper Abdomen: The mass extends through the diaphragm and appears to invade the spleen. Musculoskeletal: No vertebral compression deformity. Review of the MIP images confirms the above findings. IMPRESSION: Large left lower lobe lung mass extending through the diaphragm in and invading the spleen associated with occlusion of the left lower lobe bronchus. Findings are worrisome for malignancy. PET-CT is recommended. No evidence of pulmonary thromboembolism Electronically Signed   By: AMarybelle KillingsM.D.   On: 11/15/2017 17:40    ASSESSMENT & PLAN:  1. Lung mass    Pathology results were reviewed and discussed in detail with patient. Patient has been also questions about patient CT scan and asked detailed questions about size and the position of the lung mass. CT chest image was independently reviewed and discussed with patient and his husband. Since bronchoscopy biopsy is nondiagnostic, will refer patient to see Dr. OGenevive Bifor discussion of open biopsy. Differential diagnosis includes lung cancer, neuroendocrine tumor, etc. Patient's case was discussed on tumor board. Plan biopsy by Dr.Oaks..  Patient and her husband are very anxious and have many questions. All questions were answered. The patient knows to call the clinic with any problems, questions or concerns.  Total face to face encounter time for this patient visit was 245m. >50% of the time was  spent in counseling and coordination of care.   ZhEarlie ServerMD 12/05/2017 7:38 PM

## 2017-12-05 NOTE — Progress Notes (Signed)
Patient here today for follow up. No concerns.

## 2017-12-07 ENCOUNTER — Ambulatory Visit: Payer: Self-pay | Admitting: Cardiothoracic Surgery

## 2017-12-07 ENCOUNTER — Telehealth: Payer: Self-pay

## 2017-12-07 NOTE — Telephone Encounter (Signed)
Patient husband calling States they had an appointment with Dr. Genevive Bi that had been cancelled  Just wanted to follow up with nurse about new appointment scheduled Please call to discuss

## 2017-12-07 NOTE — Telephone Encounter (Signed)
Spoke with patient. Pathology results were provided per Dr.Oaks.  Patient rescheduled to 12/11/17 @ 3pm.

## 2017-12-08 NOTE — Telephone Encounter (Signed)
Called patient and advised to f/u with Dr. Genevive Bi and she will received direction on what the plan is for bronchoscopy. Nothing further needed.

## 2017-12-11 ENCOUNTER — Encounter: Payer: Self-pay | Admitting: Cardiothoracic Surgery

## 2017-12-11 ENCOUNTER — Ambulatory Visit (INDEPENDENT_AMBULATORY_CARE_PROVIDER_SITE_OTHER): Payer: Managed Care, Other (non HMO) | Admitting: Cardiothoracic Surgery

## 2017-12-11 VITALS — BP 147/91 | HR 86 | Temp 97.4°F | Resp 18 | Ht 67.0 in | Wt 211.2 lb

## 2017-12-11 DIAGNOSIS — R918 Other nonspecific abnormal finding of lung field: Secondary | ICD-10-CM | POA: Diagnosis not present

## 2017-12-11 NOTE — Patient Instructions (Signed)
We will send the referral to Dr.Edward Gearhardt and [place] office.  Someone will contact you to schedule an appointment.  If you have not heard from anyone within 5 days please call our office and let us know so we can check on this for you.  Please stop by the Radiologist desk at the [facility] and request a disc of all your images to take to the above appointment with you once it is scheduled.   Please stop by the Cancer center and ask for the disc back that you left with Dr.Brahamanday.

## 2017-12-11 NOTE — Progress Notes (Signed)
Patient ID: Patricia Glenn, female   DOB: [birth year], 32 y.o.   MRN: [id number]  Chief Complaint  Patient presents with  . New Patient (Initial Visit)    Large Left Lower Lung mass    Referred By Dr. Gwendolyn Lima Reason for Referral left lower lobe endobronchial mass  HPI Location, Quality, Duration, Severity, Timing, Context, Modifying Factors, Associated Signs and Symptoms.  Patricia Glenn is a 32 y.o. female.  Her problems began about a year ago in March 2018 when she had an episode of the left lower lobe pneumonia.  She was treated with outpatient therapy and did well until earlier this year when she again experienced an episode of left lower lobe pneumonia.  She had several chest x-rays made which revealed the presence of the pneumonia and ultimate clearing.  About a month ago she had an episode of hemoptysis and this prompted further evaluation with a CT scan.  At the time the patient was well into her pregnancy and after she delivered her baby 3 weeks ago she underwent bronchoscopy.  Bronchoscopy revealed a smooth bordered left lower lobe mass just at the level of the superior segment about 1 cm from the secondary carina.  In addition there is a component extrinsic to the bronchus along the inferior pulmonary vein.  The patient states that she does not get short of breath.  She is able to run a half marathon just a few weeks ago.  This was during her pregnancy.  In addition she is fully functional without any symptoms whatsoever.  She has had no further hemoptysis.   Past Medical History:  Diagnosis Date  . Encounter for induction of labor 11/17/2017  . Encounter for supervision of other normal pregnancy, unspecified trimester 05/23/2017   31 y.o. G3P2002 at 59w1dby Patient's last menstrual period was 03/13/2017. Estimated Date of Delivery: 12/18/17  c/w [id number] Sex of baby and name:  " "  Factors complicating this pregnancy   LLL pneumonia 24 weeks  Inpatient  Repeat CXR 3 weeks  post _0   Elevated BP at NOB- watch closely; baseline labs ordered and WNL-05/23/17  Hx of LGA , previous babies weighed 4309 grams and 4139 gra  . Lung mass   . Mass of lower lobe of left lung 11/17/2017  . Pneumonia complicating pregnancy, second trimester 08/29/2017    Past Surgical History:  Procedure Laterality Date  . FLEXIBLE BRONCHOSCOPY N/A 11/29/2017   Procedure: FLEXIBLE BRONCHOSCOPY;  Surgeon: SiWilhelmina McardleMD;  Location: [facility] ORS;  Service: Pulmonary;  Laterality: N/A;    Family History  Problem Relation Age of Onset  . Bladder Cancer Maternal Grandfather 8035  Social History Social History   Tobacco Use  . Smoking status: Never Smoker  . Smokeless tobacco: Never Used  Substance Use Topics  . Alcohol use: No    Frequency: Never  . Drug use: No    No Known Allergies  Current Outpatient Medications  Medication Sig Dispense Refill  . PRENATAL 28-0.8 MG TABS Take 1 tablet by mouth daily.     No current facility-administered medications for this visit.       Review of Systems A complete review of systems was asked and was negative except for the following positive findings recent childbirth, episode of hemoptysis  Blood pressure (!) 147/91, pulse 86, temperature (!) 97.4 F (36.3 C), temperature source Oral, resp. rate 18, height _1  (1.702 m), weight 211 lb 3.2 oz (95.8 kg), SpO2 99 %,  unknown if currently breastfeeding.  Physical Exam CONSTITUTIONAL:  Pleasant, well-developed, well-nourished, and in no acute distress. EYES: Pupils equal and reactive to light, Sclera non-icteric EARS, NOSE, MOUTH AND THROAT:  The oropharynx was clear.  Dentition is good repair.  Oral mucosa pink and moist. LYMPH NODES:  Lymph nodes in the neck and axillae were normal RESPIRATORY:  Lungs were clear.  Normal respiratory effort without pathologic use of accessory muscles of respiration CARDIOVASCULAR: Heart was regular without murmurs.  There were no carotid bruits. GI:  The abdomen was soft, nontender, and nondistended. There were no palpable masses. There was no hepatosplenomegaly. There were normal bowel sounds in all quadrants. GU:  Rectal deferred.   MUSCULOSKELETAL:  Normal muscle strength and tone.  No clubbing or cyanosis.   SKIN:  There were no pathologic skin lesions.  There were no nodules on palpation. NEUROLOGIC:  Sensation is normal.  Cranial nerves are grossly intact. PSYCH:  Oriented to person, place and time.  Mood and affect are normal.  Data Reviewed CT scan and chest x-ray  I have personally reviewed the patient's imaging, laboratory findings and medical records.    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  The location of the lesion is just within the left lower lobe.  She has not had any pulmonary function tests.    Plan    I had a long discussion with her regarding the findings.  The CT scan would suggest that there is a fairly large component of this tumor beyond the bronchus.  I also explained to them that this may just represent postobstructive pneumonic process with mucoid impaction in the bronchioles.  However this would not be known until the time of surgery.  I think that if she would undergo surgical resection this would best be performed at the main campus at [facility].  I have contacted the nurse manager for the cardiothoracic surgeons there and I discussed the case with her.  We will be happy to provide any postoperative follow-up that is needed.  I did explain to the patient the indications for a left lower lobectomy.  She also understands that if margins are close that bronchoplasty or even pneumonectomy may need to be performed for complete eradication of the tumor.  I answered all of her questions.       Nestor Lewandowsky, MD 12/11/2017, 4:02 PM

## 2017-12-13 ENCOUNTER — Telehealth: Payer: Self-pay | Admitting: Cardiothoracic Surgery

## 2017-12-13 NOTE — Telephone Encounter (Signed)
Patient is being sent to [facility] for consult and surgery. Husband states appt is not until 7/11 and they feel that Dr. Genevive Bi wanted it sooner that than. Please advise

## 2017-12-13 NOTE — Telephone Encounter (Signed)
Spoke with patient and appointment is 12/19/17 Dr.Gearhart.

## 2017-12-13 NOTE — Telephone Encounter (Signed)
Referral sent to [facility] for [facility] consult - have not received a call back but noticed on mychart that she is scheduled for 7/11 which seems too far out. Please advise

## 2017-12-18 DIAGNOSIS — C7A00 Malignant carcinoid tumor of unspecified site: Secondary | ICD-10-CM

## 2017-12-18 DIAGNOSIS — C7B09 Secondary carcinoid tumors of other sites: Secondary | ICD-10-CM

## 2017-12-18 HISTORY — DX: Malignant carcinoid tumor of unspecified site: C7A.00

## 2017-12-18 HISTORY — DX: Secondary carcinoid tumors of other sites: C7B.09

## 2017-12-18 NOTE — Progress Notes (Signed)
.  kc °

## 2017-12-19 ENCOUNTER — Other Ambulatory Visit: Payer: Self-pay | Admitting: Cardiothoracic Surgery

## 2017-12-19 ENCOUNTER — Other Ambulatory Visit: Payer: Self-pay

## 2017-12-19 ENCOUNTER — Other Ambulatory Visit: Payer: Self-pay | Admitting: *Deleted

## 2017-12-19 ENCOUNTER — Encounter: Payer: Self-pay | Admitting: Cardiothoracic Surgery

## 2017-12-19 ENCOUNTER — Telehealth: Payer: Self-pay

## 2017-12-19 ENCOUNTER — Institutional Professional Consult (permissible substitution) (INDEPENDENT_AMBULATORY_CARE_PROVIDER_SITE_OTHER): Payer: Managed Care, Other (non HMO) | Admitting: Cardiothoracic Surgery

## 2017-12-19 VITALS — BP 155/94 | HR 79 | Resp 18 | Ht 67.0 in | Wt 210.0 lb

## 2017-12-19 DIAGNOSIS — R918 Other nonspecific abnormal finding of lung field: Secondary | ICD-10-CM

## 2017-12-19 DIAGNOSIS — R042 Hemoptysis: Secondary | ICD-10-CM

## 2017-12-19 NOTE — Patient Instructions (Signed)
Lung Resection, Care After Refer to this sheet in the next few weeks. These instructions provide you with information on caring for yourself after your procedure. Your health care provider may also give you more specific instructions. Your treatment has been planned according to current medical practices, but problems sometimes occur. Call your health care provider if you have any problems or questions after your procedure. What can I expect after the procedure? After your procedure, it is typical to have the following:  You may feel pain in your chest and throat.  Patients may sometimes shiver or feel nauseous during recovery.  Follow these instructions at home:  You may resume a normal diet and activities as directed by your health care provider.  Do not use any tobacco products, including cigarettes, chewing tobacco, or electronic cigarettes. If you need help quitting, ask your health care provider.  There are many different ways to close and cover an incision, including stitches, skin glue, and adhesive strips. Follow your health care provider's instructions on: ? Incision care. ? Bandage (dressing) changes and removal. ? Incision closure removal.  Take medicines only as directed by your health care provider.  Keep all follow-up visits as directed by your health care provider. This is important.  Try to breathe deeply and cough as directed. Holding a pillow firmly over your ribs may help with discomfort.  If you were given an incentive spirometer in the hospital, continue to use it as directed by your health care provider.  Walk as directed by your health care provider.  You may take a shower and gently wash the area of your incision with water and soap as directed by your health care provider. Do not use anything else to clean your incision except as directed by your health care provider. Do not take baths, swim, or use a hot tub until your health care provider approves. Contact a  health care provider if:  You notice redness, swelling, or increasing pain at the incision site.  You are bleeding at the incision site.  You see pus coming from the incision site.  You notice a bad smell coming from the incision site or bandage.  Your incision breaks open.  You cough up blood or pus, or you develop a cough that produces bad-smelling sputum.  You have pain or swelling in your legs.  You have increasing pain that is not controlled with medicine.  You have trouble managing any of the tubes that have been left in place after surgery.  You have fever or chills. Get help right away if:  You have chest pain or an irregular or rapid heartbeat.  You have dizzy episodes or faint.  You have shortness of breath or difficulty breathing.  You have persistent nausea or vomiting.  You have a rash. This information is not intended to replace advice given to you by your health care provider. Make sure you discuss any questions you have with your health care provider. Document Released: 12/24/2004 Document Revised: 11/12/2015 Document Reviewed: 07/26/2013 Elsevier Interactive Patient Education  2018 [employer]. Lung Resection A lung resection is a procedure to remove part or all of a lung. When an entire lung is removed, the procedure is called a pneumonectomy. When only part of a lung is removed, the procedure is called a lobectomy. A lung resection is typically done to get rid of a tumor or cancer, but it may be done to treat other conditions. This procedure can help relieve some or all of  your symptoms and can also help keep the problem from getting worse. Lung resection may provide the best chance for curing your disease. However, the procedure may not necessarily cure lung cancer if that is the problem. Tell a health care provider about:  Any allergies you have.  All medicines you are taking, including vitamins, herbs, eye drops, creams, and over-the-counter  medicines.  Any problems you or family members have had with anesthetic medicines.  Any blood disorders you have.  Any surgeries you have had.  Any medical conditions you have. What are the risks? Generally, lung resection is a safe procedure. However, problems can occur and include:  Excessive bleeding.  Infection.  Inability to breathe without a ventilator.  Persistent shortness of breath.  Heart problems, including abnormal rhythms and a risk of heart attack or heart failure.  Blood clots.  Injury to a blood vessel.  Injury to a nerve.  Failure to heal properly.  Stroke.  Bronchopleural fistula. This is a small hole between one of the main breathing tubes (bronchus) and the lining of the lungs. This is rare.  Reaction to anesthesia.  What happens before the procedure? You may have tests done before the procedure, including:  Blood tests.  Urine tests.  X-rays.  Other imaging tests (such as CT scans, MRI scans, and PET scans). These tests are done to find the exact size and location of the condition being treated with this surgery.  Pulmonary function tests. These are breathing tests to assess the function of your lungs before surgery and to decide how to best help your breathing after surgery.  Heart testing. This is done to make sure your heart is strong enough for the procedure.  Bronchoscopy. This is a technique that allows your health care provider to look at the inside of your airways. This is done using a soft, flexible tube (bronchoscope). Along with imaging tests, this can help your health care provider know the exact location and size of the area that will be removed during surgery.  Lymph node sampling. This may need to be done to see if the tumor has spread. It may be done as a separate surgery or right before your lung resection procedure.  What happens during the procedure?  An IV tube will be placed in your arm. You will be given a medicine  that makes you fall asleep (general anesthetic). You may also get pain medicine through a thin, flexible tube (catheter) in your back.  A breathing tube will be placed in your throat.  Once the surgical team has prepared you for surgery, your surgeon will make an incision on your side. Some resections are done through large incisions, while others can be done through small incisions using smaller instruments and assisted with small cameras (laparoscopic surgery).  Your surgeon will carefully cut the veins, arteries, and bronchus leading to your lung. After being cut, each of these pieces will be sewn or stapled closed. The lung or part of the lung will then be removed.  Your surgeon will check inside your chest to make sure there is no bleeding in or around the lungs. Lymph nodes near the lung may also be removed for later tests.  Your surgeon may put tubes into your chest to drain extra fluid and air after surgery.  Your incision will be closed. This may be done using: ? Stitches that absorb into your body and do not need to be removed. ? Stitches that must be removed. ? Staples  that must be removed. What happens after the procedure?  You will be taken to the recovery area and your progress will be monitored. You may still have a breathing tube and other tubes or catheters in your body immediately after surgery. These will be removed during your recovery. You may be put on a respirator following surgery if some assistance is needed to help your breathing. When you are awake and not experiencing immediate problems from surgery, you will be moved to the intensive care unit (ICU) where you will continue your recovery.  You may feel pain in your chest and throat. Sometimes during recovery, patients may shiver or feel nauseous. You will be given medicine to help with pain and nausea.  The breathing tube will be taken out as soon as your health care providers feel you can breathe on your own. For  most people, this happens on the same day as the surgery.  If your surgery and time in the ICU go well, most of the tubes and equipment will be taken out within 1-2 days after surgery. This is about how long most people stay in the ICU. You may need to stay longer, depending on how you are doing.  You should also start respiratory therapy in the ICU. This therapy uses breathing exercises to help your other lung stay healthy and get stronger.  As you improve, you will be moved to a regular hospital room for continued respiratory therapy, help with your bladder and bowels, and to continue medicines.  After your lung or part of your lung is taken out, there will be a space inside your chest. This space will often fill up with fluid over time. The amount of time this takes is different for each person.  You will receive care until you are doing well and your health care provider feels it is safe for you to go home or to transfer to an extended care facility. This information is not intended to replace advice given to you by your health care provider. Make sure you discuss any questions you have with your health care provider. Document Released: 08/27/2002 Document Revised: 11/12/2015 Document Reviewed: 07/26/2013 Elsevier Interactive Patient Education  2018 [employer]. Pulmonary Nodule A pulmonary nodule is a small, round growth of tissue in the lung. Pulmonary nodules can range in size from less than 1/5 inch (4 mm) to a little bigger than an inch (25 mm). Most pulmonary nodules are detected when imaging tests of the lung are being performed for a different problem. Pulmonary nodules are usually not cancerous (benign). However, some pulmonary nodules are cancerous (malignant). Follow-up treatment or testing is based on the size of the pulmonary nodule and your risk of getting lung cancer. What are the causes? Benign pulmonary nodules can be caused by various things. Some of the causes  include:  Bacterial, fungal, or viral infections. This is usually an old infection that is no longer active, but it can sometimes be a current, active infection.  A benign mass of tissue.  Inflammation from conditions such as rheumatoid arthritis.  Abnormal blood vessels in the lungs.  Malignant pulmonary nodules can result from lung cancer or from cancers that spread to the lung from other places in the body. What are the signs or symptoms? Pulmonary nodules usually do not cause symptoms. How is this diagnosed? Most often, pulmonary nodules are found incidentally when an X-ray or CT scan is performed to look for some other problem in the lung area. To  help determine whether a pulmonary nodule is benign or malignant, your health care provider will take a medical history and order a variety of tests. Tests done may include:  Blood tests.  A skin test called a tuberculin test. This test is used to determine if you have been exposed to the germ that causes tuberculosis.  Chest X-rays. If possible, a new X-ray may be compared with X-rays you have had in the past.  CT scan. This test shows smaller pulmonary nodules more clearly than an X-ray.  Positron emission tomography (PET) scan. In this test, a safe amount of a radioactive substance is injected into the bloodstream. Then, the scan takes a picture of the pulmonary nodule. The radioactive substance is eliminated from your body in your urine.  Biopsy. A tiny piece of the pulmonary nodule is removed so it can be checked under a microscope.  How is this treated? Pulmonary nodules that are benign normally do not require any treatment because they usually do not cause symptoms or breathing problems. Your health care provider may want to monitor the pulmonary nodule through follow-up CT scans. The frequency of these CT scans will vary based on the size of the nodule and the risk factors for lung cancer. For example, CT scans will need to be done  more frequently if the pulmonary nodule is larger and if you have a history of smoking and a family history of cancer. Further testing or biopsies may be done if any follow-up CT scan shows that the size of the pulmonary nodule has increased. Follow these instructions at home:  Only take over-the-counter or prescription medicines as directed by your health care provider.  Keep all follow-up appointments with your health care provider. Contact a health care provider if:  You have trouble breathing when you are active.  You feel sick or unusually tired.  You do not feel like eating.  You lose weight without trying to.  You develop chills or night sweats. Get help right away if:  You cannot catch your breath, or you begin wheezing.  You cannot stop coughing.  You cough up blood.  You become dizzy or feel like you are going to pass out.  You have sudden chest pain.  You have a fever or persistent symptoms for more than 2-3 days.  You have a fever and your symptoms suddenly get worse. This information is not intended to replace advice given to you by your health care provider. Make sure you discuss any questions you have with your health care provider. Document Released: 04/03/2009 Document Revised: 11/12/2015 Document Reviewed: 11/26/2012 Elsevier Interactive Patient Education  2017 [employer].

## 2017-12-19 NOTE — Telephone Encounter (Signed)
Called Dr. Everrett Coombe office to let them know that the patient will have her PET Scan done until 12/22/2017. They were fine with the date. I then called patient but had to leave her a detailed message letting her know that her PET Scan is scheduled to be on 12/22/2017 at the [facility] and that she needs to be fasting 6 hours prior to her appointment which is at 11:00 AM.

## 2017-12-19 NOTE — Progress Notes (Signed)
[address]       [place] [address]             [phone]                    [name] [place] Medical Record #[id number] Date of Birth: [birth year]  Referring: Nestor Lewandowsky, MD Primary Care: Patient, No Pcp Per Primary Cardiologist: No primary care provider on file.  Chief Complaint:    Chief Complaint  Patient presents with  . Lung Lesion    new patient consultation, CTA Chest 11/15/2017,     History of Present Illness:    Patricia Glenn 32 y.o. female is seen in the office  today for for evaluation of left lung mass.  In March 2018 the patient was treated for a case of left lower lobe pneumonia.  Symptoms cleared at that time but in the winter 2019 she had further episodes of left lower lobe pneumonia.  About a month ago she had an episode of hemoptysis, leading to a CT scan.  Bronchoscopy was performed by Dr. Jamal Collin a smooth bordered left lower lobe lung mass just at the level of the superior segment was noted.  During this time she was pregnant with her third child.  She delivered her baby 3 to 4 weeks early, her original due date was today.  She has had no further hemoptysis  Patient has never been a smoker  Current Activity/ Functional Status:  Patient is independent with mobility/ambulation, transfers, ADL's, IADL's.   Zubrod Score: At the time of surgery this patient's most appropriate activity status/level should be described as: _0     0    Normal activity, no symptoms _1     1    Restricted in physical strenuous activity but ambulatory, able to do out light work _2     2    Ambulatory and capable of self care, unable to do work activities, up and about               >50 % of waking hours                              _3     3    Only limited self care, in bed greater than 50% of waking hours _4     4    Completely disabled, no self care, confined to bed or chair _5     5    Moribund   Past Medical History:  Diagnosis Date  .  Encounter for induction of labor 11/17/2017  . Encounter for supervision of other normal pregnancy, unspecified trimester 05/23/2017   31 y.o. G3P2002 at 36w1dby Patient's last menstrual period was 03/13/2017. Estimated Date of Delivery: 12/18/17  c/w 148w1dltrasound Sex of baby and name:  " "  Factors complicating this pregnancy   LLL pneumonia 24 weeks  Inpatient  Repeat CXR 3 weeks post _6   Elevated BP at NOB- watch closely; baseline labs ordered and WNL-05/23/17  Hx of LGA , previous babies weighed 4309 grams and 4139 gra  . Lung mass   . Mass of lower lobe of left lung 11/17/2017  . Pneumonia complicating pregnancy, second trimester 08/29/2017    Past Surgical History:  Procedure Laterality Date  . FLEXIBLE BRONCHOSCOPY N/A 11/29/2017   Procedure: FLEXIBLE BRONCHOSCOPY;  Surgeon: SiWilhelmina McardleMD;  Location: [facility] ORS;  Service: Pulmonary;  Laterality:  N/A;    Family History  Problem Relation Age of Onset  . Bladder Cancer Maternal Grandfather 17     Social History   Tobacco Use  Smoking Status Never Smoker  Smokeless Tobacco Never Used    Social History   Substance and Sexual Activity  Alcohol Use No  . Frequency: Never     No Known Allergies  Current Outpatient Medications  Medication Sig Dispense Refill  . PRENATAL 28-0.8 MG TABS Take 1 tablet by mouth daily.     No current facility-administered medications for this visit.     Pertinent items are noted in HPI.   Review of Systems:     Cardiac Review of Systems: [Y] = yes  or   [ N ] = no   Chest Pain [  y left ]  Resting SOB [   n] Exertional SOB  [  ]  Orthopnea [ n ]   Pedal Edema [n   ]    Palpitations [n  ] Syncope  Florencio.Farrier  ]   Prensyncope [ n  ]   General Review of Systems: [Y] = yes [  ]=no Constitional: recent weight change [  ];  Wt loss over the last 3 months [   ] anorexia [  ]; fatigue [  ]; nausea [  ]; night sweats [  ]; fever [  ]; or chills [  ];           Eye : blurred vision [  ]; diplopia [    ]; vision changes [  ];  Amaurosis fugax[  ]; Resp: cough [  ];  wheezing[  ];  hemoptysis[  ]; shortness of breath[  ]; paroxysmal nocturnal dyspnea[  ]; dyspnea on exertion[  ]; or orthopnea[  ];  GI:  gallstones[  ], vomiting[  ];  dysphagia[  ]; melena[  ];  hematochezia [  ]; heartburn[  ];   Hx of  Colonoscopy[  ]; GU: kidney stones [  ]; hematuria[  ];   dysuria [  ];  nocturia[  ];  history of     obstruction [  ]; urinary frequency [  ]             Skin: rash, swelling[  ];, hair loss[  ];  peripheral edema[  ];  or itching[  ]; Musculosketetal: myalgias[  ];  joint swelling[  ];  joint erythema[  ];  joint pain[  ];  back pain[  ];  Heme/Lymph: bruising[  ];  bleeding[  ];  anemia[  ];  Neuro: TIA[  ];  headaches[  ];  stroke[  ];  vertigo[  ];  seizures[  ];   paresthesias[  ];  difficulty walking[  ];  Psych:depression[  ]; anxiety[  ];  Endocrine: diabetes[  ];  thyroid dysfunction[  ];  Immunizations: Flu up to date [  ]; Pneumococcal up to date [  ];  Other:     PHYSICAL EXAMINATION: BP (!) 155/94 (BP Location: Right Arm, Patient Position: Sitting, Cuff Size: Normal)   Pulse 79   Resp 18   Ht _0  (1.702 m)   Wt 210 lb (95.3 kg)   SpO2 98% Comment: RA  BMI 32.89 kg/m  General appearance: alert, cooperative, appears stated age and no distress Head: Normocephalic, without obvious abnormality, atraumatic Neck: no adenopathy, no carotid bruit, no JVD, supple, symmetrical, trachea midline and thyroid not enlarged, symmetric, no tenderness/mass/nodules Lymph nodes: Cervical, supraclavicular,  and axillary nodes normal. Resp: clear to auscultation bilaterally Back: symmetric, no curvature. ROM normal. No CVA tenderness. Cardio: regular rate and rhythm, S1, S2 normal, no murmur, click, rub or gallop GI: soft, non-tender; bowel sounds normal; no masses,  no organomegaly Extremities: extremities normal, atraumatic, no cyanosis or edema and Homans sign is negative, no sign of  DVT Neurologic: Grossly normal  Diagnostic Studies & Laboratory data:     Recent Radiology Findings:  CLINICAL DATA:  Hemoptysis.  Pneumonia.  Thirty-five weeks pregnant.  EXAM: CT ANGIOGRAPHY CHEST WITH CONTRAST  TECHNIQUE: Multidetector CT imaging of the chest was performed using the standard protocol during bolus administration of intravenous contrast. Multiplanar CT image reconstructions and MIPs were obtained to evaluate the vascular anatomy.  CONTRAST:  51m ISOVUE-370 IOPAMIDOL (ISOVUE-370) INJECTION 76%  COMPARISON:  None.  FINDINGS: Cardiovascular: There is no filling defect in the pulmonary arterial tree to suggest acute pulmonary thromboembolism. No evidence of aortic aneurysm. Great vessels are grossly patent.  Mediastinum/Nodes: No abnormal mediastinal adenopathy. Esophagus is unremarkable. No pericardial effusion.  Lungs/Pleura: There is a large left lower lobe mass extending from the left hilum to the left lung base measuring 3.3 x 4.7 x 3.3 cm worrisome for malignancy. It is lobulated and somewhat low density. There is a polypoid element projecting into the left lower lobe lobar airway. There is also occlusion of the left lower lobe bronchus.  No pneumothorax.  No pleural effusion.  Upper Abdomen: The mass extends through the diaphragm and appears to invade the spleen.  Musculoskeletal: No vertebral compression deformity.  Review of the MIP images confirms the above findings.  IMPRESSION: Large left lower lobe lung mass extending through the diaphragm in and invading the spleen associated with occlusion of the left lower lobe bronchus. Findings are worrisome for malignancy. PET-CT is recommended.  No evidence of pulmonary thromboembolism   Electronically Signed   By: AMarybelle KillingsM.D.   On: 11/15/2017 17:40  I have independently reviewed the above radiology studies  and reviewed the findings with the patient.  I do not concur  this appears to be invading the diaphragm and/or the spleen.  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  A PET scan is pending.  I reviewed with the patient and her husband the CT and bronchoscopy findings.  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  I have recommended that we proceed with left lower lobectomy, after the PET scan has been done and reviewed.  Patient is agreeable she and her husband's questions have been answered in detail.      I  spent 60 minutes with  the patient face to face and greater then 50% of the time was spent in counseling and coordination of care.    EGrace IsaacMD      [address] [place] [address]Office [phone]  Beeper [phone] 12/19/2017 4:02 PM

## 2017-12-20 ENCOUNTER — Other Ambulatory Visit: Payer: Self-pay | Admitting: *Deleted

## 2017-12-20 ENCOUNTER — Telehealth: Payer: Self-pay | Admitting: Cardiothoracic Surgery

## 2017-12-20 ENCOUNTER — Encounter: Payer: Self-pay | Admitting: *Deleted

## 2017-12-20 DIAGNOSIS — R918 Other nonspecific abnormal finding of lung field: Secondary | ICD-10-CM

## 2017-12-20 NOTE — Telephone Encounter (Signed)
Peggy with [employer] called and need pet scan order signed for PETscan scheduled for 12-22-17. She has sent an  in basket message to him. Dr Genevive Bi was made aware by Rosann Auerbach & he stated to give it to the girls.They could sign.

## 2017-12-22 ENCOUNTER — Ambulatory Visit
Admission: RE | Admit: 2017-12-22 | Discharge: 2017-12-22 | Disposition: A | Discharge status: Home / Self Care | Payer: Managed Care, Other (non HMO) | Source: Ambulatory Visit | Attending: Cardiothoracic Surgery | Admitting: Cardiothoracic Surgery

## 2017-12-22 DIAGNOSIS — J984 Other disorders of lung: Secondary | ICD-10-CM | POA: Diagnosis not present

## 2017-12-22 DIAGNOSIS — J90 Pleural effusion, not elsewhere classified: Secondary | ICD-10-CM | POA: Diagnosis not present

## 2017-12-22 DIAGNOSIS — J9811 Atelectasis: Secondary | ICD-10-CM | POA: Insufficient documentation

## 2017-12-22 DIAGNOSIS — R918 Other nonspecific abnormal finding of lung field: Secondary | ICD-10-CM | POA: Insufficient documentation

## 2017-12-22 LAB — GLUCOSE, CAPILLARY: GLUCOSE-CAPILLARY: 85 mg/dL (ref 70–99)

## 2017-12-22 MED ORDER — FLUDEOXYGLUCOSE F - 18 (FDG) INJECTION
10.9000 | Freq: Once | INTRAVENOUS | Status: AC | PRN
  Administered 2017-12-22: 10.93 via INTRAVENOUS

## 2017-12-22 NOTE — Telephone Encounter (Signed)
I spoke with Thurmond Butts, RN with Dr Everrett Coombe office and advised her that the patient did have her PET scan done. She stated that she would forward the results to Dr Servando Snare for him to review.   Phone: [phone]

## 2017-12-25 NOTE — Pre-Procedure Instructions (Signed)
Patricia Glenn  12/25/2017      [facility] [place], [place] - [address] [place] [address] Phone: [phone] Fax: [phone]    Your procedure is scheduled on Friday July 12.  Report to [facility] Admitting at 5:30 A.M.  Call this number if you have problems the morning of surgery:  [phone]   Remember:  Do not eat or drink after midnight.    Take these medicines the morning of surgery with A SIP OF WATER: NONE  7 days prior to surgery STOP taking any Aspirin(unless otherwise instructed by your surgeon), Aleve, Naproxen, Ibuprofen, Motrin, Advil, Goody's, BC's, all herbal medications, fish oil, and all vitamins     Do not wear jewelry, make-up or nail polish.  Do not wear lotions, powders, or perfumes, or deodorant.  Do not shave 48 hours prior to surgery.  Men may shave face and neck.  Do not bring valuables to the hospital.  [facility] is not responsible for any belongings or valuables.  Contacts, dentures or bridgework may not be worn into surgery.  Leave your suitcase in the car.  After surgery it may be brought to your room.  For patients admitted to the hospital, discharge time will be determined by your treatment team.  Patients discharged the day of surgery will not be allowed to drive home.   Special instructions:    [facility]- Preparing For Surgery  Before surgery, you can play an important role. Because skin is not sterile, your skin needs to be as free of germs as possible. You can reduce the number of germs on your skin by washing with CHG (chlorahexidine gluconate) Soap before surgery.  CHG is an antiseptic cleaner which kills germs and bonds with the skin to continue killing germs even after washing.    Oral Hygiene is also important to reduce your risk of infection.  Remember - BRUSH YOUR TEETH THE MORNING OF SURGERY WITH YOUR REGULAR TOOTHPASTE  Please do not use if you have an allergy to CHG  or antibacterial soaps. If your skin becomes reddened/irritated stop using the CHG.  Do not shave (including legs and underarms) for at least 48 hours prior to first CHG shower. It is OK to shave your face.  Please follow these instructions carefully.   1. Shower the NIGHT BEFORE SURGERY and the MORNING OF SURGERY with CHG.   2. If you chose to wash your hair, wash your hair first as usual with your normal shampoo.  3. After you shampoo, rinse your hair and body thoroughly to remove the shampoo.  4. Use CHG as you would any other liquid soap. You can apply CHG directly to the skin and wash gently with a scrungie or a clean washcloth.   5. Apply the CHG Soap to your body ONLY FROM THE NECK DOWN.  Do not use on open wounds or open sores. Avoid contact with your eyes, ears, mouth and genitals (private parts). Wash Face and genitals (private parts)  with your normal soap.  6. Wash thoroughly, paying special attention to the area where your surgery will be performed.  7. Thoroughly rinse your body with warm water from the neck down.  8. DO NOT shower/wash with your normal soap after using and rinsing off the CHG Soap.  9. Pat yourself dry with a CLEAN TOWEL.  10. Wear CLEAN PAJAMAS to bed the night before surgery, wear comfortable clothes the morning of surgery  11. Place CLEAN SHEETS on your bed the night of your first shower and DO NOT SLEEP WITH PETS.    Day of Surgery:  Do not apply any deodorants/lotions.  Please wear clean clothes to the hospital/surgery center.   Remember to brush your teeth WITH YOUR REGULAR TOOTHPASTE.    Please read over the following fact sheets that you were given. Coughing and Deep Breathing, MRSA Information and Surgical Site Infection Prevention

## 2017-12-27 ENCOUNTER — Ambulatory Visit (HOSPITAL_COMMUNITY)
Admission: RE | Admit: 2017-12-27 | Discharge: 2017-12-27 | Disposition: A | Discharge status: Home / Self Care | Payer: Managed Care, Other (non HMO) | Source: Ambulatory Visit | Attending: Cardiothoracic Surgery | Admitting: Cardiothoracic Surgery

## 2017-12-27 ENCOUNTER — Encounter (HOSPITAL_COMMUNITY)
Admission: RE | Admit: 2017-12-27 | Discharge: 2017-12-27 | Disposition: A | Discharge status: Home / Self Care | Payer: Managed Care, Other (non HMO) | Source: Ambulatory Visit | Attending: Cardiothoracic Surgery | Admitting: Cardiothoracic Surgery

## 2017-12-27 ENCOUNTER — Encounter (HOSPITAL_COMMUNITY): Payer: Self-pay

## 2017-12-27 ENCOUNTER — Other Ambulatory Visit: Payer: Self-pay

## 2017-12-27 DIAGNOSIS — R918 Other nonspecific abnormal finding of lung field: Secondary | ICD-10-CM

## 2017-12-27 DIAGNOSIS — Z01818 Encounter for other preprocedural examination: Secondary | ICD-10-CM | POA: Diagnosis present

## 2017-12-27 DIAGNOSIS — Z01812 Encounter for preprocedural laboratory examination: Secondary | ICD-10-CM | POA: Diagnosis present

## 2017-12-27 HISTORY — DX: Headache: R51

## 2017-12-27 HISTORY — DX: Headache, unspecified: R51.9

## 2017-12-27 LAB — CBC
HCT: 42.3 % (ref 36.0–46.0)
Hemoglobin: 13.4 g/dL (ref 12.0–15.0)
MCH: 27.4 pg (ref 26.0–34.0)
MCHC: 31.7 g/dL (ref 30.0–36.0)
MCV: 86.5 fL (ref 78.0–100.0)
Platelets: 203 10*3/uL (ref 150–400)
RBC: 4.89 MIL/uL (ref 3.87–5.11)
RDW: 12.6 % (ref 11.5–15.5)
WBC: 6.5 10*3/uL (ref 4.0–10.5)

## 2017-12-27 LAB — TYPE AND SCREEN
ABO/RH(D): O POS
Antibody Screen: NEGATIVE

## 2017-12-27 LAB — COMPREHENSIVE METABOLIC PANEL
ALT: 25 U/L (ref 0–44)
AST: 21 U/L (ref 15–41)
Albumin: 3.7 g/dL (ref 3.5–5.0)
Alkaline Phosphatase: 68 U/L (ref 38–126)
Anion gap: 10 (ref 5–15)
BUN: 10 mg/dL (ref 6–20)
CO2: 21 mmol/L — ABNORMAL LOW (ref 22–32)
Calcium: 8.9 mg/dL (ref 8.9–10.3)
Chloride: 107 mmol/L (ref 98–111)
Creatinine, Ser: 0.85 mg/dL (ref 0.44–1.00)
GFR calc Af Amer: >60 mL/min (ref >60)
GFR calc non Af Amer: >60 mL/min (ref >60)
Glucose, Bld: 110 mg/dL — ABNORMAL HIGH (ref 70–99)
Potassium: 3.4 mmol/L — ABNORMAL LOW (ref 3.5–5.1)
Sodium: 138 mmol/L (ref 135–145)
Total Bilirubin: 0.3 mg/dL (ref 0.3–1.2)
Total Protein: 7.1 g/dL (ref 6.5–8.1)

## 2017-12-27 LAB — PULMONARY FUNCTION TEST
DL/VA % pred: 89 %
DL/VA: 4.59 ml/min/mmHg/L
DLCO unc % pred: 86 %
DLCO unc: 24.53 ml/min/mmHg
FEF 25-75 Post: 5.22 L/sec
FEF 25-75 Pre: 4.61 L/sec
FEF2575-%Change-Post: 13 %
FEF2575-%Pred-Post: 146 %
FEF2575-%Pred-Pre: 129 %
FEV1-%Change-Post: 4 %
FEV1-%Pred-Post: 114 %
FEV1-%Pred-Pre: 109 %
FEV1-Post: 3.94 L
FEV1-Pre: 3.76 L
FEV1FVC-%Change-Post: 1 %
FEV1FVC-%Pred-Pre: 104 %
FEV6-%Change-Post: 3 %
FEV6-%Pred-Post: 108 %
FEV6-%Pred-Pre: 104 %
FEV6-Post: 4.43 L
FEV6-Pre: 4.29 L
FEV6FVC-%Pred-Post: 101 %
FEV6FVC-%Pred-Pre: 101 %
FVC-%Change-Post: 3 %
FVC-%Pred-Post: 106 %
FVC-%Pred-Pre: 103 %
FVC-Post: 4.43 L
FVC-Pre: 4.29 L
Post FEV1/FVC ratio: 89 %
Post FEV6/FVC ratio: 100 %
Pre FEV1/FVC ratio: 88 %
Pre FEV6/FVC Ratio: 100 %
RV % pred: 101 %
RV: 1.6 L
TLC % pred: 109 %
TLC: 6 L

## 2017-12-27 LAB — BLOOD GAS, ARTERIAL
Acid-Base Excess: 1.6 mmol/L (ref 0.0–2.0)
Bicarbonate: 25.5 mmol/L (ref 20.0–28.0)
Drawn by: 449841
FIO2: 21
O2 Saturation: 94.2 %
Patient temperature: 98.6
pCO2 arterial: 38.9 mmHg (ref 32.0–48.0)
pH, Arterial: 7.433 (ref 7.350–7.450)
pO2, Arterial: 69.8 mmHg — ABNORMAL LOW (ref 83.0–108.0)

## 2017-12-27 LAB — URINALYSIS, ROUTINE W REFLEX MICROSCOPIC
Bilirubin Urine: NEGATIVE
Glucose, UA: NEGATIVE mg/dL
Hgb urine dipstick: NEGATIVE
Ketones, ur: NEGATIVE mg/dL
Leukocytes, UA: NEGATIVE
Nitrite: NEGATIVE
Protein, ur: NEGATIVE mg/dL
Specific Gravity, Urine: 1.024 (ref 1.005–1.030)
pH: 5 (ref 5.0–8.0)

## 2017-12-27 LAB — ABO/RH: ABO/RH(D): O POS

## 2017-12-27 LAB — SURGICAL PCR SCREEN
MRSA, PCR: NEGATIVE
Staphylococcus aureus: NEGATIVE

## 2017-12-27 LAB — PROTIME-INR
INR: 0.96
Prothrombin Time: 12.7 seconds (ref 11.4–15.2)

## 2017-12-27 LAB — APTT: aPTT: 31 seconds (ref 24–36)

## 2017-12-27 MED ORDER — ALBUTEROL SULFATE (2.5 MG/3ML) 0.083% IN NEBU
2.5000 mg | INHALATION_SOLUTION | Freq: Once | RESPIRATORY_TRACT | Status: AC
  Administered 2017-12-27: 2.5 mg via RESPIRATORY_TRACT

## 2017-12-27 NOTE — Progress Notes (Addendum)
Anesthesia Chart Review:   Case:  [id number] Date/Time:  12/29/17 0715   Procedures:      VIDEO BRONCHOSCOPY (N/A )     VIDEO ASSISTED THORACOSCOPY (VATS)/LUNG RESECTION (Left Chest)   Anesthesia type:  General   Pre-op diagnosis:  LLL TUMOR   Location:  [facility] OR ROOM 14 / [facility]   Surgeon:  Grace Isaac, MD      DISCUSSION: - Pt with hx recent pregnancy (has 37 week old infant),   - Pt tachycardic at pre-admission testing. Pt does not have hx tachycardia.  Just prior to pre-admission testing, pt reports she had PFTs and was given albuterol.  Since albuterol she has been feeling jittery and like her heart is racing. HR was 79 at last office visit with Dr. Servando Snare 12/19/17. Increased HR likely related to albuterol  - HR 82 at office visit 12/28/17 with Dr. Servando Snare    VS: BP (!) 148/88   Pulse (!) 110 Comment: notified Theatre stage manager  Temp 36.8 C (Oral)   Resp 18   Ht _0  (1.702 m)   Wt 210 lb 1.6 oz (95.3 kg)   SpO2 100%   BMI 32.91 kg/m    PROVIDERS: Patient, No Pcp Per   LABS: Labs reviewed: Acceptable for surgery. (all labs ordered are listed, but only abnormal results are displayed)  Labs Reviewed  BLOOD GAS, ARTERIAL - Abnormal; Notable for the following components:      Result Value   pO2, Arterial 69.8 (*)    All other components within normal limits  COMPREHENSIVE METABOLIC PANEL - Abnormal; Notable for the following components:   Potassium 3.4 (*)    CO2 21 (*)    Glucose, Bld 110 (*)    All other components within normal limits  SURGICAL PCR SCREEN  APTT  CBC  PROTIME-INR  URINALYSIS, ROUTINE W REFLEX MICROSCOPIC  TYPE AND SCREEN  ABO/RH     IMAGES:  CXR 12/27/17: Persistent known mass in the left lower lobe. Otherwise no acute cardiopulmonary abnormality.  CT angio 11/15/17:  - Large left lower lobe lung mass extending through the diaphragm in and invading the spleen associated with occlusion of the left lower lobe bronchus. - Findings are worrisome  for malignancy. PET-CT is recommended. - No evidence of pulmonary thromboembolism   EKG 12/27/17: Sinus tachycardia (106 bpm)   Past Medical History:  Diagnosis Date  . Encounter for induction of labor 11/17/2017  . Encounter for supervision of other normal pregnancy, unspecified trimester 05/23/2017   32 y.o. G3P2002 at 13w1dby Patient's last menstrual period was 03/13/2017. Estimated Date of Delivery: 12/18/17  c/w 136w1dltrasound Sex of baby and name:  " "  Factors complicating this pregnancy   LLL pneumonia 24 weeks  Inpatient  Repeat CXR 3 weeks post _1   Elevated BP at NOB- watch closely; baseline labs ordered and WNL-05/23/17  Hx of LGA , previous babies weighed 4309 grams and 4139 gra  . 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 Lung mass   . Mass of lower lobe of left lung 11/17/2017  . Pneumonia complicating pregnancy, second trimester 08/29/2017    Past Surgical History:  Procedure Laterality Date  . FLEXIBLE BRONCHOSCOPY N/A 11/29/2017   Procedure: FLEXIBLE BRONCHOSCOPY;  Surgeon: SiWilhelmina McardleMD;  Location: [facility] ORS;  Service: Pulmonary;  Laterality: N/A;    MEDICATIONS: . ibuprofen (ADVIL,MOTRIN) 200 MG tablet  . PRENATAL 28-0.8 MG TABS  No current facility-administered medications for this encounter.     If no changes, I anticipate pt can proceed with surgery as scheduled.   Willeen Cass, FNP-BC [facility]/Anesthesiology Phone: [phone] 12/27/2017 2:52 PM

## 2017-12-27 NOTE — Progress Notes (Addendum)
PCP - none Cardiologist - denies  Chest x-ray - 12/27/2017  EKG - 12/27/2017   Anesthesia review: FYI pt has recent pregnancy 5 weeks ago. Pt HR >100 at PAT appointment, normally 60-70 per patient (she is a runner even through pregnancy until 29 weeks) pt states she just came from PFTs where she received albuterol and she feels "jittery"  Pt is breast feeding. Instructed pt to pump prior to leaving home in the AM, then she will pump again prior to going back to surgery. Pt pumps Q3-4 hr and is worried about leaking during surgery.   Patient denies shortness of breath, fever, cough and chest pain at PAT appointment  Patient verbalized understanding of instructions that were given to them at the PAT appointment. Patient was also instructed that they will need to review over the PAT instructions again at home before surgery.

## 2017-12-28 ENCOUNTER — Encounter: Payer: Managed Care, Other (non HMO) | Admitting: Cardiothoracic Surgery

## 2017-12-28 ENCOUNTER — Other Ambulatory Visit: Payer: Self-pay | Admitting: *Deleted

## 2017-12-28 ENCOUNTER — Ambulatory Visit (INDEPENDENT_AMBULATORY_CARE_PROVIDER_SITE_OTHER): Payer: Managed Care, Other (non HMO) | Admitting: Cardiothoracic Surgery

## 2017-12-28 VITALS — BP 138/98 | HR 82 | Resp 20 | Ht 67.0 in | Wt 210.0 lb

## 2017-12-28 DIAGNOSIS — R042 Hemoptysis: Secondary | ICD-10-CM

## 2017-12-28 DIAGNOSIS — R918 Other nonspecific abnormal finding of lung field: Secondary | ICD-10-CM

## 2017-12-28 NOTE — Progress Notes (Signed)
[address]       [place] [address]             [phone]                    [name] Record #[id number] Date of Birth: Jan 23, 1986  Referring: Nestor Lewandowsky, MD Primary Care: Patient, No Pcp Per Primary Cardiologist: No primary care provider on file.  Chief Complaint:    Chief Complaint  Patient presents with  . Lung Mass    Further discuss surgery sch'ed for 12/29/17, PFT's 12/27/17, PET Scan 12/22/17    History of Present Illness:    Patricia Glenn 32 y.o. female is seen in the office  today for further evaluation and consideration of resection  of left lung mass.  In March 2018 the patient was treated for a case of left lower lobe pneumonia.  Symptoms cleared at that time but in the winter 2019 she had further episodes of left lower lobe pneumonia.  About a month ago she had an episode of hemoptysis, leading to a CT scan.  Bronchoscopy was performed by Dr. Jamal Collin a smooth bordered left lower lobe lung mass just at the level of the superior segment was noted.  During this time she was pregnant with her third child.  She delivered her baby 3 to 4 weeks early, her original due date was today.  She has had no further hemoptysis  Patient has never been a smoker  Since last seen PET scan has been done and formal pulmonary function studies.  Current Activity/ Functional Status:  Patient is independent with mobility/ambulation, transfers, ADL's, IADL's.   Zubrod Score: At the time of surgery this patient's most appropriate activity status/level should be described as: _0     0    Normal activity, no symptoms _1     1    Restricted in physical strenuous activity but ambulatory, able to do out light work _2     2    Ambulatory and capable of self care, unable to do work activities, up and about               >50 % of waking hours                              _3     3    Only limited self care, in bed greater than 50% of waking  hours _4     4    Completely disabled, no self care, confined to bed or chair _5     5    Moribund   Past Medical History:  Diagnosis Date  . Encounter for induction of labor 11/17/2017  . Encounter for supervision of other normal pregnancy, unspecified trimester 05/23/2017   31 y.o. G3P2002 at 58w1dby Patient's last menstrual period was 03/13/2017. Estimated Date of Delivery: 12/18/17  c/w 120w1dltrasound Sex of baby and name:  " "  Factors complicating this pregnancy   LLL pneumonia 24 weeks  Inpatient  Repeat CXR 3 weeks post _6   Elevated BP at NOB- watch closely; baseline labs ordered and WNL-05/23/17  Hx of LGA , previous babies weighed 4309 grams and 4139 gra  . 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 Lung mass   . Mass of lower lobe of left lung 11/17/2017  . Pneumonia complicating pregnancy,  second trimester 08/29/2017    Past Surgical History:  Procedure Laterality Date  . FLEXIBLE BRONCHOSCOPY N/A 11/29/2017   Procedure: FLEXIBLE BRONCHOSCOPY;  Surgeon: Wilhelmina Mcardle, MD;  Location: [facility] ORS;  Service: Pulmonary;  Laterality: N/A;    Family History  Problem Relation Age of Onset  . Bladder Cancer Maternal Grandfather 46     Social History   Tobacco Use  Smoking Status Never Smoker  Smokeless Tobacco Never Used    Social History   Substance and Sexual Activity  Alcohol Use Yes  . Frequency: Never   Comment: once a month/year none recently d/t pregnancy     No Known Allergies  Current Outpatient Medications  Medication Sig Dispense Refill  . ibuprofen (ADVIL,MOTRIN) 200 MG tablet Take 400 mg by mouth daily as needed for moderate pain.    Marland Kitchen PRENATAL 28-0.8 MG TABS Take 1 tablet by mouth daily.     No current facility-administered medications for this visit.     Pertinent items are noted in HPI.   Review of Systems:     Cardiac Review of Systems: [Y] = yes  or   [ N ] = no   Chest Pain [  y left ]  Resting SOB [    n] Exertional SOB  [  ]  Orthopnea [ n ]   Pedal Edema [n   ]    Palpitations [n  ] Syncope  Florencio.Farrier  ]   Prensyncope [ n  ]   General Review of Systems: [Y] = yes [  ]=no Constitional: recent weight change [  ];  Wt loss over the last 3 months [   ] anorexia [  ]; fatigue [  ]; nausea [  ]; night sweats [  ]; fever [  ]; or chills [  ];           Eye : blurred vision [  ]; diplopia [   ]; vision changes [  ];  Amaurosis fugax[  ]; Resp: cough [  ];  wheezing[  ];  hemoptysis[  ]; shortness of breath[  ]; paroxysmal nocturnal dyspnea[  ]; dyspnea on exertion[  ]; or orthopnea[  ];  GI:  gallstones[  ], vomiting[  ];  dysphagia[  ]; melena[  ];  hematochezia [  ]; heartburn[  ];   Hx of  Colonoscopy[  ]; GU: kidney stones [  ]; hematuria[  ];   dysuria [  ];  nocturia[  ];  history of     obstruction [  ]; urinary frequency [  ]             Skin: rash, swelling[  ];, hair loss[  ];  peripheral edema[  ];  or itching[  ]; Musculosketetal: myalgias[  ];  joint swelling[  ];  joint erythema[  ];  joint pain[  ];  back pain[  ];  Heme/Lymph: bruising[  ];  bleeding[  ];  anemia[  ];  Neuro: TIA[  ];  headaches[  ];  stroke[  ];  vertigo[  ];  seizures[  ];   paresthesias[  ];  difficulty walking[  ];  Psych:depression[  ]; anxiety[  ];  Endocrine: diabetes[  ];  thyroid dysfunction[  ];  Immunizations: Flu up to date [  ]; Pneumococcal up to date [  ];  Other:     PHYSICAL EXAMINATION: BP (!) 138/98   Pulse 82   Resp 20   Ht _0  (  1.702 m)   Wt 210 lb (95.3 kg)   SpO2 100% Comment: RA  BMI 32.89 kg/m  General appearance: alert, cooperative, appears stated age and no distress Head: Normocephalic, without obvious abnormality, atraumatic Neck: no adenopathy, no carotid bruit, no JVD, supple, symmetrical, trachea midline and thyroid not enlarged, symmetric, no tenderness/mass/nodules Lymph nodes: Cervical, supraclavicular, and axillary nodes normal. Resp: clear to auscultation bilaterally Back:  symmetric, no curvature. ROM normal. No CVA tenderness. Cardio: regular rate and rhythm, S1, S2 normal, no murmur, click, rub or gallop GI: soft, non-tender; bowel sounds normal; no masses,  no organomegaly Extremities: extremities normal, atraumatic, no cyanosis or edema and Homans sign is negative, no sign of DVT Neurologic: Grossly normal  Diagnostic Studies & Laboratory data:     Recent Radiology Findings:   Dg Chest 2 View  Result Date: 12/27/2017 CLINICAL DATA:  Preoperative examination prior to left lower lobectomy for lung mass. Nonsmoker. EXAM: CHEST - 2 VIEW COMPARISON:  Chest CT scan of Nov 15, 2017 and chest x-ray of the same day FINDINGS: The lungs are adequately inflated. There is persistent increased density in the left retrocardiac region. There is mild blunting of the posterior costophrenic angle. The right lung is clear. The heart and pulmonary vascularity are normal. The mediastinum is normal in width. The bony thorax is unremarkable. IMPRESSION: Persistent known mass in the left lower lobe. Otherwise no acute cardiopulmonary abnormality. Electronically Signed   By: David  Martinique M.D.   On: 12/27/2017 11:00   Nm Pet Image Initial (pi) Skull Base To Thigh  Result Date: 12/22/2017 CLINICAL DATA:  Initial treatment strategy for lung mass. EXAM: NUCLEAR MEDICINE PET SKULL BASE TO THIGH TECHNIQUE: 10.9 mCi F-18 FDG was injected intravenously. Full-ring PET imaging was performed from the skull base to thigh after the radiotracer. CT data was obtained and used for attenuation correction and anatomic localization. Fasting blood glucose: 85 mg/dl COMPARISON:  None FINDINGS: Mediastinal blood pool activity: SUV max 2.7 NECK: No hypermetabolic lymph nodes in the neck. Incidental CT findings: none CHEST: The left lower lobe perihilar lung lesion containing areas of calcification are identified. This measures 3.8 by 3.1 cm and has an SUV max of 5.7. Postobstructive consolidation within the left  lower lobe with atelectasis and volume loss is identified. Only mild FDG uptake is associated with the consolidated left lower lobe with an SUV max of 1.59. Small left effusion identified. No hypermetabolic mediastinal lymph nodes. No hypermetabolic right hilar nodes. Intense radiotracer uptake is identified within both breasts, left greater than right. The SUV max in the left breast is equal to 6.5, this is relatively nonspecific in the early partum time frame and may reflect physiologic activity. Incidental CT findings: none ABDOMEN/PELVIS: No abnormal hypermetabolic activity within the liver, pancreas, adrenal glands, or spleen. No hypermetabolic lymph nodes in the abdomen or pelvis. Incidental CT findings: none SKELETON: There is heterogeneous radiotracer activity identified throughout the visualized axial and appendicular skeleton. No definite evidence for hypermetabolic bone metastases. Incidental CT findings: none IMPRESSION: 1. Moderate FDG uptake associated with the partially calcified lesion involving the perihilar left lower. 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 Correlation with tissue sampling advise. 2. Evidence of postobstructive atelectasis/pneumonitis involving the left lower lobe with small left effusion. 3. No evidence for mediastinal nodal metastasis or distant metastatic disease. 4. Intense uptake within the breast tissue is noted bilaterally. In the early postpartum time frame this  may reflect physiologic activity. Consider correlation with mammographic studies. Electronically Signed   By: Kerby Moors M.D.   On: 12/22/2017 13:17  I have independently reviewed the above radiology studies  and reviewed the findings with the patient.    CLINICAL DATA:  Hemoptysis.  Pneumonia.  Thirty-five weeks pregnant.  EXAM: CT ANGIOGRAPHY CHEST WITH CONTRAST  TECHNIQUE: Multidetector CT imaging of  the chest was performed using the standard protocol during bolus administration of intravenous contrast. Multiplanar CT image reconstructions and MIPs were obtained to evaluate the vascular anatomy.  CONTRAST:  56m ISOVUE-370 IOPAMIDOL (ISOVUE-370) INJECTION 76%  COMPARISON:  None.  FINDINGS: Cardiovascular: There is no filling defect in the pulmonary arterial tree to suggest acute pulmonary thromboembolism. No evidence of aortic aneurysm. Great vessels are grossly patent.  Mediastinum/Nodes: No abnormal mediastinal adenopathy. Esophagus is unremarkable. No pericardial effusion.  Lungs/Pleura: There is a large left lower lobe mass extending from the left hilum to the left lung base measuring 3.3 x 4.7 x 3.3 cm worrisome for malignancy. It is lobulated and somewhat low density. There is a polypoid element projecting into the left lower lobe lobar airway. There is also occlusion of the left lower lobe bronchus.  No pneumothorax.  No pleural effusion.  Upper Abdomen: The mass extends through the diaphragm and appears to invade the spleen.  Musculoskeletal: No vertebral compression deformity.  Review of the MIP images confirms the above findings.  IMPRESSION: Large left lower lobe lung mass extending through the diaphragm in and invading the spleen associated with occlusion of the left lower lobe bronchus. Findings are worrisome for malignancy. PET-CT is recommended.  No evidence of pulmonary thromboembolism   Electronically Signed   By: AMarybelle KillingsM.D.   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    I reviewed with the patient and her husband the CT, PET  and bronchoscopy findings.  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  I have recommended that we proceed with left lower lobectomy, with possible sleeve resection.  The patient is agreeable she and her husband's questions have been answered in detail.   The goals risks and alternatives of the planned surgical procedure bronchoscopy left video-assisted thoracoscopy left lower lobe lung resection possible sleeve resection have been discussed with the patient in detail. The risks of the procedure including death, infection, stroke, myocardial infarction, bleeding, blood transfusion, persistent air leak have all been discussed specifically.  I have quoted CGeraldine Contrasa 2 % of perioperative mortality and a complication rate as high as 30%. The patient's questions have been answered.Patricia FUENTEis willing  to proceed with the planned procedure tomorrow.   EGrace IsaacMD      [address] [place] [id number] [phone]  Beeper [phone] 12/28/2017 12:36  PM

## 2017-12-28 NOTE — Anesthesia Preprocedure Evaluation (Addendum)
Anesthesia Evaluation  Patient identified by MRN, date of birth, ID band Patient awake    Reviewed: Allergy & Precautions, NPO status , Patient's Chart, lab work & pertinent test results  History of Anesthesia Complications Negative for: history of anesthetic complications  Airway Mallampati: II  TM Distance: >3 FB Neck ROM: Full    Dental no notable dental hx. (+) Dental Advisory Given   Pulmonary pneumonia,    Pulmonary exam normal        Cardiovascular negative cardio ROS Normal cardiovascular exam     Neuro/Psych negative neurological ROS  negative psych ROS   GI/Hepatic negative GI ROS, Neg liver ROS,   Endo/Other  negative endocrine ROS  Renal/GU negative Renal ROS     Musculoskeletal negative musculoskeletal ROS (+)   Abdominal   Peds  Hematology negative hematology ROS (+)   Anesthesia Other Findings Day of surgery medications reviewed with the patient.  Reproductive/Obstetrics                            Anesthesia Physical Anesthesia Plan  ASA: III  Anesthesia Plan: General   Post-op Pain Management:    Induction: Intravenous  PONV Risk Score and Plan: 3 and Ondansetron, Dexamethasone and Scopolamine patch - Pre-op  Airway Management Planned: Oral ETT and Double Lumen EBT  Additional Equipment: Arterial line  Intra-op Plan:   Post-operative Plan: Extubation in OR  Informed Consent: I have reviewed the patients History and Physical, chart, labs and discussed the procedure including the risks, benefits and alternatives for the proposed anesthesia with the patient or authorized representative who has indicated his/her understanding and acceptance.   Dental advisory given  Plan Discussed with: CRNA, Anesthesiologist and Surgeon  Anesthesia Plan Comments:        Anesthesia Quick Evaluation

## 2017-12-29 ENCOUNTER — Inpatient Hospital Stay (HOSPITAL_COMMUNITY)
Admission: RE | Admit: 2017-12-29 | Discharge: 2018-01-06 | DRG: 164 | Disposition: A | Discharge status: Home / Self Care | Payer: Managed Care, Other (non HMO) | Attending: Cardiothoracic Surgery | Admitting: Cardiothoracic Surgery

## 2017-12-29 ENCOUNTER — Encounter (HOSPITAL_COMMUNITY)
Admission: RE | Disposition: A | Discharge status: Home / Self Care | Payer: Self-pay | Source: Home / Self Care | Attending: Cardiothoracic Surgery

## 2017-12-29 ENCOUNTER — Inpatient Hospital Stay (HOSPITAL_COMMUNITY): Payer: Managed Care, Other (non HMO) | Admitting: Anesthesiology

## 2017-12-29 ENCOUNTER — Inpatient Hospital Stay (HOSPITAL_COMMUNITY): Payer: Managed Care, Other (non HMO)

## 2017-12-29 ENCOUNTER — Other Ambulatory Visit: Payer: Self-pay

## 2017-12-29 ENCOUNTER — Encounter (HOSPITAL_COMMUNITY): Payer: Self-pay | Admitting: Urology

## 2017-12-29 DIAGNOSIS — D3A090 Benign carcinoid tumor of the bronchus and lung: Secondary | ICD-10-CM | POA: Diagnosis present

## 2017-12-29 DIAGNOSIS — R222 Localized swelling, mass and lump, trunk: Secondary | ICD-10-CM

## 2017-12-29 DIAGNOSIS — Z09 Encounter for follow-up examination after completed treatment for conditions other than malignant neoplasm: Secondary | ICD-10-CM

## 2017-12-29 DIAGNOSIS — Z4682 Encounter for fitting and adjustment of non-vascular catheter: Secondary | ICD-10-CM

## 2017-12-29 DIAGNOSIS — J939 Pneumothorax, unspecified: Secondary | ICD-10-CM | POA: Diagnosis not present

## 2017-12-29 DIAGNOSIS — C7B09 Secondary carcinoid tumors of other sites: Secondary | ICD-10-CM

## 2017-12-29 DIAGNOSIS — Z8701 Personal history of pneumonia (recurrent): Secondary | ICD-10-CM | POA: Diagnosis not present

## 2017-12-29 DIAGNOSIS — R042 Hemoptysis: Secondary | ICD-10-CM | POA: Diagnosis present

## 2017-12-29 DIAGNOSIS — I10 Essential (primary) hypertension: Secondary | ICD-10-CM | POA: Diagnosis present

## 2017-12-29 DIAGNOSIS — Z9689 Presence of other specified functional implants: Secondary | ICD-10-CM

## 2017-12-29 DIAGNOSIS — J9382 Other air leak: Secondary | ICD-10-CM | POA: Diagnosis not present

## 2017-12-29 DIAGNOSIS — R918 Other nonspecific abnormal finding of lung field: Secondary | ICD-10-CM | POA: Diagnosis present

## 2017-12-29 DIAGNOSIS — C7A00 Malignant carcinoid tumor of unspecified site: Secondary | ICD-10-CM | POA: Diagnosis present

## 2017-12-29 DIAGNOSIS — Z8052 Family history of malignant neoplasm of bladder: Secondary | ICD-10-CM | POA: Diagnosis not present

## 2017-12-29 HISTORY — PX: VIDEO BRONCHOSCOPY: SHX5072

## 2017-12-29 HISTORY — PX: VIDEO ASSISTED THORACOSCOPY (VATS)/WEDGE RESECTION: SHX6174

## 2017-12-29 LAB — GLUCOSE, CAPILLARY: Glucose-Capillary: 140 mg/dL — ABNORMAL HIGH (ref 70–99)

## 2017-12-29 LAB — POCT I-STAT 7, (LYTES, BLD GAS, ICA,H+H)
Bicarbonate: 26.6 mmol/L (ref 20.0–28.0)
Bicarbonate: 26.2 mmol/L (ref 20.0–28.0)
Calcium, Ion: 1.16 mmol/L (ref 1.15–1.40)
Calcium, Ion: 1.13 mmol/L — ABNORMAL LOW (ref 1.15–1.40)
HCT: 37 % (ref 36.0–46.0)
HCT: 37 % (ref 36.0–46.0)
Hemoglobin: 12.6 g/dL (ref 12.0–15.0)
Hemoglobin: 12.6 g/dL (ref 12.0–15.0)
O2 Saturation: 100 %
O2 Saturation: 100 %
Patient temperature: 36.5
Patient temperature: 37.2
Potassium: 4.2 mmol/L (ref 3.5–5.1)
Potassium: 3.8 mmol/L (ref 3.5–5.1)
Sodium: 139 mmol/L (ref 135–145)
Sodium: 138 mmol/L (ref 135–145)
TCO2: 28 mmol/L (ref 22–32)
TCO2: 28 mmol/L (ref 22–32)
pCO2 arterial: 48.1 mmHg — ABNORMAL HIGH (ref 32.0–48.0)
pCO2 arterial: 47.9 mmHg (ref 32.0–48.0)
pH, Arterial: 7.349 — ABNORMAL LOW (ref 7.350–7.450)
pH, Arterial: 7.347 — ABNORMAL LOW (ref 7.350–7.450)
pO2, Arterial: 262 mmHg — ABNORMAL HIGH (ref 83.0–108.0)
pO2, Arterial: 414 mmHg — ABNORMAL HIGH (ref 83.0–108.0)

## 2017-12-29 LAB — POCT PREGNANCY, URINE: Preg Test, Ur: NEGATIVE

## 2017-12-29 SURGERY — BRONCHOSCOPY, VIDEO-ASSISTED
Anesthesia: General | Site: Chest

## 2017-12-29 MED ORDER — POTASSIUM CHLORIDE 10 MEQ/50ML IV SOLN: 10.0000 meq | Freq: Every day | INTRAVENOUS | Status: DC | PRN

## 2017-12-29 MED ORDER — SCOPOLAMINE 1 MG/3DAYS TD PT72
1.0000 | MEDICATED_PATCH | TRANSDERMAL | Status: DC
  Administered 2017-12-29: 1.5 mg via TRANSDERMAL

## 2017-12-29 MED ORDER — ROCURONIUM BROMIDE 100 MG/10ML IV SOLN
INTRAVENOUS | Status: DC | PRN
  Administered 2017-12-29 (×3): 20 mg via INTRAVENOUS
  Administered 2017-12-29: 30 mg via INTRAVENOUS
  Administered 2017-12-29 (×2): 10 mg via INTRAVENOUS
  Administered 2017-12-29 (×2): 20 mg via INTRAVENOUS
  Administered 2017-12-29: 10 mg via INTRAVENOUS
  Administered 2017-12-29: 70 mg via INTRAVENOUS
  Administered 2017-12-29 (×2): 20 mg via INTRAVENOUS

## 2017-12-29 MED ORDER — ONDANSETRON HCL 4 MG/2ML IJ SOLN: 4.0000 mg | Freq: Four times a day (QID) | INTRAMUSCULAR | Status: DC | PRN

## 2017-12-29 MED ORDER — DIPHENHYDRAMINE HCL 12.5 MG/5ML PO ELIX
12.5000 mg | ORAL_SOLUTION | Freq: Four times a day (QID) | ORAL | Status: DC | PRN
  Filled 2017-12-29: qty 5

## 2017-12-29 MED ORDER — LIDOCAINE HCL (CARDIAC) PF 100 MG/5ML IV SOSY
PREFILLED_SYRINGE | INTRAVENOUS | Status: DC | PRN
  Administered 2017-12-29: 60 mg via INTRAVENOUS

## 2017-12-29 MED ORDER — MIDAZOLAM HCL 5 MG/5ML IJ SOLN
INTRAMUSCULAR | Status: DC | PRN
  Administered 2017-12-29 (×2): 2 mg via INTRAVENOUS

## 2017-12-29 MED ORDER — MIDAZOLAM HCL 2 MG/2ML IJ SOLN
INTRAMUSCULAR | Status: AC
  Filled 2017-12-29: qty 2

## 2017-12-29 MED ORDER — DIPHENHYDRAMINE HCL 50 MG/ML IJ SOLN: 12.5000 mg | Freq: Four times a day (QID) | INTRAMUSCULAR | Status: DC | PRN

## 2017-12-29 MED ORDER — BISACODYL 5 MG PO TBEC
10.0000 mg | DELAYED_RELEASE_TABLET | Freq: Every day | ORAL | Status: DC
  Administered 2017-12-30 – 2018-01-05 (×4): 10 mg via ORAL
  Filled 2017-12-29 (×7): qty 2

## 2017-12-29 MED ORDER — DEXAMETHASONE SODIUM PHOSPHATE 10 MG/ML IJ SOLN
INTRAMUSCULAR | Status: DC | PRN
  Administered 2017-12-29: 10 mg via INTRAVENOUS

## 2017-12-29 MED ORDER — SCOPOLAMINE 1 MG/3DAYS TD PT72
MEDICATED_PATCH | TRANSDERMAL | Status: AC
  Administered 2017-12-29: 1.5 mg via TRANSDERMAL
  Filled 2017-12-29: qty 1

## 2017-12-29 MED ORDER — FENTANYL CITRATE (PF) 250 MCG/5ML IJ SOLN
INTRAMUSCULAR | Status: AC
  Filled 2017-12-29: qty 5

## 2017-12-29 MED ORDER — HYDROMORPHONE HCL 1 MG/ML IJ SOLN
0.2500 mg | INTRAMUSCULAR | Status: DC | PRN
  Administered 2017-12-29 (×2): 0.5 mg via INTRAVENOUS

## 2017-12-29 MED ORDER — SENNOSIDES-DOCUSATE SODIUM 8.6-50 MG PO TABS
1.0000 | ORAL_TABLET | Freq: Every day | ORAL | Status: DC
  Administered 2017-12-30 – 2018-01-04 (×6): 1 via ORAL
  Filled 2017-12-29 (×6): qty 1

## 2017-12-29 MED ORDER — KCL IN DEXTROSE-NACL 20-5-0.9 MEQ/L-%-% IV SOLN
INTRAVENOUS | Status: DC
  Administered 2017-12-29 – 2017-12-30 (×2): via INTRAVENOUS
  Filled 2017-12-29 (×2): qty 1000

## 2017-12-29 MED ORDER — SUGAMMADEX SODIUM 500 MG/5ML IV SOLN
INTRAVENOUS | Status: DC | PRN
  Administered 2017-12-29: 200 mg via INTRAVENOUS

## 2017-12-29 MED ORDER — BUPIVACAINE HCL 0.5 % IJ SOLN
INTRAMUSCULAR | Status: DC | PRN
  Administered 2017-12-29: 5 mL

## 2017-12-29 MED ORDER — BUPIVACAINE HCL (PF) 0.5 % IJ SOLN
INTRAMUSCULAR | Status: AC
  Filled 2017-12-29: qty 10

## 2017-12-29 MED ORDER — SODIUM CHLORIDE 0.9% FLUSH: 9.0000 mL | INTRAVENOUS | Status: DC | PRN

## 2017-12-29 MED ORDER — HYDROMORPHONE HCL 1 MG/ML IJ SOLN
INTRAMUSCULAR | Status: DC | PRN
  Administered 2017-12-29: 0.5 mg via INTRAVENOUS

## 2017-12-29 MED ORDER — ACETAMINOPHEN 160 MG/5ML PO SOLN: 1000.0000 mg | Freq: Four times a day (QID) | ORAL | Status: AC

## 2017-12-29 MED ORDER — HYDROMORPHONE HCL 1 MG/ML IJ SOLN
INTRAMUSCULAR | Status: AC
  Filled 2017-12-29: qty 1

## 2017-12-29 MED ORDER — PROMETHAZINE HCL 25 MG/ML IJ SOLN: 6.2500 mg | INTRAMUSCULAR | Status: DC | PRN

## 2017-12-29 MED ORDER — 0.9 % SODIUM CHLORIDE (POUR BTL) OPTIME
TOPICAL | Status: DC | PRN
  Administered 2017-12-29: 2000 mL

## 2017-12-29 MED ORDER — BUPIVACAINE 0.5 % ON-Q PUMP SINGLE CATH 400 ML
400.0000 mL | INJECTION | Status: DC
  Filled 2017-12-29: qty 400

## 2017-12-29 MED ORDER — LACTATED RINGERS IV SOLN
INTRAVENOUS | Status: DC | PRN
  Administered 2017-12-29 (×2): via INTRAVENOUS

## 2017-12-29 MED ORDER — NALOXONE HCL 0.4 MG/ML IJ SOLN: 0.4000 mg | INTRAMUSCULAR | Status: DC | PRN

## 2017-12-29 MED ORDER — FENTANYL CITRATE (PF) 100 MCG/2ML IJ SOLN
INTRAMUSCULAR | Status: DC | PRN
  Administered 2017-12-29: 25 ug via INTRAVENOUS
  Administered 2017-12-29 (×2): 50 ug via INTRAVENOUS
  Administered 2017-12-29: 100 ug via INTRAVENOUS
  Administered 2017-12-29: 50 ug via INTRAVENOUS
  Administered 2017-12-29: 25 ug via INTRAVENOUS
  Administered 2017-12-29 (×3): 50 ug via INTRAVENOUS
  Administered 2017-12-29: 100 ug via INTRAVENOUS
  Administered 2017-12-29 (×4): 50 ug via INTRAVENOUS

## 2017-12-29 MED ORDER — HEMOSTATIC AGENTS (NO CHARGE) OPTIME
TOPICAL | Status: DC | PRN
  Administered 2017-12-29 (×4): 1 via TOPICAL

## 2017-12-29 MED ORDER — PROPOFOL 10 MG/ML IV BOLUS
INTRAVENOUS | Status: DC | PRN
  Administered 2017-12-29: 200 mg via INTRAVENOUS
  Administered 2017-12-29: 30 mg via INTRAVENOUS

## 2017-12-29 MED ORDER — ACETAMINOPHEN 500 MG PO TABS
1000.0000 mg | ORAL_TABLET | Freq: Four times a day (QID) | ORAL | Status: AC
  Administered 2017-12-29 – 2018-01-03 (×17): 1000 mg via ORAL
  Filled 2017-12-29 (×19): qty 2

## 2017-12-29 MED ORDER — LACTATED RINGERS IV SOLN
INTRAVENOUS | Status: DC | PRN
  Administered 2017-12-29 (×2): via INTRAVENOUS

## 2017-12-29 MED ORDER — BUPIVACAINE 0.5 % ON-Q PUMP SINGLE CATH 400 ML
INJECTION | Status: AC | PRN
  Administered 2017-12-29: 400 mL

## 2017-12-29 MED ORDER — PRENATAL MULTIVITAMIN CH
1.0000 | ORAL_TABLET | Freq: Every day | ORAL | Status: DC
  Administered 2017-12-30 – 2018-01-05 (×7): 1 via ORAL
  Filled 2017-12-29 (×10): qty 1

## 2017-12-29 MED ORDER — PROPOFOL 10 MG/ML IV BOLUS
INTRAVENOUS | Status: AC
  Filled 2017-12-29: qty 20

## 2017-12-29 MED ORDER — FENTANYL 40 MCG/ML IV SOLN
INTRAVENOUS | Status: DC
  Administered 2017-12-29: 18:00:00 via INTRAVENOUS
  Administered 2017-12-29: 135 ug via INTRAVENOUS
  Administered 2017-12-30: 105 ug via INTRAVENOUS
  Administered 2017-12-30: 150 ug via INTRAVENOUS
  Administered 2017-12-30: 1000 ug via INTRAVENOUS
  Administered 2017-12-30: 150 ug via INTRAVENOUS
  Administered 2017-12-31: 90 ug via INTRAVENOUS
  Administered 2017-12-31: 45 ug via INTRAVENOUS
  Filled 2017-12-29 (×3): qty 25

## 2017-12-29 MED ORDER — PHENYLEPHRINE HCL 10 MG/ML IJ SOLN
INTRAMUSCULAR | Status: DC | PRN
  Administered 2017-12-29: 50 ug/min via INTRAVENOUS

## 2017-12-29 MED ORDER — CEFAZOLIN SODIUM-DEXTROSE 2-4 GM/100ML-% IV SOLN
2.0000 g | Freq: Three times a day (TID) | INTRAVENOUS | Status: AC
  Administered 2017-12-29 – 2017-12-30 (×2): 2 g via INTRAVENOUS
  Filled 2017-12-29 (×2): qty 100

## 2017-12-29 MED ORDER — DEXMEDETOMIDINE HCL 200 MCG/2ML IV SOLN
INTRAVENOUS | Status: DC | PRN
  Administered 2017-12-29 (×3): 8 ug via INTRAVENOUS

## 2017-12-29 MED ORDER — INSULIN ASPART 100 UNIT/ML ~~LOC~~ SOLN
0.0000 [IU] | Freq: Four times a day (QID) | SUBCUTANEOUS | Status: DC
  Administered 2017-12-29 – 2018-01-01 (×4): 2 [IU] via SUBCUTANEOUS

## 2017-12-29 MED ORDER — CEFAZOLIN SODIUM-DEXTROSE 2-4 GM/100ML-% IV SOLN
INTRAVENOUS | Status: AC
  Filled 2017-12-29: qty 100

## 2017-12-29 MED ORDER — CEFAZOLIN SODIUM-DEXTROSE 2-4 GM/100ML-% IV SOLN
2.0000 g | INTRAVENOUS | Status: AC
  Administered 2017-12-29 (×2): 2 g via INTRAVENOUS

## 2017-12-29 MED ORDER — ORAL CARE MOUTH RINSE
15.0000 mL | Freq: Two times a day (BID) | OROMUCOSAL | Status: DC
  Administered 2017-12-29 – 2018-01-01 (×7): 15 mL via OROMUCOSAL

## 2017-12-29 MED ORDER — HYDROMORPHONE HCL 1 MG/ML IJ SOLN
INTRAMUSCULAR | Status: AC
  Filled 2017-12-29: qty 0.5

## 2017-12-29 MED ORDER — TRAMADOL HCL 50 MG PO TABS
50.0000 mg | ORAL_TABLET | Freq: Four times a day (QID) | ORAL | Status: DC | PRN
  Administered 2017-12-31 – 2018-01-03 (×9): 100 mg via ORAL
  Administered 2018-01-04 (×2): 50 mg via ORAL
  Administered 2018-01-04 – 2018-01-05 (×2): 100 mg via ORAL
  Filled 2017-12-29 (×6): qty 2
  Filled 2017-12-29: qty 1
  Filled 2017-12-29 (×4): qty 2
  Filled 2017-12-29: qty 1
  Filled 2017-12-29: qty 2

## 2017-12-29 MED ORDER — ONDANSETRON HCL 4 MG/2ML IJ SOLN
INTRAMUSCULAR | Status: DC | PRN
  Administered 2017-12-29: 4 mg via INTRAVENOUS

## 2017-12-29 SURGICAL SUPPLY — 94 items
APPLICATOR TIP COSEAL (VASCULAR PRODUCTS) IMPLANT
APPLICATOR TIP EXT COSEAL (VASCULAR PRODUCTS) IMPLANT
BLADE SURG 11 STRL SS (BLADE) IMPLANT
BRUSH CYTOL CELLEBRITY 1.5X140 (MISCELLANEOUS) IMPLANT
CANISTER SUCT 3000ML PPV (MISCELLANEOUS) ×3 IMPLANT
CATH KIT ON Q 5IN SLV (PAIN MANAGEMENT) IMPLANT
CATH KIT ON-Q SILVERSOAK 5IN (CATHETERS) ×3 IMPLANT
CATH THORACIC 28FR (CATHETERS) IMPLANT
CATH THORACIC 36FR (CATHETERS) IMPLANT
CATH THORACIC 36FR RT ANG (CATHETERS) IMPLANT
CLIP VESOCCLUDE MED 6/CT (CLIP) ×3 IMPLANT
CONN ST 1/4X3/8  BEN (MISCELLANEOUS) ×2
CONN ST 1/4X3/8 BEN (MISCELLANEOUS) ×4 IMPLANT
CONT SPEC 4OZ CLIKSEAL STRL BL (MISCELLANEOUS) ×27 IMPLANT
COVER BACK TABLE 60X90IN (DRAPES) ×3 IMPLANT
DERMABOND ADVANCED (GAUZE/BANDAGES/DRESSINGS) ×1
DERMABOND ADVANCED .7 DNX12 (GAUZE/BANDAGES/DRESSINGS) ×2 IMPLANT
DRAIN CHANNEL 28F RND 3/8 FF (WOUND CARE) IMPLANT
DRAIN CHANNEL 32F RND 10.7 FF (WOUND CARE) IMPLANT
DRAPE LAPAROSCOPIC ABDOMINAL (DRAPES) ×3 IMPLANT
DRAPE WARM FLUID 44X44 (DRAPE) ×3 IMPLANT
DRILL BIT 7/64X5 (BIT) ×3 IMPLANT
ELECT BLADE 4.0 EZ CLEAN MEGAD (MISCELLANEOUS) ×3
ELECT BLADE 6.5 EXT (BLADE) ×3 IMPLANT
ELECT REM PT RETURN 9FT ADLT (ELECTROSURGICAL) ×3
ELECTRODE BLDE 4.0 EZ CLN MEGD (MISCELLANEOUS) ×2 IMPLANT
ELECTRODE REM PT RTRN 9FT ADLT (ELECTROSURGICAL) ×2 IMPLANT
FORCEPS BIOP RJ4 1.8 (CUTTING FORCEPS) IMPLANT
GAUZE SPONGE 4X4 12PLY STRL (GAUZE/BANDAGES/DRESSINGS) ×3 IMPLANT
GAUZE SPONGE 4X4 12PLY STRL LF (GAUZE/BANDAGES/DRESSINGS) ×3 IMPLANT
GLOVE BIO SURGEON STRL SZ 6.5 (GLOVE) ×6 IMPLANT
GOWN STRL REUS W/ TWL LRG LVL3 (GOWN DISPOSABLE) ×12 IMPLANT
GOWN STRL REUS W/TWL LRG LVL3 (GOWN DISPOSABLE) ×6
HEMOSTAT SURGICEL 2X14 (HEMOSTASIS) ×9 IMPLANT
KIT BASIN OR (CUSTOM PROCEDURE TRAY) ×3 IMPLANT
KIT CLEAN ENDO COMPLIANCE (KITS) ×3 IMPLANT
KIT SUCTION CATH 14FR (SUCTIONS) ×3 IMPLANT
KIT TURNOVER KIT B (KITS) ×3 IMPLANT
MARKER SKIN DUAL TIP RULER LAB (MISCELLANEOUS) ×3 IMPLANT
NS IRRIG 1000ML POUR BTL (IV SOLUTION) ×6 IMPLANT
OIL SILICONE PENTAX (PARTS (SERVICE/REPAIRS)) ×3 IMPLANT
PACK CHEST (CUSTOM PROCEDURE TRAY) ×3 IMPLANT
PAD ARMBOARD 7.5X6 YLW CONV (MISCELLANEOUS) ×9 IMPLANT
PASSER SUT SWANSON 36MM LOOP (INSTRUMENTS) ×3 IMPLANT
POUCH ENDO CATCH II 15MM (MISCELLANEOUS) ×3 IMPLANT
PROGEL SPRAY TIP 11IN (MISCELLANEOUS) ×3
RELOAD STAPLER LINEAR PROX 30 (STAPLE) ×2 IMPLANT
SCISSORS LAP 5X35 DISP (ENDOMECHANICALS) IMPLANT
SEALANT PROGEL (MISCELLANEOUS) ×3 IMPLANT
SEALANT SURG COSEAL 4ML (VASCULAR PRODUCTS) IMPLANT
SEALANT SURG COSEAL 8ML (VASCULAR PRODUCTS) IMPLANT
SOLUTION ANTI FOG 6CC (MISCELLANEOUS) ×3 IMPLANT
SPONGE INTESTINAL PEANUT (DISPOSABLE) ×18 IMPLANT
SPONGE LAP 18X18 X RAY DECT (DISPOSABLE) ×3 IMPLANT
STAPLE RELOAD 2.5MM WHITE (STAPLE) ×12 IMPLANT
STAPLER RELOAD LINEAR PROX 30 (STAPLE) ×3
STAPLER VASCULAR ECHELON 35 (CUTTER) ×3 IMPLANT
SUT PROLENE 3 0 SH DA (SUTURE) IMPLANT
SUT PROLENE 4 0 RB 1 (SUTURE)
SUT PROLENE 4-0 RB1 .5 CRCL 36 (SUTURE) IMPLANT
SUT SILK  1 MH (SUTURE) ×4
SUT SILK 1 MH (SUTURE) ×8 IMPLANT
SUT SILK 1 TIES 10X30 (SUTURE) ×3 IMPLANT
SUT SILK 2 0 SH (SUTURE) IMPLANT
SUT SILK 2 0SH CR/8 30 (SUTURE) ×3 IMPLANT
SUT SILK 3 0SH CR/8 30 (SUTURE) IMPLANT
SUT STEEL 1 (SUTURE) IMPLANT
SUT VIC AB 0 CTX 18 (SUTURE) ×3 IMPLANT
SUT VIC AB 1 CTX 18 (SUTURE) ×6 IMPLANT
SUT VIC AB 1 CTX 36 (SUTURE)
SUT VIC AB 1 CTX36XBRD ANBCTR (SUTURE) IMPLANT
SUT VIC AB 2-0 CTX 36 (SUTURE) ×3 IMPLANT
SUT VIC AB 2-0 UR6 27 (SUTURE) IMPLANT
SUT VIC AB 3-0 SH 8-18 (SUTURE) ×3 IMPLANT
SUT VIC AB 3-0 X1 27 (SUTURE) ×3 IMPLANT
SUT VICRYL 0 UR6 27IN ABS (SUTURE) IMPLANT
SUT VICRYL 2 TP 1 (SUTURE) ×3 IMPLANT
SYR 20ML ECCENTRIC (SYRINGE) ×3 IMPLANT
SYSTEM SAHARA CHEST DRAIN ATS (WOUND CARE) ×3 IMPLANT
TAPE CLOTH SURG 4X10 WHT LF (GAUZE/BANDAGES/DRESSINGS) ×3 IMPLANT
TAPE UMBILICAL COTTON 1/8X30 (MISCELLANEOUS) ×3 IMPLANT
TIP SPRAY PROGEL 11IN (MISCELLANEOUS) ×2 IMPLANT
TOWEL GREEN STERILE (TOWEL DISPOSABLE) ×3 IMPLANT
TOWEL GREEN STERILE FF (TOWEL DISPOSABLE) ×3 IMPLANT
TRAP SPECIMEN MUCOUS 40CC (MISCELLANEOUS) IMPLANT
TRAY FOLEY MTR SLVR 16FR STAT (SET/KITS/TRAYS/PACK) ×3 IMPLANT
TROCAR BLADELESS 12MM (ENDOMECHANICALS) IMPLANT
TROCAR XCEL 12X100 BLDLESS (ENDOMECHANICALS) IMPLANT
TROCAR XCEL NON-BLD 5MMX100MML (ENDOMECHANICALS) ×3 IMPLANT
TUBE CONNECTING 12X1/4 (SUCTIONS) ×3 IMPLANT
TUBE CONNECTING 20X1/4 (TUBING) ×3 IMPLANT
TUNNELER SHEATH ON-Q 11GX8 DSP (PAIN MANAGEMENT) ×3 IMPLANT
VALVE DISPOSABLE (MISCELLANEOUS) ×3 IMPLANT
WATER STERILE IRR 1000ML POUR (IV SOLUTION) ×3 IMPLANT

## 2017-12-29 NOTE — Anesthesia Procedure Notes (Signed)
Arterial Line Insertion Start/End7/05/2018 6:55 AM, 12/29/2017 7:10 AM Performed by: Neldon Newport, CRNA, CRNA  Patient location: Pre-op. Patient sedated Right, radial was placed Catheter size: 20 G Hand hygiene performed  and maximum sterile barriers used   Attempts: 3 Procedure performed without using ultrasound guided technique. Following insertion, dressing applied and Biopatch. Post procedure assessment: normal and unchanged  Patient tolerated the procedure well with no immediate complications.

## 2017-12-29 NOTE — Anesthesia Procedure Notes (Signed)
Procedure Name: Intubation Date/Time: 12/29/2017 8:03 AM Performed by: Neldon Newport, CRNA Pre-anesthesia Checklist: Patient identified, Emergency Drugs available, Suction available and Patient being monitored Patient Re-evaluated:Patient Re-evaluated prior to induction Oxygen Delivery Method: Circle System Utilized Preoxygenation: Pre-oxygenation with 100% oxygen Induction Type: IV induction Ventilation: Mask ventilation without difficulty Grade View: Grade I Tube type: Oral Endobronchial tube: Left and Double lumen EBT and 37 Fr Number of attempts: 1 Airway Equipment and Method: Stylet,  Oral airway and Video-laryngoscopy Placement Confirmation: ETT inserted through vocal cords under direct vision,  positive ETCO2 and breath sounds checked- equal and bilateral Tube secured with: Tape Dental Injury: Injury to lip

## 2017-12-29 NOTE — Anesthesia Procedure Notes (Signed)
Procedure Name: Intubation Performed by: Neldon Newport, CRNA Pre-anesthesia Checklist: Patient identified, Emergency Drugs available, Suction available and Patient being monitored Patient Re-evaluated:Patient Re-evaluated prior to induction Oxygen Delivery Method: Circle System Utilized Preoxygenation: Pre-oxygenation with 100% oxygen Induction Type: IV induction Ventilation: Mask ventilation without difficulty Laryngoscope Size: Mac and 3 Grade View: Grade I Tube type: Oral Tube size: 8.5 mm Number of attempts: 1 Airway Equipment and Method: Stylet and Oral airway Placement Confirmation: ETT inserted through vocal cords under direct vision,  positive ETCO2 and breath sounds checked- equal and bilateral Secured at: 22 cm Tube secured with: Tape Dental Injury: Teeth and Oropharynx as per pre-operative assessment

## 2017-12-29 NOTE — H&P (Signed)
[address]       [place] [address]             [phone]                    [name] [place] Medical Record #[id number] Date of Birth: [birth year]  Referring: No ref. provider found Primary Care: Patient, No Pcp Per Primary Cardiologist: No primary care provider on file.  Chief Complaint:    Lung Mass  History of Present Illness:    Patricia Glenn 32 y.o. female is seen in the office  today for further evaluation and consideration of resection  of left lung mass.  In March 2018 the patient was treated for a case of left lower lobe pneumonia.  Symptoms cleared at that time but in the winter 2019 she had further episodes of left lower lobe pneumonia.  About a month ago she had an episode of hemoptysis, leading to a CT scan.  Bronchoscopy was performed by Dr. Jamal Collin a smooth bordered left lower lobe lung mass just at the level of the superior segment was noted.  During this time she was pregnant with her third child.  She delivered her baby 3 to 4 weeks early, her original due date was today.  She has had no further hemoptysis  Patient has never been a smoker  Since last seen PET scan has been done and formal pulmonary function studies.  Current Activity/ Functional Status:  Patient is independent with mobility/ambulation, transfers, ADL's, IADL's.   Zubrod Score: At the time of surgery this patient's most appropriate activity status/level should be described as: _0     0    Normal activity, no symptoms _1     1    Restricted in physical strenuous activity but ambulatory, able to do out light work _2     2    Ambulatory and capable of self care, unable to do work activities, up and about               >50 % of waking hours                              _3     3    Only limited self care, in bed greater than 50% of waking hours _4     4    Completely disabled, no self care, confined to bed or chair _5     5    Moribund   Past Medical History:    Diagnosis Date  . Encounter for induction of labor 11/17/2017  . Encounter for supervision of other normal pregnancy, unspecified trimester 05/23/2017   31 y.o. G3P2002 at 22w1dby Patient's last menstrual period was 03/13/2017. Estimated Date of Delivery: 12/18/17  c/w 147w1dltrasound Sex of baby and name:  " "  Factors complicating this pregnancy   LLL pneumonia 24 weeks  Inpatient  Repeat CXR 3 weeks post _6   Elevated BP at NOB- watch closely; baseline labs ordered and WNL-05/23/17  Hx of LGA , previous babies weighed 4309 grams and 4139 gra  . 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 Lung mass   . Mass of lower lobe of left lung 11/17/2017  . Pneumonia complicating pregnancy, second trimester 08/29/2017    Past Surgical History:  Procedure Laterality Date  . FLEXIBLE BRONCHOSCOPY N/A 11/29/2017   Procedure:  FLEXIBLE BRONCHOSCOPY;  Surgeon: Wilhelmina Mcardle, MD;  Location: [facility] ORS;  Service: Pulmonary;  Laterality: N/A;    Family History  Problem Relation Age of Onset  . Bladder Cancer Maternal Grandfather 71     Social History   Tobacco Use  Smoking Status Never Smoker  Smokeless Tobacco Never Used    Social History   Substance and Sexual Activity  Alcohol Use Yes  . Frequency: Never   Comment: once a month/year none recently d/t pregnancy     No Known Allergies  Current Facility-Administered Medications  Medication Dose Route Frequency Provider Last Rate Last Dose  . ceFAZolin (ANCEF) 2-4 GM/100ML-% IVPB           . ceFAZolin (ANCEF) IVPB 2g/100 mL premix  2 g Intravenous 30 min Pre-Op Grace Isaac, MD      . midazolam (VERSED) 2 MG/2ML injection           . scopolamine (TRANSDERM-SCOP) 1 MG/3DAYS 1.5 mg  1 patch Transdermal Q72H Duane Boston, MD   1.5 mg at 12/29/17 0551    Pertinent items are noted in HPI.   Review of Systems:     Cardiac Review of Systems: [Y] = yes  or   [ N ] = no   Chest Pain [  y left ]  Resting SOB  [   n] Exertional SOB  [  ]  Orthopnea [ n ]   Pedal Edema [n   ]    Palpitations [n  ] Syncope  Florencio.Farrier  ]   Prensyncope [ n  ]   General Review of Systems: [Y] = yes [  ]=no Constitional: recent weight change [  ];  Wt loss over the last 3 months [   ] anorexia [  ]; fatigue [  ]; nausea [  ]; night sweats [  ]; fever [  ]; or chills [  ];           Eye : blurred vision [  ]; diplopia [   ]; vision changes [  ];  Amaurosis fugax[  ]; Resp: cough [  ];  wheezing[  ];  hemoptysis[  ]; shortness of breath[  ]; paroxysmal nocturnal dyspnea[  ]; dyspnea on exertion[  ]; or orthopnea[  ];  GI:  gallstones[  ], vomiting[  ];  dysphagia[  ]; melena[  ];  hematochezia [  ]; heartburn[  ];   Hx of  Colonoscopy[  ]; GU: kidney stones [  ]; hematuria[  ];   dysuria [  ];  nocturia[  ];  history of     obstruction [  ]; urinary frequency [  ]             Skin: rash, swelling[  ];, hair loss[  ];  peripheral edema[  ];  or itching[  ]; Musculosketetal: myalgias[  ];  joint swelling[  ];  joint erythema[  ];  joint pain[  ];  back pain[  ];  Heme/Lymph: bruising[  ];  bleeding[  ];  anemia[  ];  Neuro: TIA[  ];  headaches[  ];  stroke[  ];  vertigo[  ];  seizures[  ];   paresthesias[  ];  difficulty walking[  ];  Psych:depression[  ]; anxiety[  ];  Endocrine: diabetes[  ];  thyroid dysfunction[  ];  Immunizations: Flu up to date [  ]; Pneumococcal up to date [  ];  Other:     PHYSICAL  EXAMINATION: BP (!) 163/99   Pulse 81   Temp 97.8 F (36.6 C) (Oral)   Resp 20   SpO2 100%  General appearance: alert, cooperative, appears stated age and no distress Head: Normocephalic, without obvious abnormality, atraumatic Neck: no adenopathy, no carotid bruit, no JVD, supple, symmetrical, trachea midline and thyroid not enlarged, symmetric, no tenderness/mass/nodules Lymph nodes: Cervical, supraclavicular, and axillary nodes normal. Resp: clear to auscultation bilaterally Back: symmetric, no curvature. ROM normal.  No CVA tenderness. Cardio: regular rate and rhythm, S1, S2 normal, no murmur, click, rub or gallop GI: soft, non-tender; bowel sounds normal; no masses,  no organomegaly Extremities: extremities normal, atraumatic, no cyanosis or edema and Homans sign is negative, no sign of DVT Neurologic: Grossly normal  Diagnostic Studies & Laboratory data:     Recent Radiology Findings:   Dg Chest 2 View  Result Date: 12/27/2017 CLINICAL DATA:  Preoperative examination prior to left lower lobectomy for lung mass. Nonsmoker. EXAM: CHEST - 2 VIEW COMPARISON:  Chest CT scan of Nov 15, 2017 and chest x-ray of the same day FINDINGS: The lungs are adequately inflated. There is persistent increased density in the left retrocardiac region. There is mild blunting of the posterior costophrenic angle. The right lung is clear. The heart and pulmonary vascularity are normal. The mediastinum is normal in width. The bony thorax is unremarkable. IMPRESSION: Persistent known mass in the left lower lobe. Otherwise no acute cardiopulmonary abnormality. Electronically Signed   By: David  Martinique M.D.   On: 12/27/2017 11:00   Nm Pet Image Initial (pi) Skull Base To Thigh  Result Date: 12/22/2017 CLINICAL DATA:  Initial treatment strategy for lung mass. EXAM: NUCLEAR MEDICINE PET SKULL BASE TO THIGH TECHNIQUE: 10.9 mCi F-18 FDG was injected intravenously. Full-ring PET imaging was performed from the skull base to thigh after the radiotracer. CT data was obtained and used for attenuation correction and anatomic localization. Fasting blood glucose: 85 mg/dl COMPARISON:  None FINDINGS: Mediastinal blood pool activity: SUV max 2.7 NECK: No hypermetabolic lymph nodes in the neck. Incidental CT findings: none CHEST: The left lower lobe perihilar lung lesion containing areas of calcification are identified. This measures 3.8 by 3.1 cm and has an SUV max of 5.7. Postobstructive consolidation within the left lower lobe with atelectasis and  volume loss is identified. Only mild FDG uptake is associated with the consolidated left lower lobe with an SUV max of 1.59. Small left effusion identified. No hypermetabolic mediastinal lymph nodes. No hypermetabolic right hilar nodes. Intense radiotracer uptake is identified within both breasts, left greater than right. The SUV max in the left breast is equal to 6.5, this is relatively nonspecific in the early partum time frame and may reflect physiologic activity. Incidental CT findings: none ABDOMEN/PELVIS: No abnormal hypermetabolic activity within the liver, pancreas, adrenal glands, or spleen. No hypermetabolic lymph nodes in the abdomen or pelvis. Incidental CT findings: none SKELETON: There is heterogeneous radiotracer activity identified throughout the visualized axial and appendicular skeleton. No definite evidence for hypermetabolic bone metastases. Incidental CT findings: none IMPRESSION: 1. Moderate FDG uptake associated with the partially calcified lesion involving the perihilar left lower. 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 Correlation with tissue sampling advise. 2. Evidence of postobstructive atelectasis/pneumonitis involving the left lower lobe with small left effusion. 3. No evidence for mediastinal nodal metastasis or distant metastatic disease. 4. Intense uptake within the breast tissue is noted bilaterally. In the  early postpartum time frame this may reflect physiologic activity. Consider correlation with mammographic studies. Electronically Signed   By: Kerby Moors M.D.   On: 12/22/2017 13:17  I have independently reviewed the above radiology studies  and reviewed the findings with the patient.    CLINICAL DATA:  Hemoptysis.  Pneumonia.  Thirty-five weeks pregnant.  EXAM: CT ANGIOGRAPHY CHEST WITH CONTRAST  TECHNIQUE: Multidetector CT imaging of the chest was performed using  the standard protocol during bolus administration of intravenous contrast. Multiplanar CT image reconstructions and MIPs were obtained to evaluate the vascular anatomy.  CONTRAST:  44m ISOVUE-370 IOPAMIDOL (ISOVUE-370) INJECTION 76%  COMPARISON:  None.  FINDINGS: Cardiovascular: There is no filling defect in the pulmonary arterial tree to suggest acute pulmonary thromboembolism. No evidence of aortic aneurysm. Great vessels are grossly patent.  Mediastinum/Nodes: No abnormal mediastinal adenopathy. Esophagus is unremarkable. No pericardial effusion.  Lungs/Pleura: There is a large left lower lobe mass extending from the left hilum to the left lung base measuring 3.3 x 4.7 x 3.3 cm worrisome for malignancy. It is lobulated and somewhat low density. There is a polypoid element projecting into the left lower lobe lobar airway. There is also occlusion of the left lower lobe bronchus.  No pneumothorax.  No pleural effusion.  Upper Abdomen: The mass extends through the diaphragm and appears to invade the spleen.  Musculoskeletal: No vertebral compression deformity.  Review of the MIP images confirms the above findings.  IMPRESSION: Large left lower lobe lung mass extending through the diaphragm in and invading the spleen associated with occlusion of the left lower lobe bronchus. Findings are worrisome for malignancy. PET-CT is recommended.  No evidence of pulmonary thromboembolism   Electronically Signed   By: AMarybelle KillingsM.D.   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    I reviewed with the patient and her husband the CT, PET  and bronchoscopy findings.  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  I have recommended that we proceed with left lower lobectomy, with possible sleeve resection.  The patient is agreeable she and her husband's questions have been answered in detail.   The goals risks and alternatives of the planned surgical procedure bronchoscopy left video-assisted thoracoscopy left lower lobe lung resection possible sleeve resection have been discussed with the patient in detail. The risks of the procedure including death, infection, stroke, myocardial infarction, bleeding, blood transfusion, persistent air leak have all been discussed specifically.  I have quoted CGeraldine Contrasa 2 % of perioperative mortality and a complication rate as high as 30%. The patient's questions have been answered.Patricia TILLISONis willing  to proceed with the planned procedure tomorrow.   EGrace IsaacMD      [address] [place] [address]Office [phone]  Beeper [phone]  12/29/2017 7:25 AM

## 2017-12-29 NOTE — Anesthesia Procedure Notes (Signed)
Central Venous Catheter Insertion Performed by: Duane Boston, MD, anesthesiologist Start/End7/05/2018 7:14 AM, 12/29/2017 7:24 AM Patient location: Pre-op. Preanesthetic checklist: patient identified, IV checked, site marked, risks and benefits discussed, surgical consent, monitors and equipment checked, pre-op evaluation, timeout performed and anesthesia consent Position: Trendelenburg Lidocaine 1% used for infiltration and patient sedated Hand hygiene performed , maximum sterile barriers used  and Seldinger technique used Catheter size: 8 Fr Total catheter length 16. Central line was placed.Double lumen Procedure performed using ultrasound guided technique. Ultrasound Notes:image(s) printed for medical record Attempts: 1 Following insertion, dressing applied, line sutured and Biopatch. Post procedure assessment: blood return through all ports, free fluid flow and no air  Patient tolerated the procedure well with no immediate complications.

## 2017-12-29 NOTE — Transfer of Care (Signed)
Immediate Anesthesia Transfer of Care Note  Patient: Patricia Glenn  Procedure(s) Performed: VIDEO BRONCHOSCOPY (N/A Chest) Left VIDEO ASSISTED THORACOSCOPY with Mini Thoracotomy, Left Lower Lobe Lobectomy, Node Dissection,  Insertion of  ONQ pain pump (Left Chest)  Patient Location: PACU  Anesthesia Type:General  Level of Consciousness: sedated  Airway & Oxygen Therapy: Patient Spontanous Breathing and Patient connected to face mask oxygen  Post-op Assessment: Report given to RN, Post -op Vital signs reviewed and stable and Patient moving all extremities X 4  Post vital signs: Reviewed and stable  Last Vitals:  Vitals Value Taken Time  BP    Temp    Pulse 103 12/29/2017  3:37 PM  Resp 19 12/29/2017  3:37 PM  SpO2 99 % 12/29/2017  3:37 PM  Vitals shown include unvalidated device data.  Last Pain:  Vitals:   12/29/17 0549  TempSrc:   PainSc: 0-No pain         Complications: No apparent anesthesia complications

## 2017-12-29 NOTE — Brief Op Note (Addendum)
      [address]       [place] [address]             [phone]     12/29/2017  3:33 PM  PATIENT:  Patricia Glenn  32 y.o. female  PRE-OPERATIVE DIAGNOSIS:  LLL TUMOR  POST-OPERATIVE DIAGNOSIS:  LLL TUMOR carcinoid   PROCEDURE:  VIDEO BRONCHOSCOPY,  LEFT VIDEO ASSISTED THORACOSCOPY LEFT  Mini THORACOTOMY, LEFT LOWER LOBECTOMY, LYMPH NODE DISSECTION,    ON Q pain pump   SURGEON:  Surgeon(s) and Role:    Grace Isaac, MD - Primary  ANESTHESIA:   general  EBL:  350 mL   DRAINS: Chest tubes placed in the mediastinal and pleural spaces   SPECIMEN:  Source of Specimen:  Left lower lobe, mulitple lymph nodes  DISPOSITION OF SPECIMEN:  results pending but margin clear of tumor  COUNTS CORRECT:  YES  DICTATION: .Dragon Dictation  PLAN OF CARE: Admit to inpatient   PATIENT DISPOSITION:  ICU - intubated and hemodynamically stable.   Delay start of Pharmacological VTE agent (>24hrs) due to surgical blood loss or risk of bleeding: yes              .  .  .  .  .  .  Marland Kitchen

## 2017-12-29 NOTE — Progress Notes (Signed)
Patient ID: Patricia Glenn, female   DOB: July 23, 1985, 32 y.o.   MRN: [id number] EVENING ROUNDS NOTE :     [address]       [place] [address]             [phone]                 Day of Surgery Procedure(s) (LRB): VIDEO BRONCHOSCOPY (N/A) Left VIDEO ASSISTED THORACOSCOPY with Mini Thoracotomy, Left Lower Lobe Lobectomy, Node Dissection,  Insertion of  ONQ pain pump (Left)  Total Length of Stay:  LOS: 0 days  BP (!) 144/99   Pulse 83   Temp (!) 97.3 F (36.3 C)   Resp 20   SpO2 100%   .Intake/Output      07/12 0701 - 07/13 0700   I.V. 3523   Other 125   IV Piggyback 75   Total Intake 3723   Urine 1595   Blood 350   Chest Tube 120   Total Output 2065   Net +1658         . bupivacaine 0.5 % ON-Q pump SINGLE CATH 400 mL    .  ceFAZolin (ANCEF) IV    . dextrose 5 % and 0.9 % NaCl with KCl 20 mEq/L 100 mL/hr at 12/29/17 1815  . potassium chloride       Lab Results  Component Value Date   WBC 6.5 12/27/2017   HGB 12.6 12/29/2017   HCT 37.0 12/29/2017   PLT 203 12/27/2017   GLUCOSE 110 (H) 12/27/2017   ALT 25 12/27/2017   AST 21 12/27/2017   NA 138 12/29/2017   K 3.8 12/29/2017   CL 107 12/27/2017   CREATININE 0.85 12/27/2017   BUN 10 12/27/2017   CO2 21 (L) 12/27/2017   INR 0.96 12/27/2017   Sitting on side of bed Low chest output No air leak   Grace Isaac MD  Beeper [phone] Office [phone] 12/29/2017 7:33 PM

## 2017-12-30 ENCOUNTER — Inpatient Hospital Stay (HOSPITAL_COMMUNITY): Payer: Managed Care, Other (non HMO)

## 2017-12-30 LAB — BASIC METABOLIC PANEL
ANION GAP: 7 (ref 5–15)
BUN: 11 mg/dL (ref 6–20)
CO2: 27 mmol/L (ref 22–32)
Calcium: 8.6 mg/dL — ABNORMAL LOW (ref 8.9–10.3)
Chloride: 104 mmol/L (ref 98–111)
Creatinine, Ser: 0.84 mg/dL (ref 0.44–1.00)
GFR calc Af Amer: >60 mL/min (ref >60)
GFR calc non Af Amer: >60 mL/min (ref >60)
GLUCOSE: 128 mg/dL — AB (ref 70–99)
Potassium: 4.5 mmol/L (ref 3.5–5.1)
Sodium: 138 mmol/L (ref 135–145)

## 2017-12-30 LAB — GLUCOSE, CAPILLARY
Glucose-Capillary: 104 mg/dL — ABNORMAL HIGH (ref 70–99)
Glucose-Capillary: 111 mg/dL — ABNORMAL HIGH (ref 70–99)
Glucose-Capillary: 133 mg/dL — ABNORMAL HIGH (ref 70–99)
Glucose-Capillary: 141 mg/dL — ABNORMAL HIGH (ref 70–99)

## 2017-12-30 LAB — CBC
HEMATOCRIT: 36.5 % (ref 36.0–46.0)
Hemoglobin: 11.9 g/dL — ABNORMAL LOW (ref 12.0–15.0)
MCH: 28.2 pg (ref 26.0–34.0)
MCHC: 32.6 g/dL (ref 30.0–36.0)
MCV: 86.5 fL (ref 78.0–100.0)
Platelets: 250 10*3/uL (ref 150–400)
RBC: 4.22 MIL/uL (ref 3.87–5.11)
RDW: 12.5 % (ref 11.5–15.5)
WBC: 10.2 10*3/uL (ref 4.0–10.5)

## 2017-12-30 MED ORDER — CHLORHEXIDINE GLUCONATE CLOTH 2 % EX PADS
6.0000 | MEDICATED_PAD | Freq: Every day | CUTANEOUS | Status: DC
  Administered 2017-12-30 – 2018-01-05 (×6): 6 via TOPICAL

## 2017-12-30 MED ORDER — SODIUM CHLORIDE 0.9% FLUSH: 10.0000 mL | INTRAVENOUS | Status: DC | PRN

## 2017-12-30 MED ORDER — SODIUM CHLORIDE 0.9% FLUSH
10.0000 mL | Freq: Two times a day (BID) | INTRAVENOUS | Status: DC
  Administered 2017-12-30 – 2018-01-05 (×10): 10 mL

## 2017-12-30 MED ORDER — ENOXAPARIN SODIUM 40 MG/0.4ML ~~LOC~~ SOLN
40.0000 mg | SUBCUTANEOUS | Status: DC
  Administered 2017-12-30 – 2018-01-06 (×8): 40 mg via SUBCUTANEOUS
  Filled 2017-12-30 (×8): qty 0.4

## 2017-12-30 NOTE — Progress Notes (Signed)
Patient ID: Patricia Glenn, female   DOB: 07-15-1985, 32 y.o.   MRN: [id number] EVENING ROUNDS NOTE :     [address]       [place] [address]             [phone]                 1 Day Post-Op Procedure(s) (LRB): VIDEO BRONCHOSCOPY (N/A) Left VIDEO ASSISTED THORACOSCOPY with Mini Thoracotomy, Left Lower Lobe Lobectomy, Node Dissection,  Insertion of  ONQ pain pump (Left)  Total Length of Stay:  LOS: 1 day  BP 128/76   Pulse 91   Temp 98.4 F (36.9 C) (Oral)   Resp 17   Ht 5\' 7"  (1.702 m)   Wt 213 lb 3 oz (96.7 kg)   SpO2 99%   BMI 33.39 kg/m   .Intake/Output      07/13 0701 - 07/14 0700   P.O. 480   I.V. (mL/kg) 338 (3.5)   Other    IV Piggyback    Total Intake(mL/kg) 818 (8.5)   Urine (mL/kg/hr) 875 (0.7)   Blood    Chest Tube 150   Total Output 1025   Net -207.1         . bupivacaine 0.5 % ON-Q pump SINGLE CATH 400 mL    . dextrose 5 % and 0.9 % NaCl with KCl 20 mEq/L 10 mL/hr at 12/30/17 0905  . potassium chloride       Lab Results  Component Value Date   WBC 10.2 12/30/2017   HGB 11.9 (L) 12/30/2017   HCT 36.5 12/30/2017   PLT 250 12/30/2017   GLUCOSE 128 (H) 12/30/2017   ALT 25 12/27/2017   AST 21 12/27/2017   NA 138 12/30/2017   K 4.5 12/30/2017   CL 104 12/30/2017   CREATININE 0.84 12/30/2017   BUN 11 12/30/2017   CO2 27 12/30/2017   INR 0.96 12/27/2017    Stable day  Grace Isaac MD  Beeper [phone] Office [phone] 12/30/2017 7:57 PM

## 2017-12-30 NOTE — Progress Notes (Signed)
Patient ID: Patricia Glenn, female   DOB: May 13, 1986, 32 y.o.   MRN: [id number] TCTS DAILY ICU PROGRESS NOTE                   [address]            [place] [address]          [phone]   1 Day Post-Op Procedure(s) (LRB): VIDEO BRONCHOSCOPY (N/A) Left VIDEO ASSISTED THORACOSCOPY with Mini Thoracotomy, Left Lower Lobe Lobectomy, Node Dissection,  Insertion of  ONQ pain pump (Left)  Total Length of Stay:  LOS: 1 day   Subjective: Patient up in chair, with good pain control, no air leak  Objective: Vital signs in last 24 hours: Temp:  [97.3 F (36.3 C)-98.4 F (36.9 C)] 97.8 F (36.6 C) (07/13 0353) Pulse Rate:  [63-103] 78 (07/13 0700) Cardiac Rhythm: Normal sinus rhythm (07/13 0400) Resp:  [9-25] 20 (07/13 0700) BP: (118-155)/(67-99) 137/83 (07/13 0600) SpO2:  [98 %-100 %] 99 % (07/13 0700) Arterial Line BP: (163-185)/(85-97) 185/97 (07/12 1900) Weight:  [213 lb 3 oz (96.7 kg)] 213 lb 3 oz (96.7 kg) (07/13 0600)  Filed Weights   12/30/17 0600  Weight: 213 lb 3 oz (96.7 kg)    Weight change:    Hemodynamic parameters for last 24 hours:    Intake/Output from previous day: 07/12 0701 - 07/13 0700 In: 5095.6 [I.V.:4695.8; IV Piggyback:274.9] Out: 9024 [Urine:3075; Blood:350; Chest Tube:260]  Intake/Output this shift: No intake/output data recorded.  Current Meds: Scheduled Meds: . acetaminophen  1,000 mg Oral Q6H   Or  . acetaminophen (TYLENOL) oral liquid 160 mg/5 mL  1,000 mg Oral Q6H  . bisacodyl  10 mg Oral Daily  . Chlorhexidine Gluconate Cloth  6 each Topical Daily  . fentaNYL   Intravenous Q4H  . insulin aspart  0-24 Units Subcutaneous Q6H  . mouth rinse  15 mL Mouth Rinse BID  . prenatal multivitamin  1 tablet Oral Daily  . senna-docusate  1 tablet Oral QHS  . sodium chloride flush  10-40 mL Intracatheter Q12H   Continuous Infusions: . bupivacaine 0.5 % ON-Q pump SINGLE CATH 400 mL    . dextrose 5 % and 0.9 % NaCl with KCl 20  mEq/L 100 mL/hr at 12/30/17 0700  . potassium chloride     PRN Meds:.diphenhydrAMINE **OR** diphenhydrAMINE, naloxone **AND** sodium chloride flush, ondansetron (ZOFRAN) IV, potassium chloride, sodium chloride flush, traMADol  General appearance: alert and cooperative Neurologic: intact Heart: regular rate and rhythm, S1, S2 normal, no murmur, click, rub or gallop Lungs: diminished breath sounds bibasilar Abdomen: soft, non-tender; bowel sounds normal; no masses,  no organomegaly Extremities: extremities normal, atraumatic, no cyanosis or edema and Homans sign is negative, no sign of DVT Wound: Chest tubes intact no air leak, good fluctuation of fluid:  Lab Results: CBC: Recent Labs    12/27/17 0909  12/29/17 1305 12/30/17 0413  WBC 6.5  --   --  10.2  HGB 13.4   < > 12.6 11.9*  HCT 42.3   < > 37.0 36.5  PLT 203  --   --  250   < > = values in this interval not displayed.   BMET:  Recent Labs    12/27/17 0909  12/29/17 1305 12/30/17 0413  NA 138   < > 138 138  K 3.4*   < > 3.8 4.5  CL 107  --   --  104  CO2 21*  --   --  27  GLUCOSE 110*  --   --  128*  BUN 10  --   --  11  CREATININE 0.85  --   --  0.84  CALCIUM 8.9  --   --  8.6*   < > = values in this interval not displayed.    CMET: Lab Results  Component Value Date   WBC 10.2 12/30/2017   HGB 11.9 (L) 12/30/2017   HCT 36.5 12/30/2017   PLT 250 12/30/2017   GLUCOSE 128 (H) 12/30/2017   ALT 25 12/27/2017   AST 21 12/27/2017   NA 138 12/30/2017   K 4.5 12/30/2017   CL 104 12/30/2017   CREATININE 0.84 12/30/2017   BUN 11 12/30/2017   CO2 27 12/30/2017   INR 0.96 12/27/2017      PT/INR:  Recent Labs    12/27/17 0909  LABPROT 12.7  INR 0.96   Radiology: Dg Chest Port 1 View  Result Date: 12/30/2017 CLINICAL DATA:  Pneumothorax. Postop resection of left lower lobe mass. EXAM: PORTABLE CHEST 1 VIEW COMPARISON:  12/29/2017 FINDINGS: Two chest tubes on the left remain in place without pneumothorax.  Central line in the SVC unchanged Bibasilar airspace disease left greater than right unchanged. No edema or effusion IMPRESSION: No active disease. Electronically Signed   By: Franchot Gallo M.D.   On: 12/30/2017 07:21   Dg Chest Port 1 View  Result Date: 12/29/2017 CLINICAL DATA:  32 year old female status post left lower lobectomy EXAM: PORTABLE CHEST 1 VIEW COMPARISON:  CT 11/15/2017, chest x-ray 12/27/2017 FINDINGS: Cardiomediastinal silhouette likely unchanged, accentuated by low lung volumes. Interval placement of right IJ central venous catheter appearing to terminate superior vena cava. Interval placement of 2 left-sided thoracostomy tubes. No visualized pneumothorax. Ill-defined opacity in the retrocardiac region. IMPRESSION: Low lung volumes with left basilar atelectasis/surgical changes, status post left lower lobectomy. No evidence of left pneumothorax with to thoracostomy tubes in place. Right IJ central catheter, with no complicating features. Electronically Signed   By: Corrie Mckusick D.O.   On: 12/29/2017 16:04     Assessment/Plan: S/P Procedure(s) (LRB): VIDEO BRONCHOSCOPY (N/A) Left VIDEO ASSISTED THORACOSCOPY with Mini Thoracotomy, Left Lower Lobe Lobectomy, Node Dissection,  Insertion of  ONQ pain pump (Left) Mobilize Chest tube to waterseal A-line out last night and Foley out this morning Work on pulmonary toilet   Grace Isaac 12/30/2017 8:13 AM

## 2017-12-31 ENCOUNTER — Inpatient Hospital Stay (HOSPITAL_COMMUNITY): Payer: Managed Care, Other (non HMO)

## 2017-12-31 LAB — CBC
HCT: 33.5 % — ABNORMAL LOW (ref 36.0–46.0)
Hemoglobin: 10.3 g/dL — ABNORMAL LOW (ref 12.0–15.0)
MCH: 27.3 pg (ref 26.0–34.0)
MCHC: 30.7 g/dL (ref 30.0–36.0)
MCV: 88.9 fL (ref 78.0–100.0)
Platelets: 186 10*3/uL (ref 150–400)
RBC: 3.77 MIL/uL — ABNORMAL LOW (ref 3.87–5.11)
RDW: 13 % (ref 11.5–15.5)
WBC: 7.3 10*3/uL (ref 4.0–10.5)

## 2017-12-31 LAB — COMPREHENSIVE METABOLIC PANEL
ALBUMIN: 2.8 g/dL — AB (ref 3.5–5.0)
ALK PHOS: 51 U/L (ref 38–126)
ALT: 22 U/L (ref 0–44)
ANION GAP: 4 — AB (ref 5–15)
AST: 32 U/L (ref 15–41)
BUN: 12 mg/dL (ref 6–20)
CO2: 29 mmol/L (ref 22–32)
Calcium: 8.1 mg/dL — ABNORMAL LOW (ref 8.9–10.3)
Chloride: 106 mmol/L (ref 98–111)
Creatinine, Ser: 0.86 mg/dL (ref 0.44–1.00)
GFR calc Af Amer: >60 mL/min (ref >60)
GFR calc non Af Amer: >60 mL/min (ref >60)
Glucose, Bld: 108 mg/dL — ABNORMAL HIGH (ref 70–99)
POTASSIUM: 4 mmol/L (ref 3.5–5.1)
SODIUM: 139 mmol/L (ref 135–145)
Total Bilirubin: 0.2 mg/dL — ABNORMAL LOW (ref 0.3–1.2)
Total Protein: 5.3 g/dL — ABNORMAL LOW (ref 6.5–8.1)

## 2017-12-31 LAB — GLUCOSE, CAPILLARY
Glucose-Capillary: 101 mg/dL — ABNORMAL HIGH (ref 70–99)
Glucose-Capillary: 101 mg/dL — ABNORMAL HIGH (ref 70–99)
Glucose-Capillary: 88 mg/dL (ref 70–99)

## 2017-12-31 MED ORDER — FENTANYL 40 MCG/ML IV SOLN
INTRAVENOUS | Status: AC
  Filled 2017-12-31: qty 25

## 2017-12-31 NOTE — Progress Notes (Signed)
4cc Fentanyl wasted with Rachel Moulds, RN

## 2017-12-31 NOTE — Anesthesia Postprocedure Evaluation (Signed)
Anesthesia Post Note  Patient: Patricia Glenn  Procedure(s) Performed: VIDEO BRONCHOSCOPY (N/A Chest) Left VIDEO ASSISTED THORACOSCOPY with Mini Thoracotomy, Left Lower Lobe Lobectomy, Node Dissection,  Insertion of  ONQ pain pump (Left Chest)     Patient location during evaluation: PACU Anesthesia Type: General Level of consciousness: sedated Pain management: pain level controlled Vital Signs Assessment: post-procedure vital signs reviewed and stable Respiratory status: spontaneous breathing and respiratory function stable Cardiovascular status: stable Postop Assessment: no apparent nausea or vomiting Anesthetic complications: no                  Lenetta Piche DANIEL

## 2018-01-01 ENCOUNTER — Encounter (HOSPITAL_COMMUNITY): Payer: Self-pay | Admitting: Cardiothoracic Surgery

## 2018-01-01 ENCOUNTER — Inpatient Hospital Stay (HOSPITAL_COMMUNITY): Payer: Managed Care, Other (non HMO)

## 2018-01-01 LAB — BASIC METABOLIC PANEL
Anion gap: 8 (ref 5–15)
BUN: 9 mg/dL (ref 6–20)
CO2: 29 mmol/L (ref 22–32)
Calcium: 8.5 mg/dL — ABNORMAL LOW (ref 8.9–10.3)
Chloride: 104 mmol/L (ref 98–111)
Creatinine, Ser: 0.77 mg/dL (ref 0.44–1.00)
GFR calc Af Amer: >60 mL/min (ref >60)
GFR calc non Af Amer: >60 mL/min (ref >60)
Glucose, Bld: 92 mg/dL (ref 70–99)
Potassium: 3.8 mmol/L (ref 3.5–5.1)
Sodium: 141 mmol/L (ref 135–145)

## 2018-01-01 LAB — CBC
HCT: 33.7 % — ABNORMAL LOW (ref 36.0–46.0)
Hemoglobin: 10.7 g/dL — ABNORMAL LOW (ref 12.0–15.0)
MCH: 27.4 pg (ref 26.0–34.0)
MCHC: 31.8 g/dL (ref 30.0–36.0)
MCV: 86.4 fL (ref 78.0–100.0)
Platelets: 205 10*3/uL (ref 150–400)
RBC: 3.9 MIL/uL (ref 3.87–5.11)
RDW: 12.5 % (ref 11.5–15.5)
WBC: 6.1 10*3/uL (ref 4.0–10.5)

## 2018-01-01 LAB — GLUCOSE, CAPILLARY
Glucose-Capillary: 123 mg/dL — ABNORMAL HIGH (ref 70–99)
Glucose-Capillary: 82 mg/dL (ref 70–99)
Glucose-Capillary: 94 mg/dL (ref 70–99)
Glucose-Capillary: 98 mg/dL (ref 70–99)
Glucose-Capillary: 99 mg/dL (ref 70–99)
Glucose-Capillary: 99 mg/dL (ref 70–99)

## 2018-01-01 NOTE — Discharge Summary (Signed)
Physician Discharge Summary       [address]       [place] [address]             [phone]    Patient ID: Patricia Glenn MRN: [id number] DOB/AGE: 32/06/1985 32 y.o.  Admit date: 12/29/2017 Discharge date: 01/06/2018  Admission Diagnoses: Left lower lobe mass  Discharge Diagnoses:  1. S/p bronchoscopy, left VATS, mini thoracotomy, LLL, lymph node dissection, and On Q pump 2. History of LLL pneumonia  Procedure (s):  VIDEO BRONCHOSCOPY,  LEFT VIDEO ASSISTED THORACOSCOPY, LEFT MINI THORACOTOMY, LEFT LOWER LOBECTOMY, LYMPH NODE DISSECTION,  and ON Q pain pump by Dr. Servando Snare on 12/29/2017.   Final Pathology:  FINAL DIAGNOSIS Diagnosis 1. Lymph node, biopsy, Left 8 - THERE IS NO EVIDENCE OF MALIGNANCY IN 1 OF 1 LYMPH NODE (0/1). 2. Lymph node, biopsy, Left 8 #2 - THERE IS NO EVIDENCE OF MALIGNANCY IN 1 OF 1 LYMPH NODE (0/1). 3. 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 4. Lung, resection (segmental or lobe), LLL - CARCINOID TUMOR, 4.5 CM. - PERINEURAL INVASION IS IDENTIFIED. - LYMPHOVASCULAR INVASION IS IDENTIFIED. - THE SURGICAL RESECTION MARGINS ARE NEGATIVE FOR TUMOR. - SEE ONCOLOGY TABLE BELOW. 5. 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 6. 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 7. Lymph node, biopsy, #3, left - THERE IS NO EVIDENCE OF MALIGNANCY IN 1 OF 1 LYMPH NODE (0/1). 8. Lymph node, biopsy, 10 L - THERE IS NO EVIDENCE OF MALIGNANCY IN 1 OF 1 LYMPH NODE (0/1). 9. Lymph node, biopsy, 11 L - BENIGN FIBROADIPOSE TISSUE. - LYMPH NODAL TISSUE IS NOT IDENTIFIED. - THERE IS NO EVIDENCE OF MALIGNANCY. Stage Classification (pTNM, AJCC 8th Edition): pT2a, pN1.  History of Presenting Illness: Patricia Glenn 32 y.o. female is seen in the office  today for further evaluation and consideration  of resection  of left lung mass.  In March 2018 the patient was treated for a case of left lower lobe pneumonia.  Symptoms cleared at that time but in the winter 2019 she had further episodes of left lower lobe pneumonia.  About a month ago she had an episode of hemoptysis, leading to a CT scan.  Bronchoscopy was performed by Dr. Jamal Collin a smooth bordered left lower lobe lung mass just at the level of the superior segment was noted.  During this time she was pregnant with her third child.  She delivered her baby 3 to 4 weeks early, her original due date was today.  She has had no further hemoptysis  Patient has never been a smoker  Since last seen PET scan has been done and formal pulmonary function studies.  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    I reviewed with the patient and her husband the CT, PET  and bronchoscopy findings.  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  Dr. Servando Snare recommended that we proceed with left lower lobectomy, with possible sleeve resection.  The patient is agreeable she and her husband's questions have been answered in detail. Patient was admitted on 12/29/2017 in order to undergo bronchoscopy, left VATS, mini thoracotomy, LLL, lymph node dissection, and On Q pump.  Brief Hospital Course:  The patient remained afebrile and hemodynamically stable. A line and foley were removed early in the post operative course. Chest tube output gradually decreased. Chest tube was placed to water seal on 07/14.  Daily chest x rays were obtained and remained stable. Chest tube did have a small, intermittent air leak so chest tube remained for a few more days. The chest tube was placed back to suction on 07/17 because of the small, persistent air leak. On 07/18, there was no air leak and chest x ray was stable. Chest tube was  again placed to water seal. Chest tube was removed on 07/19. Follow up chest x ray showed questionable minimal apical pneumothorax.  Patient is ambulating on room air. Patient is tolerating a diet and has had a bowel movement. Wounds are clean and dry.  Patient is felt surgically stable for discharge today.  Latest Vital Signs: Blood pressure 124/88, pulse 93, temperature 98.1 F (36.7 C), temperature source Oral, resp. rate 19, height 5\' 7"  (1.702 m), weight 213 lb 3 oz (96.7 kg), SpO2 100 %, unknown if currently breastfeeding.  Physical Exam: Cardiovascular: RRR Pulmonary: Mostly clear bilaterally Abdomen: Soft, non tender, bowel sounds present. Extremities: No lower extremity edema. Wounds: Clean and dry.  No erythema or signs of infection.  Discharge Condition: Stable and discharged to home.  Recent laboratory studies:  Lab Results  Component Value Date   WBC 6.1 01/01/2018   HGB 10.7 (L) 01/01/2018   HCT 33.7 (L) 01/01/2018   MCV 86.4 01/01/2018   PLT 205 01/01/2018   Lab Results  Component Value Date   NA 141 01/01/2018   K 3.8 01/01/2018   CL 104 01/01/2018   CO2 29 01/01/2018   CREATININE 0.77 01/01/2018   GLUCOSE 92 01/01/2018      Diagnostic Studies: Dg Chest 2 View  Result Date: 01/06/2018 CLINICAL DATA:  Status post left lower lobectomy. EXAM: CHEST - 2 VIEW COMPARISON:  01/05/2018 FINDINGS: The cardiomediastinal silhouette is unchanged with the heart size mildly accentuated by low lung volumes. A small left pleural effusion may be slightly larger. Minimal left basilar atelectasis is noted. The right lung is clear. A tiny left apical pneumothorax is questioned. IMPRESSION: 1. Possible tiny left apical pneumothorax. 2. Small left pleural effusion. Electronically Signed   By: Logan Bores M.D.   On: 01/06/2018 09:36   Dg Chest 2 View  Result Date: 01/05/2018 CLINICAL DATA:  Chest tube removal EXAM: CHEST - 2 VIEW COMPARISON:  01/05/2018 FINDINGS: Grossly unchanged  cardiac silhouette and mediastinal contours. Interval removal of left-sided chest tube.  No pneumothorax. Improved aeration of the left lung base with persistent left basilar opacities, likely atelectasis. There is minimal blunting of the left costophrenic angle. No definite pleural effusion. No evidence of edema. No acute osseus abnormalities. IMPRESSION: Uncomplicated removal of left-sided chest tube. Specifically, no pneumothorax. Electronically Signed   By: Sandi Mariscal M.D.   On: 01/05/2018 12:51   Dg Chest 2 View  Result Date: 01/04/2018 CLINICAL DATA:  Pneumothorax, chest tube, left lower lobectomy EXAM: CHEST - 2 VIEW COMPARISON:  Chest radiograph from one day prior. FINDINGS: Left chest tube is stable with the tip in the medial upper left pleural space. Stable cardiomediastinal silhouette with normal heart size. No convincing pneumothorax. No pleural effusion. Stable curvilinear opacities at the left lung base with volume loss in the left hemithorax. No pulmonary edema. No acute consolidative airspace disease. IMPRESSION: 1. Stable left chest tube with no  convincing pneumothorax. 2. Stable curvilinear opacities at the left lung base, favor scarring or atelectasis. 3. Stable volume loss in the left hemithorax from left lower lobectomy. Electronically Signed   By: Ilona Sorrel M.D.   On: 01/04/2018 10:51   Dg Chest 2 View  Result Date: 01/03/2018 CLINICAL DATA:  Chest tube EXAM: CHEST - 2 VIEW COMPARISON:  01/02/2018 FINDINGS: Left-sided chest tube in satisfactory position. Possible tiny loculated left basilar pneumothorax. Right lung is clear. No pleural effusion. Stable cardiomediastinal silhouette. No acute osseous abnormality. IMPRESSION: 1. Left-sided chest tube in satisfactory position. Possible tiny loculated left basilar pneumothorax. Electronically Signed   By: Kathreen Devoid   On: 01/03/2018 10:53   Dg Chest 2 View  Result Date: 01/02/2018 CLINICAL DATA:  Followup pneumothorax. EXAM: CHEST  - 2 VIEW COMPARISON:  01/01/2018 FINDINGS: The cardiac silhouette, mediastinal and hilar contours are stable. The left-sided chest tube is stable. No left-sided pneumothorax is identified. Streaky areas of atelectasis bilaterally. No definite pleural effusion. IMPRESSION: Left-sided chest tube in stable position.  No residual pneumothorax. Streaky areas of bilateral atelectasis. Electronically Signed   By: Marijo Sanes M.D.   On: 01/02/2018 08:09   Dg Chest 2 View  Result Date: 12/27/2017 CLINICAL DATA:  Preoperative examination prior to left lower lobectomy for lung mass. Nonsmoker. EXAM: CHEST - 2 VIEW COMPARISON:  Chest CT scan of Nov 15, 2017 and chest x-ray of the same day FINDINGS: The lungs are adequately inflated. There is persistent increased density in the left retrocardiac region. There is mild blunting of the posterior costophrenic angle. The right lung is clear. The heart and pulmonary vascularity are normal. The mediastinum is normal in width. The bony thorax is unremarkable. IMPRESSION: Persistent known mass in the left lower lobe. Otherwise no acute cardiopulmonary abnormality. Electronically Signed   By: David  Martinique M.D.   On: 12/27/2017 11:00   Nm Pet Image Initial (pi) Skull Base To Thigh  Result Date: 12/22/2017 CLINICAL DATA:  Initial treatment strategy for lung mass. EXAM: NUCLEAR MEDICINE PET SKULL BASE TO THIGH TECHNIQUE: 10.9 mCi F-18 FDG was injected intravenously. Full-ring PET imaging was performed from the skull base to thigh after the radiotracer. CT data was obtained and used for attenuation correction and anatomic localization. Fasting blood glucose: 85 mg/dl COMPARISON:  None FINDINGS: Mediastinal blood pool activity: SUV max 2.7 NECK: No hypermetabolic lymph nodes in the neck. Incidental CT findings: none CHEST: The left lower lobe perihilar lung lesion containing areas of calcification are identified. This measures 3.8 by 3.1 cm and has an SUV max of 5.7. Postobstructive  consolidation within the left lower lobe with atelectasis and volume loss is identified. Only mild FDG uptake is associated with the consolidated left lower lobe with an SUV max of 1.59. Small left effusion identified. No hypermetabolic mediastinal lymph nodes. No hypermetabolic right hilar nodes. Intense radiotracer uptake is identified within both breasts, left greater than right. The SUV max in the left breast is equal to 6.5, this is relatively nonspecific in the early partum time frame and may reflect physiologic activity. Incidental CT findings: none ABDOMEN/PELVIS: No abnormal hypermetabolic activity within the liver, pancreas, adrenal glands, or spleen. No hypermetabolic lymph nodes in the abdomen or pelvis. Incidental CT findings: none SKELETON: There is heterogeneous radiotracer activity identified throughout the visualized axial and appendicular skeleton. No definite evidence for hypermetabolic bone metastases. Incidental CT findings: none IMPRESSION: 1. Moderate FDG uptake associated with the partially calcified lesion involving the perihilar left lower.  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 Correlation with tissue sampling advise. 2. Evidence of postobstructive atelectasis/pneumonitis involving the left lower lobe with small left effusion. 3. No evidence for mediastinal nodal metastasis or distant metastatic disease. 4. Intense uptake within the breast tissue is noted bilaterally. In the early postpartum time frame this may reflect physiologic activity. Consider correlation with mammographic studies. Electronically Signed   By: Kerby Moors M.D.   On: 12/22/2017 13:17   Dg Chest Port 1 View  Result Date: 01/05/2018 CLINICAL DATA:  Chest tube, pneumothorax EXAM: PORTABLE CHEST 1 VIEW COMPARISON:  01/04/2018 FINDINGS: Left chest tube remains in place. No visible pneumothorax. Left base atelectasis. Right  lung clear. Heart is normal size. IMPRESSION: Left chest tube remains in place without visible pneumothorax. Left base atelectasis. Electronically Signed   By: Rolm Baptise M.D.   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 EXAM: PORTABLE CHEST 1 VIEW COMPARISON:  Chest x-rays dated 12/31/2017 and 12/30/2017 and chest CT dated 11/15/2017 FINDINGS: One of the 2 left chest tubes has been removed. Very tiny left apical pneumothorax. Central line has been removed. No infiltrates or effusions. Minimal atelectasis at the left lung base. Heart size and vascularity are normal.  No bone abnormality. IMPRESSION: 1. Very tiny left apical pneumothorax after removal of 1 of the 2 left chest tubes. 2. Minimal left base atelectasis. Electronically Signed   By: Lorriane Shire M.D.   On: 01/01/2018 07:56   Dg Chest Port 1 View  Result Date: 12/31/2017 CLINICAL DATA:  Evaluate chest tube placement. EXAM: PORTABLE CHEST 1 VIEW COMPARISON:  12/30/2017. FINDINGS: Two left chest tubes are in place. No pneumothorax. Right IJ catheter tip is at the cavoatrial junction. No pleural effusion or edema identified. No focal pulmonary opacities. IMPRESSION: 1. Left chest tubes in place without pneumothorax. Electronically Signed   By: Kerby Moors M.D.   On: 12/31/2017 08:35   Dg Chest Port 1 View  Result Date: 12/30/2017 CLINICAL DATA:  Pneumothorax. Postop resection of left lower lobe mass. EXAM: PORTABLE CHEST 1 VIEW COMPARISON:  12/29/2017 FINDINGS: Two chest tubes on the left remain in place without pneumothorax. Central line in the SVC unchanged Bibasilar airspace disease left greater than right unchanged. No edema or effusion IMPRESSION: No active disease. Electronically Signed   By: Franchot Gallo M.D.   On: 12/30/2017 07:21   Dg Chest Port 1 View  Result Date: 12/29/2017 CLINICAL DATA:  32 year old female status post left lower lobectomy  EXAM: PORTABLE CHEST 1 VIEW COMPARISON:  CT 11/15/2017, chest x-ray 12/27/2017 FINDINGS: Cardiomediastinal silhouette likely unchanged, accentuated by low lung volumes. Interval placement of right IJ central venous catheter appearing to terminate superior vena cava. Interval placement of 2 left-sided thoracostomy tubes. No visualized pneumothorax. Ill-defined opacity in the retrocardiac region. IMPRESSION: Low lung volumes with left basilar atelectasis/surgical changes, status post left lower lobectomy. No evidence of left pneumothorax with to thoracostomy tubes in place. Right IJ central catheter, with no complicating features. Electronically Signed   By: Corrie Mckusick D.O.   On: 12/29/2017 16:04    Discharge Medications: Allergies as of 01/06/2018   No Known Allergies     Medication List    TAKE these medications   acetaminophen 500 MG tablet Commonly known as:  TYLENOL Take 2 tablets (1,000 mg total) by  mouth every 6 (six) hours as needed for mild pain (pain).   ibuprofen 800 MG tablet Commonly known as:  ADVIL,MOTRIN Take 1 tablet (800 mg total) by mouth every 8 (eight) hours as needed. What changed:    medication strength  how much to take  when to take this  reasons to take this   PRENATAL 28-0.8 MG Tabs Take 1 tablet by mouth daily.       Follow Up Appointments: Follow-up Information    Grace Isaac, MD. Go on 01/22/2018.   Specialty:  Cardiothoracic Surgery Why:  PA/LAT CXR to be taken (at [facility] which is in the same building as Dr. Barb Merino 01/22/2018 at 2:30 pm;Appointment time is at 3:00 pm Contact information: [place] [address] [phone]           Signed: Ellamae Sia 01/06/2018, 10:06 AM

## 2018-01-01 NOTE — Care Management Note (Signed)
Case Management Note  Patient Details  Name: Patricia Glenn MRN: [id number] Date of Birth: Nov 16, 1985  Subjective/Objective:    Pt is s/p VATS for new dx of Lung CA                Action/Plan:   PTA independent from home with husband   Expected Discharge Date:  01/03/18               Expected Discharge Plan:  (Independent from home)  In-House Referral:     Discharge planning Services  CM Consult  Post Acute Care Choice:    Choice offered to:     DME Arranged:    DME Agency:     HH Arranged:    HH Agency:     Status of Service:     If discussed at [employer], dates discussed:    Additional Comments:  Maryclare Labrador, RN 01/01/2018, 3:10 PM

## 2018-01-01 NOTE — Discharge Instructions (Signed)
Thoracotomy, Care After ° °This sheet gives you information about how to care for yourself after your procedure. Your doctor may also give you more specific instructions. If you have problems or questions, contact your doctor. °Follow these instructions at home: °Preventing lung infection (  °pneumonia) °· Take deep breaths or do breathing exercises as told by your doctor. °· Cough often. Coughing is important to clear thick spit (phlegm) and open your lungs. If coughing hurts, hold a pillow against your chest or place both hands flat on top of your cut (splinting) when you cough. This may help with discomfort. °· Use an incentive spirometer as told. This is a tool that measures how well you fill your lungs with each breath. °· Do lung therapy (pulmonary rehabilitation) as told. °Medicines °· Take over-the-counter or prescription medicines only as told by your doctor. °· If you have pain, take pain-relieving medicine before your pain gets very bad. This will help you breathe and cough more comfortably. °· If you were prescribed an antibiotic medicine, take it as told by your doctor. Do not stop taking the antibiotic even if you start to feel better. °Activity °· Ask your doctor what activities are safe for you. °· Do not travel by airplane for 2 weeks after your chest tube is removed, or until your doctor says that this is safe. °· Do not lift anything that is heavier than 10 lb (4.5 kg), or the limit that your doctor tells you, until he or she says that it is safe. °· Do not drive until your doctor approves. °¨ Do not drive or use heavy machinery while taking prescription pain medicine. °Incision care °· Follow instructions from your doctor about how to take care of your cut from surgery (incision). Make sure you: °¨ Wash your hands with soap and water before you change your bandage (dressing). If you cannot use soap and water, use hand sanitizer. °¨ Change your bandage as told by your doctor. °¨ Leave stitches  (sutures), skin glue, or skin tape (adhesive) strips in place. They may need to stay in place for 2 weeks or longer. If tape strips get loose and curl up, you may trim the loose edges. Do not remove tape strips completely unless your doctor says it is okay. °· Keep your bandage dry. °· Check your cut from surgery every day for signs of infection. Check for: °¨ More redness, swelling, or pain. °¨ More fluid or blood. °¨ Warmth. °¨ Pus or a bad smell. °Bathing °· Do not take baths, swim, or use a hot tub until your doctor approves. You may take showers. °· After your bandage has been removed, use soap and water to gently wash your cut from surgery. Do not use anything else to clean your cut unless your doctor tells you to. °Eating and drinking °· Eat a healthy diet as told by your doctor. A healthy diet includes: °¨ Fresh fruits and vegetables. °¨ Whole grains. °¨ Low-fat (lean) proteins. °· Drink enough fluid to keep your pee (urine) clear or pale yellow. °General instructions °· To prevent or treat trouble pooping (constipation) while you are taking prescription pain medicine, your doctor may recommend that you: °¨ Take over-the-counter or prescription medicines. °¨ Eat foods that are high in fiber. These include fresh fruits and vegetables, whole grains, and beans. °¨ Limit foods that are high in fat and processed sugars, such as fried and sweet foods. °· Do not use any products that contain nicotine or tobacco. These   include cigarettes and e-cigarettes. If you need help quitting, ask your doctor. °· Avoid secondhand smoke. °· Wear compression stockings as told. These help to prevent blood clots and reduce swelling in your legs. °· If you have a chest tube, care for it as told. °· Keep all follow-up visits as told by your doctor. This is important. °Contact a doctor if: °· You have more redness, swelling, or pain around your cut from surgery. °· You have more fluid or blood coming from your cut from  surgery. °· Your cut from surgery feels warm to the touch. °· You have pus or a bad smell coming from your cut from surgery. °· You have a fever or chills. °· Your heartbeat seems uneven. °· You feel sick to your stomach (nauseous). °· You throw up (vomit). °· You have muscle aches. °· You have trouble pooping (having a bowel movement). This may mean that you: °¨ Poop fewer times in a week than normal. °¨ Have a hard time pooping. °¨ Have poop that is dry, hard, or bigger than normal. °Get help right away if: °· You get a rash. °· You feel light-headed. °· You feel like you might pass out (faint). °· You are short of breath. °· You have trouble breathing. °· You are confused. °· You have trouble talking. °· You have problems with your seeing (vision). °· You are not able to move. °· You lose feeling (have numbness) in your: °¨ Face. °¨ Arms. °¨ Legs. °· You pass out. °· You have a sudden, bad headache. °· You feel weak. °· You have chest pain. °· You have pain that: °¨ Is very bad. °¨ Gets worse, even with medicine. °Summary °· Take deep breaths, do breathing exercises, and cough often. This helps prevent lung infection (pneumonia). °· Do not drive until your doctor approves. Do not travel by airplane for 2 weeks after your chest tube is removed, or until your doctor says that this is safe. °· Check your cut from surgery every day for signs of infection. °· Eat a healthy diet. This includes fresh fruits and vegetables, whole grains, and low-fat (lean) proteins. °This information is not intended to replace advice given to you by your health care provider. Make sure you discuss any questions you have with your health care provider. °Document Released: 12/06/2011 Document Revised: 02/29/2016 Document Reviewed: 02/29/2016 °Elsevier Interactive Patient Education © 2017 [employer] Inc[employer] ° °

## 2018-01-01 NOTE — Progress Notes (Addendum)
      [address]       [place] [address]             [phone]       3 Days Post-Op Procedure(s) (LRB): VIDEO BRONCHOSCOPY (N/A) Left VIDEO ASSISTED THORACOSCOPY with Mini Thoracotomy, Left Lower Lobe Lobectomy, Node Dissection,  Insertion of  ONQ pain pump (Left)  Subjective: Patient passing gas but no bowel movement yet. She has already walked this am.  Objective: Vital signs in last 24 hours: Temp:  [98 F (36.7 C)-98.9 F (37.2 C)] 98.1 F (36.7 C) (07/15 0448) Pulse Rate:  [79-103] 87 (07/15 0448) Cardiac Rhythm: Normal sinus rhythm (07/15 0448) Resp:  [14-22] 14 (07/14 2302) BP: (130-159)/(82-108) 136/84 (07/14 2302) SpO2:  [88 %-100 %] 98 % (07/15 0448)     Intake/Output from previous day: 07/14 0701 - 07/15 0700 In: 95.4 [I.V.:95.4] Out: 1805 [Urine:1725; Chest Tube:50]   Physical Exam:  Cardiovascular: RRR Pulmonary: Mostly clear bilaterally Abdomen: Soft, non tender, bowel sounds present. Extremities: No lower extremity edema. Wounds: Clean and dry.  No erythema or signs of infection. Chest Tube: to water seal; small, intermittent air leak with cough  Lab Results: CBC: Recent Labs    12/31/17 0412 01/01/18 0234  WBC 7.3 6.1  HGB 10.3* 10.7*  HCT 33.5* 33.7*  PLT 186 205   BMET:  Recent Labs    12/31/17 0412 01/01/18 0234  NA 139 141  K 4.0 3.8  CL 106 104  CO2 29 29  GLUCOSE 108* 92  BUN 12 9  CREATININE 0.86 0.77  CALCIUM 8.1* 8.5*    PT/INR: No results for input(s): LABPROT, INR in the last 72 hours. ABG:  INR: Will add last result for INR, ABG once components are confirmed Will add last 4 CBG results once components are confirmed  Assessment/Plan:  1. CV -SR. Patient hypertensive while I was in the room. She states she has "white coat hypertension".  2.  Pulmonary - Chest tube with 50 cc of output for 12 hours? Chest tube is to water seal. There is a small, intermittent air leak with cough. On room air  this am. CXR this am appears stable (no pneumothorax). Encourage incentive spirometer. Dr. Servando Snare to decide if remove chest tube today or in am. Await final pathology.    Patricia M ZimmermanPA-C 01/01/2018,7:41 AM [phone]  Seen , small air leak, rare bubble, path still pending  I have seen and examined Patricia Glenn and agree with the above assessment  and plan.  Patricia Isaac MD Beeper [phone] Office [phone] 01/02/2018 7:40 AM

## 2018-01-01 NOTE — Progress Notes (Signed)
Q-Pump removed , tolerated well. Chest tube redressed.

## 2018-01-02 ENCOUNTER — Inpatient Hospital Stay (HOSPITAL_COMMUNITY): Payer: Managed Care, Other (non HMO)

## 2018-01-02 LAB — GLUCOSE, CAPILLARY: Glucose-Capillary: 100 mg/dL — ABNORMAL HIGH (ref 70–99)

## 2018-01-02 NOTE — Progress Notes (Addendum)
      [address]       ,[place] [address]             [phone]       4 Days Post-Op Procedure(s) (LRB): VIDEO BRONCHOSCOPY (N/A) Left VIDEO ASSISTED THORACOSCOPY with Mini Thoracotomy, Left Lower Lobe Lobectomy, Node Dissection,  Insertion of  ONQ pain pump (Left)  Subjective: Patient without complaints this am.  Objective: Vital signs in last 24 hours: Temp:  [97.7 F (36.5 C)-98.4 F (36.9 C)] 97.7 F (36.5 C) (07/16 0515) Pulse Rate:  [79-97] 83 (07/16 0515) Cardiac Rhythm: Normal sinus rhythm (07/16 0400) Resp:  [12-26] 26 (07/16 0515) BP: (104-146)/(92-97) 136/92 (07/16 0515) SpO2:  [99 %-100 %] 100 % (07/16 0515)     Intake/Output from previous day: 07/15 0701 - 07/16 0700 In: 694 [P.O.:684; I.V.:10] Out: 120 [Chest Tube:120]   Physical Exam:  Cardiovascular: RRR Pulmonary: Mostly clear bilaterally Abdomen: Soft, non tender, bowel sounds present. Extremities: No lower extremity edema. Wounds: Clean and dry.  No erythema or signs of infection. Chest Tube: to water seal; +1 intermittent air leak with cough  Lab Results: CBC: Recent Labs    12/31/17 0412 01/01/18 0234  WBC 7.3 6.1  HGB 10.3* 10.7*  HCT 33.5* 33.7*  PLT 186 205   BMET:  Recent Labs    12/31/17 0412 01/01/18 0234  NA 139 141  K 4.0 3.8  CL 106 104  CO2 29 29  GLUCOSE 108* 92  BUN 12 9  CREATININE 0.86 0.77  CALCIUM 8.1* 8.5*    PT/INR: No results for input(s): LABPROT, INR in the last 72 hours. ABG:  INR: Will add last result for INR, ABG once components are confirmed Will add last 4 CBG results once components are confirmed  Assessment/Plan:  1. CV -SR.  2.  Pulmonary - On room air. Chest tube recorded as 120 cc of output for 24 hours but by my mark 90 cc.Chest tube is to water seal. There is a +1 intermittent air leak with cough. On room air this am. CXR this am appears stable (possible miniscule left apical pneumothorax and left base atelectasis)  Encourage incentive spirometer.Hope to remove chest tube. Await final pathology.    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 Occasional bubble with cough, leave chest tube today I have seen and examined Geraldine Contras and agree with the above assessment  and plan.  Grace Isaac MD Beeper [phone] Office [phone] 01/02/2018 7:43 AM

## 2018-01-03 ENCOUNTER — Inpatient Hospital Stay (HOSPITAL_COMMUNITY): Payer: Managed Care, Other (non HMO)

## 2018-01-03 LAB — AEROBIC/ANAEROBIC CULTURE W GRAM STAIN (SURGICAL/DEEP WOUND)
Culture: NO GROWTH
Gram Stain: NONE SEEN

## 2018-01-03 MED ORDER — ACETAMINOPHEN 500 MG PO TABS
1000.0000 mg | ORAL_TABLET | Freq: Four times a day (QID) | ORAL | Status: DC | PRN
  Administered 2018-01-03 – 2018-01-05 (×6): 1000 mg via ORAL
  Filled 2018-01-03 (×6): qty 2

## 2018-01-03 NOTE — Care Management (Addendum)
[place] likely can not be guaranteed at discharge with [place].  [place] referral  process is as follows; referral will be called in to [facility] and they are responsible for locating [facility] for pt (CM can not make referral to [facility] for [place]).   CM experience is that there is significant lack of HH resources available that are covered by [facility] and it is likely that even though referral is accepted by [facility] at discharge there will be delay with the establishment of HH.  Pt may have to use an out of network provider   CM communicated this barrier to attending team; pt has had a change in status and therefore no longer being considered for mini express nor pluera vac - pt will need to placed back on suction.  CM will continue to follow

## 2018-01-03 NOTE — Plan of Care (Signed)
Continue with plan of care.  

## 2018-01-03 NOTE — Progress Notes (Addendum)
      [address]       [place] [address]             [phone]       5 Days Post-Op Procedure(s) (LRB): VIDEO BRONCHOSCOPY (N/A) Left VIDEO ASSISTED THORACOSCOPY with Mini Thoracotomy, Left Lower Lobe Lobectomy, Node Dissection,  Insertion of  ONQ pain pump (Left)  Subjective: Patient without complaints this am.  Objective: Vital signs in last 24 hours: Temp:  [97.5 F (36.4 C)-98.4 F (36.9 C)] 97.5 F (36.4 C) (07/17 0334) Pulse Rate:  [99-101] 101 (07/17 0334) Cardiac Rhythm: Normal sinus rhythm (07/17 0418) Resp:  [13-23] 13 (07/17 0334) BP: (125-147)/(87-96) 141/92 (07/17 0334) SpO2:  [98 %-100 %] 100 % (07/17 0334)     Intake/Output from previous day: 07/16 0701 - 07/17 0700 In: 240 [P.O.:240] Out: 151 [Urine:1; Chest Tube:150]   Physical Exam:  Cardiovascular: RRR Pulmonary: Mostly clear bilaterally Abdomen: Soft, non tender, bowel sounds present. Extremities: No lower extremity edema. Wounds: Clean and dry.  No erythema or signs of infection. Chest Tube: to water seal; persistent +1 intermittent air leak with cough  Lab Results: CBC: Recent Labs    01/01/18 0234  WBC 6.1  HGB 10.7*  HCT 33.7*  PLT 205   BMET:  Recent Labs    01/01/18 0234  NA 141  K 3.8  CL 104  CO2 29  GLUCOSE 92  BUN 9  CREATININE 0.77  CALCIUM 8.5*    PT/INR: No results for input(s): LABPROT, INR in the last 72 hours. ABG:  INR: Will add last result for INR, ABG once components are confirmed Will add last 4 CBG results once components are confirmed  Assessment/Plan:  1. CV -SR.  2.  Pulmonary - On room air. Chest tube recorded as 150 cc of output for 24 hours but by my mark 90 cc.Chest tube is to water seal. There is a +1 intermittent air leak with cough. On room air this am. CXR this am appears stable . As discussed with Dr. Servando Snare, will place chest tube back to suction. Check CXR in am. Encourage incentive spirometer.  Donielle M  ZimmermanPA-C 01/03/2018,7:14 AM [phone]  I have seen and examined Geraldine Contras and agree with the above assessment  and plan.  Grace Isaac MD Beeper [phone] Office [phone] 01/03/2018 5:09 PM

## 2018-01-04 ENCOUNTER — Inpatient Hospital Stay (HOSPITAL_COMMUNITY): Payer: Managed Care, Other (non HMO)

## 2018-01-04 ENCOUNTER — Other Ambulatory Visit: Payer: Self-pay | Admitting: *Deleted

## 2018-01-04 NOTE — Progress Notes (Addendum)
      [address]       [place] [address]             [phone]       6 Days Post-Op Procedure(s) (LRB): VIDEO BRONCHOSCOPY (N/A) Left VIDEO ASSISTED THORACOSCOPY with Mini Thoracotomy, Left Lower Lobe Lobectomy, Node Dissection,  Insertion of  ONQ pain pump (Left)  Subjective: Patient without complaints this am.  Objective: Vital signs in last 24 hours: Temp:  [97.6 F (36.4 C)-98.7 F (37.1 C)] 98.1 F (36.7 C) (07/18 0412) Pulse Rate:  [89-150] 150 (07/18 0700) Cardiac Rhythm: Normal sinus rhythm (07/18 0300) Resp:  [12-24] 15 (07/18 0700) BP: (129-142)/(73-94) 138/89 (07/18 0412) SpO2:  [75 %-100 %] 75 % (07/18 0700)     Intake/Output from previous day: 07/17 0701 - 07/18 0700 In: 730 [P.O.:720; I.V.:10] Out: 190 [Chest Tube:190]   Physical Exam:  Cardiovascular: RRR Pulmonary: Slightly diminished leftt base and clear on the right Abdomen: Soft, non tender, bowel sounds present. Extremities: No lower extremity edema. Wounds: Clean and dry.  No erythema or signs of infection. Chest Tube: to suction and NO air leak  Lab Results: CBC: No results for input(s): WBC, HGB, HCT, PLT in the last 72 hours. BMET:  No results for input(s): NA, K, CL, CO2, GLUCOSE, BUN, CREATININE, CALCIUM in the last 72 hours.  PT/INR: No results for input(s): LABPROT, INR in the last 72 hours. ABG:  INR: Will add last result for INR, ABG once components are confirmed Will add last 4 CBG results once components are confirmed  Assessment/Plan:  1. CV -SR.  2.  Pulmonary - On room air. Chest tube recorded as 190 cc (170 cc by my mark) of output for 24 hours.Chest tube is to suction. There is NO air leak. CXR this am appears stable .  Check CXR in am. Encourage incentive spirometer.  Patricia M ZimmermanPA-C 01/04/2018,7:18 AM [phone]  Ct to water seal Discussed with patient and husband discussion at [facility] this am, likely no chemo will be recommended but  will have fu with medical oncology  I have seen and examined Patricia Glenn and agree with the above assessment  and plan.  Grace Isaac MD Beeper [phone] Office [phone] 01/04/2018 9:00 AM

## 2018-01-05 ENCOUNTER — Telehealth: Payer: Self-pay | Admitting: *Deleted

## 2018-01-05 ENCOUNTER — Inpatient Hospital Stay (HOSPITAL_COMMUNITY): Payer: Managed Care, Other (non HMO)

## 2018-01-05 NOTE — Telephone Encounter (Signed)
Oncology Nurse Navigator Documentation  Oncology Nurse Navigator Flowsheets 01/05/2018  Navigator Location [facility]  Navigator Encounter Type Telephone/I received referral from Dr. Servando Snare.  Patient has been referred to [facility] and I noticed she lives in [place].  I called patient to get clarification on where she would like to be treated. I was unable to reach her but did leave vm message for her to call me with my name and phone number   Telephone Outgoing Call  Barriers/Navigation Needs Coordination of Care  Interventions Coordination of Care  Coordination of Care Other  Acuity Level 2  Time Spent with Patient 30

## 2018-01-05 NOTE — Progress Notes (Signed)
[address]       [place] [address]             [phone]                 7 Days Post-Op Procedure(s) (LRB): VIDEO BRONCHOSCOPY (N/A) Left VIDEO ASSISTED THORACOSCOPY with Mini Thoracotomy, Left Lower Lobe Lobectomy, Node Dissection,  Insertion of  ONQ pain pump (Left)  LOS: 7 days   Subjective: comfortable  Objective: Vital signs in last 24 hours: Patient Vitals for the past 24 hrs:  BP Temp Temp src Pulse Resp SpO2  01/05/18 0816 129/88 98.1 F (36.7 C) Oral 92 14 -  01/05/18 0525 - - - - 19 -  01/05/18 0324 129/80 97.8 F (36.6 C) Oral 89 (!) 22 96 %  01/05/18 0300 - - - - 17 -  01/04/18 2342 134/77 97.9 F (36.6 C) Oral 91 17 95 %  01/04/18 2300 - - - - 15 -  01/04/18 1915 (!) 136/91 98.2 F (36.8 C) Oral (!) 107 (!) 26 94 %  01/04/18 1720 131/88 98.7 F (37.1 C) Oral - (!) 25 -  01/04/18 1200 127/84 98.1 F (36.7 C) Oral - 12 -    Filed Weights   12/30/17 0600  Weight: 213 lb 3 oz (96.7 kg)    Hemodynamic parameters for last 24 hours:    Intake/Output from previous day: 07/18 0701 - 07/19 0700 In: 1220 [P.O.:1200; I.V.:20] Out: 85 [Urine:5; Chest Tube:80] Intake/Output this shift: No intake/output data recorded.  Scheduled Meds: . bisacodyl  10 mg Oral Daily  . Chlorhexidine Gluconate Cloth  6 each Topical Daily  . enoxaparin (LOVENOX) injection  40 mg Subcutaneous Q24H  . mouth rinse  15 mL Mouth Rinse BID  . prenatal multivitamin  1 tablet Oral Daily  . senna-docusate  1 tablet Oral QHS  . sodium chloride flush  10-40 mL Intracatheter Q12H   Continuous Infusions: . bupivacaine 0.5 % ON-Q pump SINGLE CATH 400 mL    . dextrose 5 % and 0.9 % NaCl with KCl 20 mEq/L Stopped (12/31/17 1433)  . potassium chloride     PRN Meds:.acetaminophen, diphenhydrAMINE **OR** diphenhydrAMINE, naloxone **AND** sodium chloride flush, ondansetron (ZOFRAN) IV, potassium chloride, sodium chloride flush, traMADol  General appearance: alert  and cooperative Neurologic: intact Heart: regular rate and rhythm, S1, S2 normal, no murmur, click, rub or gallop Lungs: clear to auscultation bilaterally Abdomen: soft, non-tender; bowel sounds normal; no masses,  no organomegaly Extremities: extremities normal, atraumatic, no cyanosis or edema and Homans sign is negative, no sign of DVT Wound: no air leak this am, wound intact no infection  Lab Results: CBC:No results for input(s): WBC, HGB, HCT, PLT in the last 72 hours. BMET: No results for input(s): NA, K, CL, CO2, GLUCOSE, BUN, CREATININE, CALCIUM in the last 72 hours.  PT/INR: No results for input(s): LABPROT, INR in the last 72 hours.   Radiology Dg Chest 2 View  Result Date: 01/04/2018 CLINICAL DATA:  Pneumothorax, chest tube, left lower lobectomy EXAM: CHEST - 2 VIEW COMPARISON:  Chest radiograph from one day prior. FINDINGS: Left chest tube is stable with the tip in the medial upper left pleural space. Stable cardiomediastinal silhouette with normal heart size. No convincing pneumothorax. No pleural effusion. Stable curvilinear opacities at the left lung base with volume loss in the left hemithorax. No pulmonary edema. No acute consolidative airspace disease. IMPRESSION: 1. Stable left chest tube with no convincing  pneumothorax. 2. Stable curvilinear opacities at the left lung base, favor scarring or atelectasis. 3. Stable volume loss in the left hemithorax from left lower lobectomy. Electronically Signed   By: Ilona Sorrel M.D.   On: 01/04/2018 10:51     Assessment/Plan: S/P Procedure(s) (LRB): VIDEO BRONCHOSCOPY (N/A) Left VIDEO ASSISTED THORACOSCOPY with Mini Thoracotomy, Left Lower Lobe Lobectomy, Node Dissection,  Insertion of  ONQ pain pump (Left) Mobilize D/c chest tube today, follow up chest xrya and poss home in am Discussed path with patient and husband, will need out patient consult with medical oncology , but likely will not recommend chemo after discusstion at  [place] MD 01/05/2018 8:33 AM     Patient ID: Patricia Glenn, female   DOB: 08/21/1985, 32 y.o.   MRN: [id number]

## 2018-01-06 ENCOUNTER — Inpatient Hospital Stay (HOSPITAL_COMMUNITY): Payer: Managed Care, Other (non HMO)

## 2018-01-06 MED ORDER — IBUPROFEN 800 MG PO TABS: 800.0000 mg | ORAL_TABLET | Freq: Three times a day (TID) | ORAL | 0 refills | Status: DC | PRN

## 2018-01-06 MED ORDER — ACETAMINOPHEN 500 MG PO TABS: 1000.0000 mg | ORAL_TABLET | Freq: Four times a day (QID) | ORAL | 0 refills | Status: DC | PRN

## 2018-01-06 NOTE — Progress Notes (Signed)
      [address]       [place] [address]             [phone]      8 Days Post-Op Procedure(s) (LRB): VIDEO BRONCHOSCOPY (N/A) Left VIDEO ASSISTED THORACOSCOPY with Mini Thoracotomy, Left Lower Lobe Lobectomy, Node Dissection,  Insertion of  ONQ pain pump (Left)   Subjective:  No new complaints.  Feels great and is ready to go home.  Objective: Vital signs in last 24 hours: Temp:  [98 F (36.7 C)-98.2 F (36.8 C)] 98.1 F (36.7 C) (07/20 0733) Pulse Rate:  [88-110] 93 (07/20 0733) Cardiac Rhythm: Normal sinus rhythm (07/20 0742) Resp:  [11-19] 19 (07/20 0733) BP: (112-134)/(77-105) 124/88 (07/20 0733) SpO2:  [98 %-100 %] 100 % (07/20 0733)  Intake/Output from previous day: 07/19 0701 - 07/20 0700 In: 480 [P.O.:480] Out: -   General appearance: alert, cooperative and no distress Heart: regular rate and rhythm Lungs: clear to auscultation bilaterally Abdomen: soft, non-tender; bowel sounds normal; no masses,  no organomegaly Extremities: extremities normal, atraumatic, no cyanosis or edema Wound: clean and dry  Lab Results: No results for input(s): WBC, HGB, HCT, PLT in the last 72 hours. BMET: No results for input(s): NA, K, CL, CO2, GLUCOSE, BUN, CREATININE, CALCIUM in the last 72 hours.  PT/INR: No results for input(s): LABPROT, INR in the last 72 hours. ABG    Component Value Date/Time   PHART 7.347 (L) 12/29/2017 1305   HCO3 26.2 12/29/2017 1305   TCO2 28 12/29/2017 1305   O2SAT 100.0 12/29/2017 1305   CBG (last 3)  No results for input(s): GLUCAP in the last 72 hours.  Assessment/Plan: S/P Procedure(s) (LRB): VIDEO BRONCHOSCOPY (N/A) Left VIDEO ASSISTED THORACOSCOPY with Mini Thoracotomy, Left Lower Lobe Lobectomy, Node Dissection,  Insertion of  ONQ pain pump (Left)  1. CV- hemodynamically stable  2. Pulm- no acute issues, off oxygen, CXR with questionable minimal apical pneumothorax 3. Dispo- patient stable, will d/c home  today   LOS: 8 days    Ellwood Handler 01/06/2018

## 2018-01-08 ENCOUNTER — Telehealth: Payer: Self-pay | Admitting: *Deleted

## 2018-01-08 DIAGNOSIS — C7B09 Secondary carcinoid tumors of other sites: Principal | ICD-10-CM

## 2018-01-08 DIAGNOSIS — C7A00 Malignant carcinoid tumor of unspecified site: Secondary | ICD-10-CM

## 2018-01-08 LAB — ACID FAST SMEAR (AFB, MYCOBACTERIA): Acid Fast Smear: NEGATIVE

## 2018-01-08 NOTE — Telephone Encounter (Signed)
Oncology Nurse Navigator Documentation  Oncology Nurse Navigator Flowsheets 01/08/2018  Navigator Location [facility]  Navigator Encounter Type Telephone/I contacted [facility] to help coordinate an appt with Dr. Servando Snare and Dr. Julien Nordmann due to patient living in [place].  I was updated and called patient with appt times.  She verbalized understanding.   Telephone Outgoing Call  Barriers/Navigation Needs Coordination of Care  Interventions Coordination of Care  Coordination of Care Appts;Other  Acuity Level 2  Time Spent with Patient 45

## 2018-01-18 NOTE — Op Note (Signed)
NAME: Patricia Glenn, Patricia Glenn MEDICAL RECORD RD:[id number] ACCOUNT [id number] DATE OF BIRTH:[birth year] FACILITY: [facility] LOCATION: [facility] PHYSICIAN:Kamorie Aldous Maryruth Bun, MD  OPERATIVE REPORT  DATE OF PROCEDURE:  12/29/2017  PROCEDURE PERFORMED:  Video bronchoscopy, left lower lobectomy with lymph node dissection and placement of On-Q.  SURGEON:  Lanelle Bal, MD  FIRST ASSISTANT:  Gaye Alken  , RNFA, and Ezra Sites, RNFA  BRIEF HISTORY:  The patient is a 32 year old female who had increasing difficulties with recurrent right lower lobe pneumonia.  During a recent pregnancy, she began having episodes of hemoptysis.  After early delivery of her child without incident, CT  scan of the chest and PET scans were performed that showed a left lower lobe lung mass.  Bronchoscopy was performed at [facility] showing an endobronchial mass involving the left lower lobe just distal to the takeoff of the superior segment.  By scan and  PET scan, this appeared to be a carcinoid, but no definitive tissue diagnosis was made.  The patient was referred for consideration of surgical resection.  After examining the patient's films, we recommended to her proceeding with repeat bronchoscopy and  surgical resection and discussed with her preserving the left upper lobe if possible.  Risks and options of surgery were discussed with her in detail, and she agreed and signed informed consent.  DESCRIPTION OF PROCEDURE:  The patient underwent general endotracheal anesthesia without incident.  Initially, a single-lumen endotracheal tube was placed.  Appropriate timeout was performed, and we proceeded with video bronchoscopy to the subsegmental  level in both the left and right tracheobronchial tree.  The findings of the bronchoscopy were similar to photographs from procedure done at [facility].  There was an endobronchial lesion occlusive of the left lower lobe bronchus just past the takeoff of  the superior segment.  No  other endobronchial lesions were appreciated.  We then removed the single-lumen tube and placed a double-lumen endotracheal tube and turned the patient in lateral decubitus position with the left side up.  We then proceeded with  prepping the left chest with Betadine and draped in a sterile manner.  A second timeout was performed.  We then proceeded with first a small incision approximately 4th intercostal space midaxillary line.  A 30-degree 5 mm videoscope was placed.  On  examination through the chest, the major fissure appeared fairly complete.  There was an obvious mass-like lesion at the fissure.  To facilitate this resection, we then placed 2 additional port sites and enlarged our initial incision slightly.  Through  this incision and with the additional 2 ports, we then proceeded first to complete the fissure and identified the mass along and surrounding the left lower lobe bronchus.  The lower lobe was then elevated, and the inferior pulmonary ligament divided up  to the inferior pulmonary vein.  The vein was encircled and then divided with a vascular stapler.  We then, with a fairly tedious dissection, dissected the pulmonary artery branches to the lower lobe and superior segment off the tumor.  As this  dissection proceeded, there were some lobular areas of tissue and mass along the mass, and these were sent as peri-mass lymph nodes.  Also, frozen section on the inferior pulmonary ligament node was performed.  The peri-mass nodal tissue was found to be  consistent with carcinoid.  We continued dissection, dividing the pulmonary artery branches to the lower lobe, leaving just the bronchus.  We then identified the takeoff of the superior segment bronchus, and as proximal as possible  identified the  bronchus to obtain as great a margin as possible.  We used a 30 mm TA stapler across the bronchus and divided sharply the distal bronchus to the lower lobe through a specimen bag.  The specimen was brought  out through the incision.  On careful examination,  there was significant purulent material behind the endobronchial lesion.  The division of the bronchus appeared clear grossly of the tumor.  This was submitted to pathology and confirmed.  The bronchial margin was negative on frozen section.  Additional  nodal stations were sampled and submitted, labeled appropriately to pathology.  The bronchial stump was tested for air leak.  We then tunneled the On-Q device subpleurally along the posterior ribs and brought out through a separate site on the chest  wall.  Two chest tubes, 1 Blake drain and 1 standard, were placed into the chest through the port sites.  The working incision was then closed with pericostal stitches drilled through the lower rib and reapproximated.  The muscle layer was closed with  interrupted 0 Vicryl and 3-0 Vicryl subcutaneous tissue and a subcuticular stitch in skin edges.  The lung had nicely reinflated.  There was no residual air leak at the completion of the case.  The patient tolerated the case without difficulty.  She did  not require any blood bank blood products during the procedure.  Sponge and needle count was reported as correct.  Estimated blood loss was less than 200 mL.  LN/NUANCE  D:01/17/2018 T:01/17/2018 JOB:[id number]

## 2018-01-22 ENCOUNTER — Encounter: Payer: Self-pay | Admitting: Internal Medicine

## 2018-01-22 ENCOUNTER — Other Ambulatory Visit: Payer: Self-pay | Admitting: Cardiothoracic Surgery

## 2018-01-22 ENCOUNTER — Ambulatory Visit (INDEPENDENT_AMBULATORY_CARE_PROVIDER_SITE_OTHER): Payer: Self-pay | Admitting: Physician Assistant

## 2018-01-22 ENCOUNTER — Ambulatory Visit
Admission: RE | Admit: 2018-01-22 | Discharge: 2018-01-22 | Disposition: A | Discharge status: Home / Self Care | Payer: Managed Care, Other (non HMO) | Source: Ambulatory Visit | Attending: Cardiothoracic Surgery | Admitting: Cardiothoracic Surgery

## 2018-01-22 ENCOUNTER — Telehealth: Payer: Self-pay

## 2018-01-22 ENCOUNTER — Inpatient Hospital Stay: Payer: Managed Care, Other (non HMO) | Attending: Internal Medicine

## 2018-01-22 ENCOUNTER — Other Ambulatory Visit: Payer: Self-pay | Admitting: Internal Medicine

## 2018-01-22 ENCOUNTER — Ambulatory Visit: Payer: Managed Care, Other (non HMO)

## 2018-01-22 ENCOUNTER — Inpatient Hospital Stay (HOSPITAL_BASED_OUTPATIENT_CLINIC_OR_DEPARTMENT_OTHER): Payer: Managed Care, Other (non HMO) | Admitting: Internal Medicine

## 2018-01-22 ENCOUNTER — Encounter: Payer: Self-pay | Admitting: Physician Assistant

## 2018-01-22 VITALS — BP 141/85 | HR 87 | Temp 98.8°F | Resp 18 | Ht 67.0 in | Wt 212.4 lb

## 2018-01-22 VITALS — BP 150/85 | HR 86 | Resp 20 | Ht 67.0 in | Wt 211.0 lb

## 2018-01-22 DIAGNOSIS — C7A00 Malignant carcinoid tumor of unspecified site: Secondary | ICD-10-CM

## 2018-01-22 DIAGNOSIS — C7A090 Malignant carcinoid tumor of the bronchus and lung: Secondary | ICD-10-CM | POA: Insufficient documentation

## 2018-01-22 DIAGNOSIS — R51 Headache: Secondary | ICD-10-CM

## 2018-01-22 DIAGNOSIS — C7B09 Secondary carcinoid tumors of other sites: Principal | ICD-10-CM

## 2018-01-22 DIAGNOSIS — Z902 Acquired absence of lung [part of]: Secondary | ICD-10-CM

## 2018-01-22 DIAGNOSIS — R918 Other nonspecific abnormal finding of lung field: Secondary | ICD-10-CM

## 2018-01-22 DIAGNOSIS — Z09 Encounter for follow-up examination after completed treatment for conditions other than malignant neoplasm: Secondary | ICD-10-CM

## 2018-01-22 LAB — CBC WITH DIFFERENTIAL (CANCER CENTER ONLY)
BASOS ABS: 0 10*3/uL (ref 0.0–0.1)
BASOS PCT: 1 %
EOS ABS: 0.2 10*3/uL (ref 0.0–0.5)
Eosinophils Relative: 3 %
HCT: 36.9 % (ref 34.8–46.6)
HEMOGLOBIN: 12.2 g/dL (ref 11.6–15.9)
Lymphocytes Relative: 34 %
Lymphs Abs: 2.2 10*3/uL (ref 0.9–3.3)
MCH: 27.6 pg (ref 25.1–34.0)
MCHC: 33 g/dL (ref 31.5–36.0)
MCV: 83.6 fL (ref 79.5–101.0)
MONOS PCT: 6 %
Monocytes Absolute: 0.4 10*3/uL (ref 0.1–0.9)
NEUTROS PCT: 56 %
Neutro Abs: 3.5 10*3/uL (ref 1.5–6.5)
Platelet Count: 314 10*3/uL (ref 145–400)
RBC: 4.41 MIL/uL (ref 3.70–5.45)
RDW: 14.2 % (ref 11.2–14.5)
WBC Count: 6.3 10*3/uL (ref 3.9–10.3)

## 2018-01-22 LAB — CMP (CANCER CENTER ONLY)
ALK PHOS: 92 U/L (ref 38–126)
ALT: 30 U/L (ref 0–44)
ANION GAP: 11 (ref 5–15)
AST: 19 U/L (ref 15–41)
Albumin: 4.2 g/dL (ref 3.5–5.0)
BILIRUBIN TOTAL: 0.3 mg/dL (ref 0.3–1.2)
BUN: 17 mg/dL (ref 6–20)
CALCIUM: 9.3 mg/dL (ref 8.9–10.3)
CO2: 27 mmol/L (ref 22–32)
CREATININE: 0.95 mg/dL (ref 0.44–1.00)
Chloride: 102 mmol/L (ref 98–111)
Glucose, Bld: 92 mg/dL (ref 70–99)
Potassium: 3.8 mmol/L (ref 3.5–5.1)
Sodium: 140 mmol/L (ref 135–145)
TOTAL PROTEIN: 7.5 g/dL (ref 6.5–8.1)

## 2018-01-22 NOTE — Patient Instructions (Signed)
You may return to driving an automobile as long as you are no longer requiring oral narcotic pain relievers during the daytime.  It would be wise to start driving only short distances during the daylight and gradually increase from there as you feel comfortable.You may continue to gradually increase your physical activity as tolerated.   Refrain from any heavy lifting and avoid activities that cause increased pain in your chest on the side of your surgical incision.  Otherwise, you may continue to increase activities without any particular limitations.  Increase the intensity and duration of physical activity gradually.

## 2018-01-22 NOTE — Telephone Encounter (Signed)
Printed patient avs and calender of upcoming appointment. Per 8/5 los and in baskey\t M.M due to patient want to schedule lab, and ct at [facility]

## 2018-01-22 NOTE — Progress Notes (Signed)
[place] [phone]   [phone]  CONSULT NOTE  REFERRING PHYSICIAN: Dr. 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  HPI Patricia Glenn is a 32 y.o. female a never smoker with no significant past medical history.  The patient mentioned that in March 2018 she was diagnosed and treated for left lower lobe pneumonia.  She did fine for a while but in the winter 2018 she had 2 episodes of left lower lobe pneumonia.  This was treated with antibiotics and she had improvement in her condition.  She was in her third pregnancy when she presented with hemoptysis in May 2019.  She had labor induced on November 18 2017 months earlier than expected.  CT angiogram of the chest performed on Nov 15, 2017 showed a large left lower lobe mass extending from the left hilum to the left lung base and measuring 3.3 x 4.7 x 3.3 cm worrisome for malignancy.  The mass was lobulated and somewhat low-density.  There was a polypoid element projecting into the left lower lobe airway.  There was also occlusion of the left lower lobe bronchus.  A PET scan was performed on December 22, 2017 and that showed the left lower lobe perihilar lung lesion containing areas of calcification and measured 3.8 x 3.1 cm and has SUV max of 5.7.  There was postobstructive consolidation within the left lower lobe with atelectasis and volume loss identified.  This finding were suspicious for neuroendocrine tumor such as a pulmonary carcinoid.  There was no evidence for mediastinal nodal metastasis or distant metastatic disease. On December 29, 2017 the patient underwent video bronchoscopy with left VATS, left minithoracotomy and left lower lobectomy with lymph node dissection under the care of Dr. Servando Snare. The final pathology [id number]) was consistent with carcinoid tumor measuring 4.5 cm with peri-neural invasion identified as well as  lymphovascular invasion.  The resection margins were negative for malignancy.  2 of the perimass lymph nodes as well as 11 L lymph nodes showed metastatic carcinoid tumor. The patient is recovering well from her surgery and Dr. Servando Snare kindly referred her to me today for evaluation and recommendation regarding adjuvant therapy. When seen today the patient is feeling fine except for the soreness on the left side of the chest from the stitches.  She denied having any significant shortness of breath but has mild cough with walking.  She denied having any current hemoptysis.  She has no nausea, vomiting, diarrhea or constipation.  She denied having any recent weight loss or night sweats.  She has no headache or visual changes. Family history significant for paternal grandfather with bladder cancer.  Mother and father are healthy. The patient is married and has 3 children.  She is a Agricultural engineer.  She was accompanied today by her husband Patricia Glenn.  The patient has no history of smoking but drinks alcohol occasionally and no history of drug abuse.  HPI  Past Medical History:  Diagnosis Date  . Encounter for induction of labor 11/17/2017  . Encounter for supervision of other normal pregnancy, unspecified trimester 05/23/2017   31 y.o. G3P2002 at 80w1dby Patient's last menstrual period was 03/13/2017. Estimated Date of Delivery: 12/18/17  c/w 129w1dltrasound Sex of baby and name:  " "  Factors complicating this pregnancy   LLL pneumonia 24 weeks  Inpatient  Repeat CXR 3 weeks post _0   Elevated BP at NOB-  watch closely; baseline labs ordered and WNL-05/23/17  Hx of LGA , previous babies weighed 4309 grams and 4139 gra  . 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 Lung mass   . Mass of lower lobe of left lung 11/17/2017  . Pneumonia complicating pregnancy, second trimester 08/29/2017    Past Surgical History:  Procedure Laterality Date  . FLEXIBLE BRONCHOSCOPY N/A 11/29/2017    Procedure: FLEXIBLE BRONCHOSCOPY;  Surgeon: Wilhelmina Mcardle, MD;  Location: [facility] ORS;  Service: Pulmonary;  Laterality: N/A;  . VIDEO ASSISTED THORACOSCOPY (VATS)/WEDGE RESECTION Left 12/29/2017   Procedure: Left VIDEO ASSISTED THORACOSCOPY with Mini Thoracotomy, Left Lower Lobe Lobectomy, Node Dissection,  Insertion of  ONQ pain pump;  Surgeon: Grace Isaac, MD;  Location: [place];  Service: Thoracic;  Laterality: Left;  Marland Kitchen VIDEO BRONCHOSCOPY N/A 12/29/2017   Procedure: VIDEO BRONCHOSCOPY;  Surgeon: Grace Isaac, MD;  Location: [facility];  Service: Thoracic;  Laterality: N/A;    Family History  Problem Relation Age of Onset  . Bladder Cancer Maternal Grandfather 15    Social History Social History   Tobacco Use  . Smoking status: Never Smoker  . Smokeless tobacco: Never Used  Substance Use Topics  . Alcohol use: Yes    Frequency: Never    Comment: once a month/year none recently d/t pregnancy  . Drug use: No    No Known Allergies  Current Outpatient Medications  Medication Sig Dispense Refill  . OVER THE COUNTER MEDICATION lactation cookies    . PRENATAL 28-0.8 MG TABS Take 1 tablet by mouth daily.    Marland Kitchen acetaminophen (TYLENOL) 500 MG tablet Take 2 tablets (1,000 mg total) by mouth every 6 (six) hours as needed for mild pain (pain). (Patient not taking: Reported on 01/22/2018) 30 tablet 0  . ibuprofen (ADVIL,MOTRIN) 800 MG tablet Take 1 tablet (800 mg total) by mouth every 8 (eight) hours as needed. (Patient not taking: Reported on 01/22/2018) 30 tablet 0   No current facility-administered medications for this visit.     Review of Systems  Constitutional: negative Eyes: negative Ears, nose, mouth, throat, and face: negative Respiratory: positive for pleurisy/chest pain Cardiovascular: negative Gastrointestinal: negative Genitourinary:negative Integument/breast: negative Hematologic/lymphatic: negative Musculoskeletal:negative Neurological: negative Behavioral/Psych:  negative Endocrine: negative Allergic/Immunologic: negative  Physical Exam  IOM:BTDHR, healthy, no distress, well nourished and well developed SKIN: skin color, texture, turgor are normal, no rashes or significant lesions HEAD: Normocephalic, No masses, lesions, tenderness or abnormalities EYES: normal, PERRLA, Conjunctiva are pink and non-injected EARS: External ears normal, Canals clear OROPHARYNX:no exudate, no erythema and lips, buccal mucosa, and tongue normal  NECK: supple, no adenopathy, no JVD LYMPH:  no palpable lymphadenopathy, no hepatosplenomegaly BREAST:not examined LUNGS: clear to auscultation , and palpation HEART: regular rate & rhythm, no murmurs and no gallops ABDOMEN:abdomen soft, non-tender, normal bowel sounds and no masses or organomegaly BACK: Back symmetric, no curvature., No CVA tenderness EXTREMITIES:no joint deformities, effusion, or inflammation, no edema  NEURO: alert & oriented x 3 with fluent speech, no focal motor/sensory deficits  PERFORMANCE STATUS: ECOG 1  LABORATORY DATA: Lab Results  Component Value Date   WBC 6.3 01/22/2018   HGB 12.2 01/22/2018   HCT 36.9 01/22/2018   MCV 83.6 01/22/2018   PLT 314 01/22/2018      Chemistry      Component Value Date/Time   NA 140 01/22/2018 1326   K 3.8 01/22/2018 1326   CL 102 01/22/2018 1326  CO2 27 01/22/2018 1326   BUN 17 01/22/2018 1326   CREATININE 0.95 01/22/2018 1326      Component Value Date/Time   CALCIUM 9.3 01/22/2018 1326   ALKPHOS 92 01/22/2018 1326   AST 19 01/22/2018 1326   ALT 30 01/22/2018 1326   BILITOT 0.3 01/22/2018 1326       RADIOGRAPHIC STUDIES: Dg Chest 2 View  Result Date: 01/06/2018 CLINICAL DATA:  Status post left lower lobectomy. EXAM: CHEST - 2 VIEW COMPARISON:  01/05/2018 FINDINGS: The cardiomediastinal silhouette is unchanged with the heart size mildly accentuated by low lung volumes. A small left pleural effusion may be slightly larger. Minimal left  basilar atelectasis is noted. The right lung is clear. A tiny left apical pneumothorax is questioned. IMPRESSION: 1. Possible tiny left apical pneumothorax. 2. Small left pleural effusion. Electronically Signed   By: Logan Bores M.D.   On: 01/06/2018 09:36   Dg Chest 2 View  Result Date: 01/05/2018 CLINICAL DATA:  Chest tube removal EXAM: CHEST - 2 VIEW COMPARISON:  01/05/2018 FINDINGS: Grossly unchanged cardiac silhouette and mediastinal contours. Interval removal of left-sided chest tube.  No pneumothorax. Improved aeration of the left lung base with persistent left basilar opacities, likely atelectasis. There is minimal blunting of the left costophrenic angle. No definite pleural effusion. No evidence of edema. No acute osseus abnormalities. IMPRESSION: Uncomplicated removal of left-sided chest tube. Specifically, no pneumothorax. Electronically Signed   By: Sandi Mariscal M.D.   On: 01/05/2018 12:51   Dg Chest 2 View  Result Date: 01/04/2018 CLINICAL DATA:  Pneumothorax, chest tube, left lower lobectomy EXAM: CHEST - 2 VIEW COMPARISON:  Chest radiograph from one day prior. FINDINGS: Left chest tube is stable with the tip in the medial upper left pleural space. Stable cardiomediastinal silhouette with normal heart size. No convincing pneumothorax. No pleural effusion. Stable curvilinear opacities at the left lung base with volume loss in the left hemithorax. No pulmonary edema. No acute consolidative airspace disease. IMPRESSION: 1. Stable left chest tube with no convincing pneumothorax. 2. Stable curvilinear opacities at the left lung base, favor scarring or atelectasis. 3. Stable volume loss in the left hemithorax from left lower lobectomy. Electronically Signed   By: Ilona Sorrel M.D.   On: 01/04/2018 10:51   Dg Chest 2 View  Result Date: 01/03/2018 CLINICAL DATA:  Chest tube EXAM: CHEST - 2 VIEW COMPARISON:  01/02/2018 FINDINGS: Left-sided chest tube in satisfactory position. Possible tiny loculated  left basilar pneumothorax. Right lung is clear. No pleural effusion. Stable cardiomediastinal silhouette. No acute osseous abnormality. IMPRESSION: 1. Left-sided chest tube in satisfactory position. Possible tiny loculated left basilar pneumothorax. Electronically Signed   By: Kathreen Devoid   On: 01/03/2018 10:53   Dg Chest 2 View  Result Date: 01/02/2018 CLINICAL DATA:  Followup pneumothorax. EXAM: CHEST - 2 VIEW COMPARISON:  01/01/2018 FINDINGS: The cardiac silhouette, mediastinal and hilar contours are stable. The left-sided chest tube is stable. No left-sided pneumothorax is identified. Streaky areas of atelectasis bilaterally. No definite pleural effusion. IMPRESSION: Left-sided chest tube in stable position.  No residual pneumothorax. Streaky areas of bilateral atelectasis. Electronically Signed   By: Marijo Sanes M.D.   On: 01/02/2018 08:09   Dg Chest 2 View  Result Date: 12/27/2017 CLINICAL DATA:  Preoperative examination prior to left lower lobectomy for lung mass. Nonsmoker. EXAM: CHEST - 2 VIEW COMPARISON:  Chest CT scan of Nov 15, 2017 and chest x-ray of the same day FINDINGS: The lungs are adequately  inflated. There is persistent increased density in the left retrocardiac region. There is mild blunting of the posterior costophrenic angle. The right lung is clear. The heart and pulmonary vascularity are normal. The mediastinum is normal in width. The bony thorax is unremarkable. IMPRESSION: Persistent known mass in the left lower lobe. Otherwise no acute cardiopulmonary abnormality. Electronically Signed   By: David  Martinique M.D.   On: 12/27/2017 11:00   Dg Chest Port 1 View  Result Date: 01/05/2018 CLINICAL DATA:  Chest tube, pneumothorax EXAM: PORTABLE CHEST 1 VIEW COMPARISON:  01/04/2018 FINDINGS: Left chest tube remains in place. No visible pneumothorax. Left base atelectasis. Right lung clear. Heart is normal size. IMPRESSION: Left chest tube remains in place without visible pneumothorax.  Left base atelectasis. Electronically Signed   By: Rolm Baptise M.D.   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 EXAM: PORTABLE CHEST 1 VIEW COMPARISON:  Chest x-rays dated 12/31/2017 and 12/30/2017 and chest CT dated 11/15/2017 FINDINGS: One of the 2 left chest tubes has been removed. Very tiny left apical pneumothorax. Central line has been removed. No infiltrates or effusions. Minimal atelectasis at the left lung base. Heart size and vascularity are normal.  No bone abnormality. IMPRESSION: 1. Very tiny left apical pneumothorax after removal of 1 of the 2 left chest tubes. 2. Minimal left base atelectasis. Electronically Signed   By: Lorriane Shire M.D.   On: 01/01/2018 07:56   Dg Chest Port 1 View  Result Date: 12/31/2017 CLINICAL DATA:  Evaluate chest tube placement. EXAM: PORTABLE CHEST 1 VIEW COMPARISON:  12/30/2017. FINDINGS: Two left chest tubes are in place. No pneumothorax. Right IJ catheter tip is at the cavoatrial junction. No pleural effusion or edema identified. No focal pulmonary opacities. IMPRESSION: 1. Left chest tubes in place without pneumothorax. Electronically Signed   By: Kerby Moors M.D.   On: 12/31/2017 08:35   Dg Chest Port 1 View  Result Date: 12/30/2017 CLINICAL DATA:  Pneumothorax. Postop resection of left lower lobe mass. EXAM: PORTABLE CHEST 1 VIEW COMPARISON:  12/29/2017 FINDINGS: Two chest tubes on the left remain in place without pneumothorax. Central line in the SVC unchanged Bibasilar airspace disease left greater than right unchanged. No edema or effusion IMPRESSION: No active disease. Electronically Signed   By: Franchot Gallo M.D.   On: 12/30/2017 07:21   Dg Chest Port 1 View  Result Date: 12/29/2017 CLINICAL DATA:  32 year old female status post left lower lobectomy EXAM: PORTABLE CHEST 1 VIEW COMPARISON:  CT 11/15/2017, chest x-ray 12/27/2017 FINDINGS: Cardiomediastinal  silhouette likely unchanged, accentuated by low lung volumes. Interval placement of right IJ central venous catheter appearing to terminate superior vena cava. Interval placement of 2 left-sided thoracostomy tubes. No visualized pneumothorax. Ill-defined opacity in the retrocardiac region. IMPRESSION: Low lung volumes with left basilar atelectasis/surgical changes, status post left lower lobectomy. No evidence of left pneumothorax with to thoracostomy tubes in place. Right IJ central catheter, with no complicating features. Electronically Signed   By: Corrie Mckusick D.O.   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   PLAN: I had a lengthy discussion with the patient and her husband today about her current disease stage, prognosis and treatment options. I personally and independently reviewed her imaging studies. I think there was a an error in reporting of the pathology report indicating 11 R lymph node which is actually 11 L lymph node. I discussed with the patient the role of adjuvant systemic chemotherapy for patient with carcinoid tumor.  According to the NCCN guidelines there is no indication for adjuvant systemic chemotherapy or radiation for patient with resected stage I-III typical carcinoid tumor and the current standard of care is observation and close monitoring. I recommended for the patient to come back for follow-up visit in 6 months with repeat CT scan of the chest for restaging of her disease. She was advised to call immediately if she has any concerning symptoms in the interval. The patient voices understanding of current  disease status and treatment options and is in agreement with the current care plan.  All questions were answered. The patient knows to call the clinic with any problems, questions or concerns. We can certainly see the patient much sooner if necessary.  Thank you so much for allowing me to participate in the care of Patricia Glenn. I will continue to follow up the patient with you and assist in her care.  I spent 40 minutes counseling the patient face to face. The total time spent in the appointment was 60 minutes.  Disclaimer: This note was dictated with voice recognition software. Similar sounding words can inadvertently be transcribed and may not be corrected upon review.   Eilleen Kempf January 22, 2018, 2:33 PM

## 2018-01-22 NOTE — Progress Notes (Signed)
   HPI:  Patient returns for routine postoperative follow-up having undergone a video bronchoscopy,  left lower lobectomy with lymph node dissection, and placement of On Q by Dr. Servando Snare on 12/29/2017. 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 She was seen earlier today by Dr. Francie Massing from Oncology. Patient states she has been walking 3-4 miles and is not short of breath. She denies fever or chills, but states proximal portion of wound was oozing sero sanguinous fluid previously.   Current Outpatient Medications  Medication Sig Dispense Refill  . acetaminophen (TYLENOL) 500 MG tablet Take 2 tablets (1,000 mg total) by mouth every 6 (six) hours as needed for mild pain (pain). (Patient not taking: Reported on 01/22/2018) 30 tablet 0  . ibuprofen (ADVIL,MOTRIN) 800 MG tablet Take 1 tablet (800 mg total) by mouth every 8 (eight) hours as needed. (Patient not taking: Reported on 01/22/2018) 30 tablet 0  . OVER THE COUNTER MEDICATION lactation cookies    . PRENATAL 28-0.8 MG TABS Take 1 tablet by mouth daily.    Vital Signs: BP 150/85, HR 86, RR 20, Oxygenation 99% on room air   Physical Exam: CV-RRR Pulmonary-Clear to auscultation bilaterally Extremities-No LE edema Wounds-proximal portion of wound superficially dehisced and there is no drainage or sign of infection. 4 Nylon sutures removed and posterior chest tube wound dehisced secondary to "air knot" but no no drainage.  Diagnostic Tests: INICAL DATA:  32 year old female with a history of VATS and wedge resection 12/29/2017  EXAM: CHEST - 2 VIEW  COMPARISON:  01/06/2018  FINDINGS: Cardiomediastinal silhouette within normal limits.  No pneumothorax. No pleural effusion. Lateral view somewhat rotated limiting evaluation.  Opacity at the left lung base with partial obscuration left hemidiaphragm, compatible with postoperative changes.  No  displaced fracture.  IMPRESSION: Negative for acute cardiopulmonary disease.  Surgical changes of prior left lower lobectomy, with no visualized pneumothorax.   Electronically Signed   By: Corrie Mckusick D.O.   On: 01/22/2018 15:47  Impression and Plan: Overall, Patricia Glenn is recovering well from left lower lobectomy. She has not been taking any narcotics for pain so she was instructed she may begin driving short distances (I.e. 30 minutes or less during the day) and gradually increase the frequency and duration as tolerates. She states she and her family are driving to [place] in a few weeks. I recommended stopping every few hours and walk around. She inquired about going on rides and I told her she may still be sore but there is no absolute contraindication. She was instructed to place a dry band aid on the posterior chest tube wound and change it daily. If drainage or redness develops, she is to contact our office. Regarding very small superficial dehiscence or proximal left chest wound, I placed steri strips with benzoin. If drainage or redness develops she will contact the office. Patient states she has "white coat" hypertension so will not treat blood pressure based on this visit. She stated Dr. Elisabeth Cara office drew labs and she will see him in several months. Per Dr. Arvilla Market note, based on current guidelines, there is no indication for adjuvent systemic chemo or radiation.She will return to see Dr. Servando Snare in 4 weeks.     Patricia Skillern, PA-C [facility] [phone]

## 2018-01-24 ENCOUNTER — Telehealth: Payer: Self-pay | Admitting: Medical Oncology

## 2018-01-24 NOTE — Telephone Encounter (Signed)
Husband and pt asking if pt needs carcinoid specific tumor marker test. Per Julien Nordmann pt has no evidence of carcinoid -tumor was removed . Pt may f/u here or .

## 2018-01-25 ENCOUNTER — Other Ambulatory Visit: Payer: Self-pay | Admitting: *Deleted

## 2018-01-29 ENCOUNTER — Other Ambulatory Visit: Payer: Self-pay

## 2018-01-29 ENCOUNTER — Encounter: Payer: Self-pay | Admitting: Internal Medicine

## 2018-01-29 ENCOUNTER — Inpatient Hospital Stay: Payer: Managed Care, Other (non HMO)

## 2018-01-29 ENCOUNTER — Inpatient Hospital Stay: Payer: Managed Care, Other (non HMO) | Attending: Internal Medicine | Admitting: Internal Medicine

## 2018-01-29 VITALS — BP 122/86 | HR 103 | Temp 97.4°F | Resp 16 | Wt 214.0 lb

## 2018-01-29 DIAGNOSIS — C7A090 Malignant carcinoid tumor of the bronchus and lung: Secondary | ICD-10-CM | POA: Diagnosis present

## 2018-01-29 DIAGNOSIS — C7B09 Secondary carcinoid tumors of other sites: Principal | ICD-10-CM

## 2018-01-29 DIAGNOSIS — F419 Anxiety disorder, unspecified: Secondary | ICD-10-CM | POA: Diagnosis not present

## 2018-01-29 DIAGNOSIS — C7A00 Malignant carcinoid tumor of unspecified site: Secondary | ICD-10-CM

## 2018-01-29 NOTE — Progress Notes (Signed)
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  female with a new diagnosis of lung carcinoid status post lobectomy is here for follow-up.  Patient underwent lobectomy/thoracic lymph node dissection after delivering a baby.  She has recovered well from surgery.   Patient denies any loss of appetite.  Is very active.  Denies any nausea vomiting.  She has intermittent episodes of skin rash which she attributes to anxiety.  No diarrhea.  Review of Systems  Constitutional: Negative for chills, diaphoresis, fever, malaise/fatigue and weight loss.  HENT: Negative for nosebleeds and sore throat.   Eyes: Negative for double vision.  Respiratory: Negative for cough, hemoptysis, sputum production, shortness of breath and wheezing.   Cardiovascular: Negative for chest pain, palpitations, orthopnea and leg swelling.  Gastrointestinal: Negative for abdominal pain, blood in stool, constipation, diarrhea, heartburn, melena, nausea and vomiting.  Genitourinary: Negative for dysuria, frequency and urgency.  Musculoskeletal: Negative for back pain and joint pain.  Skin: Negative.  Negative for itching and rash.  Neurological: Negative for dizziness, tingling, focal weakness, weakness and headaches.  Endo/Heme/Allergies: Does not bruise/bleed easily.  Psychiatric/Behavioral: Negative for depression. The patient is not nervous/anxious and does not have insomnia.      MEDICAL HISTORY:  Past Medical History:  Diagnosis Date  . Encounter for induction of  labor 11/17/2017  . Encounter for supervision of other normal pregnancy, unspecified trimester 05/23/2017   31 y.o. G3P2002 at 36w1dby Patient's last menstrual period was 03/13/2017. Estimated Date of Delivery: 12/18/17  c/w 162w1dltrasound Sex of baby and name:  " "  Factors complicating this pregnancy   LLL pneumonia 24 weeks  Inpatient  Repeat CXR 3 weeks post _0   Elevated BP at NOB- watch closely; baseline labs ordered and WNL-05/23/17  Hx of LGA , previous babies weighed 4309 grams and 4139 gra  . 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 Lung mass   . Mass of lower lobe of left lung 11/17/2017  . Pneumonia complicating pregnancy, second trimester 08/29/2017    SURGICAL HISTORY: Past Surgical History:  Procedure Laterality Date  . FLEXIBLE BRONCHOSCOPY N/A 11/29/2017   Procedure: FLEXIBLE BRONCHOSCOPY;  Surgeon: SiWilhelmina McardleMD;  Location: [facility];  Service: Pulmonary;  Laterality: N/A;  . VIDEO ASSISTED THORACOSCOPY (VATS)/WEDGE RESECTION Left 12/29/2017   Procedure: Left VIDEO ASSISTED THORACOSCOPY with Mini Thoracotomy, Left Lower Lobe Lobectomy, Node Dissection,  Insertion of  ONQ pain pump;  Surgeon: GeGrace IsaacMD;  [place] [facility]: Thor[facility];  Laterality: Left;  . Marland KitchenIDEO BRONCHOSCOPY N/A 12/29/2017   Procedure: VIDEO BRONCHOSCOPY;  Surgeon: GeGrace IsaacMD;  [facility];  Service: Thoracic;  Laterality: N/A;    SOCIAL HISTORY: Social History   Socioeconomic History  . Marital status: Married    Spouse name: Not on file  . Number of children: Not on file  . Years of education: Not on file  . Highest education level: Not on file  Occupational History  .  Not on file  Social Needs  . Financial resource strain: Not on file  . Food insecurity:    Worry: Not on file    Inability: Not on file  . Transportation needs:    Medical: Not on file    Non-medical: Not on file  Tobacco Use  . Smoking status: Never Smoker  .  Smokeless tobacco: Never Used  Substance and Sexual Activity  . Alcohol use: Yes    Frequency: Never    Comment: once a month/year none recently d/t pregnancy  . Drug use: No  . Sexual activity: Not on file  Lifestyle  . Physical activity:    Days per week: Not on file    Minutes per session: Not on file  . Stress: Not on file  Relationships  . Social connections:    Talks on phone: Not on file    Gets together: Not on file    Attends religious service: Not on file    Active member of club or organization: Not on file    Attends meetings of clubs or organizations: Not on file    Relationship status: Not on file  . Intimate partner violence:    Fear of current or ex partner: Not on file    Emotionally abused: Not on file    Physically abused: Not on file    Forced sexual activity: Not on file  Other Topics Concern  . Not on file  Social History Narrative    stay home mom-; lives at home; 8 miles from here; no smoking; no second smoking; alcohol ocassional; 2 daughters.     FAMILY HISTORY:  Family History  Problem Relation Age of Onset  . Bladder Cancer Maternal Grandfather 55    ALLERGIES:  has No Known Allergies.  MEDICATIONS:  Current Outpatient Medications  Medication Sig Dispense Refill  . PRENATAL 28-0.8 MG TABS Take 1 tablet by mouth daily.     No current facility-administered medications for this visit.       Marland Kitchen  PHYSICAL EXAMINATION: ECOG PERFORMANCE STATUS: 0 - Asymptomatic  Vitals:   01/29/18 1453  BP: 122/86  Pulse: (!) 103  Resp: 16  Temp: (!) 97.4 F (36.3 C)   Filed Weights   01/29/18 1452  Weight: 214 lb (97.1 kg)    Physical Exam  Constitutional: She is oriented to person, place, and time and well-developed, well-nourished, and in no distress.  Accompanied by husband.  HENT:  Head: Normocephalic and atraumatic.  Mouth/Throat: Oropharynx is clear and moist. No oropharyngeal exudate.  Eyes: Pupils are equal, round, and reactive to  light.  Neck: Normal range of motion. Neck supple.  Cardiovascular: Normal rate and regular rhythm.  Pulmonary/Chest: No respiratory distress. She has no wheezes.  Abdominal: Soft. Bowel sounds are normal. She exhibits no mass. There is no tenderness. There is no rebound and no guarding.  Musculoskeletal: Normal range of motion. She exhibits no edema or tenderness.  Neurological: She is alert and oriented to person, place, and time.  Skin: Skin is warm.  Psychiatric: Affect normal.     LABORATORY DATA:  I have reviewed the data as listed Lab Results  Component Value Date   WBC 6.3 01/22/2018   HGB 12.2 01/22/2018   HCT 36.9 01/22/2018   MCV 83.6 01/22/2018   PLT 314 01/22/2018   Recent Labs    12/27/17 0909  12/31/17 0412 01/01/18 0234 01/22/18 1326  NA 138   < > 139 141 140  K 3.4*   < >  4.0 3.8 3.8  CL 107   < > 106 104 102  CO2 21*   < > _0 GLUCOSE 110*   < > 108* 92 92  BUN 10   < > _1 CREATININE 0.85   < > 0.86 0.77 0.95  CALCIUM 8.9   < > 8.1* 8.5* 9.3  GFRNONAA >60   < > >60 >60 >60  GFRAA >60   < > >60 >60 >60  PROT 7.1  --  5.3*  --  7.5  ALBUMIN 3.7  --  2.8*  --  4.2  AST 21  --  32  --  19  ALT 25  --  22  --  30  ALKPHOS 68  --  51  --  92  BILITOT 0.3  --  0.2*  --  0.3   < > = values in this interval not displayed.    RADIOGRAPHIC STUDIES: I have personally reviewed the radiological images as listed and agreed with the findings in the report. Dg Chest 2 View  Result Date: 01/22/2018 CLINICAL DATA:  32 year old female with a history of VATS and wedge resection 12/29/2017 EXAM: CHEST - 2 VIEW COMPARISON:  01/06/2018 FINDINGS: Cardiomediastinal silhouette within normal limits. No pneumothorax. No pleural effusion. Lateral view somewhat rotated limiting evaluation. Opacity at the left lung base with partial obscuration left hemidiaphragm, compatible with postoperative changes. No displaced fracture. IMPRESSION: Negative for acute  cardiopulmonary disease. Surgical changes of prior left lower lobectomy, with no visualized pneumothorax. Electronically Signed   By: Corrie Mckusick D.O.   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  #Tumor appears to be nonfunctional; however will get a baseline urine 5-HIAA 24 hours; check ACTH and also chromogranin A levels.  #Discussed with pathologist-we will check Ki-67/mitotic rate.   # CT scan in 6 months- r; labs uirone/ATCH/ chromogranin- follow up TBD.   # 25 minutes face-to-face with the patient discussing the above plan of care; more than 50% of time spent on prognosis/ natural history; counseling and coordination.  Addendum: Patient's work-up including urine 5-HIAA/ACTH chromogranin A levels are normal.  Recommend repeating a CT scan in 6 months/follow-up few days later with chromogranin A levels.   All questions were answered. The patient knows to call the clinic with any problems, questions or concerns.    Cammie Sickle, MD 02/07/2018 5:51 PM

## 2018-01-29 NOTE — Assessment & Plan Note (Addendum)
#  Left lower lobe lung carcinoid- pT2pN1a; given the well-differentiated tumor no role for any adjuvant treatment.  #Tumor appears to be nonfunctional; however will get a baseline urine 5-HIAA 24 hours; check ACTH and also chromogranin A levels.  #Discussed with pathologist-we will check Ki-67/mitotic rate.   # CT scan in 6 months- r; labs uirone/ATCH/ chromogranin- follow up TBD.   # 25 minutes face-to-face with the patient discussing the above plan of care; more than 50% of time spent on prognosis/ natural history; counseling and coordination.  Addendum: Patient's work-up including urine 5-HIAA/ACTH chromogranin A levels are normal.  Recommend repeating a CT scan in 6 months/follow-up few days later with chromogranin A levels.

## 2018-01-30 LAB — FUNGUS CULTURE WITH STAIN

## 2018-01-30 LAB — ACTH: C206 ACTH: 24.9 pg/mL (ref 7.2–63.3)

## 2018-01-30 LAB — FUNGUS CULTURE RESULT

## 2018-01-30 LAB — CHROMOGRANIN A: Chromogranin A: 1 nmol/L (ref 0–5)

## 2018-01-30 LAB — FUNGAL ORGANISM REFLEX

## 2018-01-31 DIAGNOSIS — C7A090 Malignant carcinoid tumor of the bronchus and lung: Secondary | ICD-10-CM | POA: Diagnosis not present

## 2018-02-01 ENCOUNTER — Other Ambulatory Visit: Payer: Self-pay

## 2018-02-01 DIAGNOSIS — C7A00 Malignant carcinoid tumor of unspecified site: Secondary | ICD-10-CM

## 2018-02-01 DIAGNOSIS — C7B09 Secondary carcinoid tumors of other sites: Principal | ICD-10-CM

## 2018-02-06 LAB — 5 HIAA, QUANTITATIVE, URINE, 24 HOUR
5 HIAA UR: 2.8 mg/L
5-HIAA,QUANT.,24 HR URINE: 3.1 mg/(24.h) (ref 0.0–14.9)
TOTAL VOLUME: 1100

## 2018-02-07 ENCOUNTER — Telehealth: Payer: Self-pay | Admitting: Internal Medicine

## 2018-02-07 DIAGNOSIS — C7A00 Malignant carcinoid tumor of unspecified site: Secondary | ICD-10-CM

## 2018-02-07 DIAGNOSIS — C7B09 Secondary carcinoid tumors of other sites: Principal | ICD-10-CM

## 2018-02-07 NOTE — Telephone Encounter (Signed)
Patricia Glenn- Please inform that patient's work-up including urine 5-HIAA/ACTH chromogranin A levels are normal. Also inform pt that Ki-67% was 2%- which is typical of well differentiated carcinoid.    # Recommend repeating a CT scan [I ordered] in 6 months/follow-up few days later with chromogranin A levels [please order]

## 2018-02-08 NOTE — Addendum Note (Signed)
Addended by: Telford Nab on: 02/08/2018 09:47 AM   Modules accepted: Orders

## 2018-02-08 NOTE — Telephone Encounter (Signed)
CT chest scheduled on 2/21 with chromogranin A levels to be drawn same day as CT scan. Pt scheduled to follow up for CT and lab results on 2/24. Pt made aware of appts and results from recent lab work. Nothing further needed at this time.

## 2018-02-12 ENCOUNTER — Encounter: Payer: Self-pay | Admitting: *Deleted

## 2018-02-12 ENCOUNTER — Telehealth: Payer: Self-pay | Admitting: *Deleted

## 2018-02-12 NOTE — Progress Notes (Signed)
Patient called back. She would like appts with Dr. Julien Nordmann cancelled. She is seeing Med Onc at [facility].  appts cancelled and I will update Dr. Julien Nordmann.

## 2018-02-12 NOTE — Telephone Encounter (Signed)
Oncology Nurse Navigator Documentation  Oncology Nurse Navigator Flowsheets 02/12/2018  Navigator Location [facility]  Navigator Encounter Type Telephone/I followed up on patient's schedule.  She has appt here and [facility] with medical oncology.  I called to get clarification.  I was unable to reach her but did leave vm message with my name and phone number to call.   Telephone Outgoing Call  Barriers/Navigation Needs Coordination of Care  Interventions Coordination of Care  Coordination of Care Other  Acuity Level 1  Time Spent with Patient 30

## 2018-02-21 LAB — ACID FAST CULTURE WITH REFLEXED SENSITIVITIES (MYCOBACTERIA): Acid Fast Culture: NEGATIVE

## 2018-02-28 ENCOUNTER — Other Ambulatory Visit: Payer: Self-pay | Admitting: Cardiothoracic Surgery

## 2018-02-28 DIAGNOSIS — C7A00 Malignant carcinoid tumor of unspecified site: Secondary | ICD-10-CM

## 2018-02-28 DIAGNOSIS — C7B09 Secondary carcinoid tumors of other sites: Principal | ICD-10-CM

## 2018-03-01 ENCOUNTER — Ambulatory Visit (INDEPENDENT_AMBULATORY_CARE_PROVIDER_SITE_OTHER): Payer: Self-pay | Admitting: Cardiothoracic Surgery

## 2018-03-01 ENCOUNTER — Ambulatory Visit
Admission: RE | Admit: 2018-03-01 | Discharge: 2018-03-01 | Disposition: A | Discharge status: Home / Self Care | Payer: Managed Care, Other (non HMO) | Source: Ambulatory Visit | Attending: Cardiothoracic Surgery | Admitting: Cardiothoracic Surgery

## 2018-03-01 VITALS — BP 130/82 | HR 98 | Resp 20 | Ht 67.0 in | Wt 219.0 lb

## 2018-03-01 DIAGNOSIS — C7B09 Secondary carcinoid tumors of other sites: Principal | ICD-10-CM

## 2018-03-01 DIAGNOSIS — Z902 Acquired absence of lung [part of]: Secondary | ICD-10-CM

## 2018-03-01 DIAGNOSIS — C7A00 Malignant carcinoid tumor of unspecified site: Secondary | ICD-10-CM

## 2018-03-01 DIAGNOSIS — R918 Other nonspecific abnormal finding of lung field: Secondary | ICD-10-CM

## 2018-03-01 NOTE — Progress Notes (Signed)
[address]       [place] [address]             [phone]                  Melyna R Corcovado Medical Record #[id number] Date of Birth: 03/14/1986  Referring BD:[id number], Christia Reading, MD Primary Cardiology: Primary Care:Patient, No Pcp Per  Chief Complaint:  Follow Up Visit 12/29/2017  PROCEDURE PERFORMED:  Video bronchoscopy, left lower lobectomy with lymph node dissection and placement of On-Q.  SURGEON:  Lanelle Bal, MD    Diagnosis 1. Lymph node, biopsy, Left 8 - THERE IS NO EVIDENCE OF MALIGNANCY IN 1 OF 1 LYMPH NODE (0/1). 2. Lymph node, biopsy, Left 8 #2 - THERE IS NO EVIDENCE OF MALIGNANCY IN 1 OF 1 LYMPH NODE (0/1). 3. 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 4. Lung, resection (segmental or lobe), LLL - CARCINOID TUMOR, 4.5 CM. - PERINEURAL INVASION IS IDENTIFIED. - LYMPHOVASCULAR INVASION IS IDENTIFIED. - THE SURGICAL RESECTION MARGINS ARE NEGATIVE FOR TUMOR. - SEE ONCOLOGY TABLE BELOW. 5. Lymph node, biopsy, 11 L - METASTATIC CARCINOID TUMOR IN 1 OF 1 LYMPH NODE (1/1). 1 of 4 Amended copy Corrected FINAL for [facility], [place] [phone]) Diagnosis(continued) 6. 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 7. Lymph node, biopsy, #3, left - THERE IS NO EVIDENCE OF MALIGNANCY IN 1 OF 1 LYMPH NODE (0/1). 8. Lymph node, biopsy, 10 L - THERE IS NO EVIDENCE OF MALIGNANCY IN 1 OF 1 LYMPH NODE (0/1). 9. Lymph node, biopsy, 11 L - BENIGN FIBROADIPOSE TISSUE. - LYMPH NODAL TISSUE IS NOT IDENTIFIED. - THERE IS NO EVIDENCE OF MALIGNANCY.  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  Patient has been seen by oncology both at [place], some hormonal blood work was recommended but neither  recommended chemotherapy.  Patient is now under schedule of q. 64-month CT scans of the chest.  She is making good progress postoperatively returning to exercising and caring for her 3 children one who was born just before her lung resection.        Zubrod Score: At the time of surgery this patient's most appropriate activity status/level should be described as: [x]     0    Normal activity, no symptoms []     1    Restricted in physical strenuous activity but ambulatory, able to do out light work []     2    Ambulatory and capable of self care, unable to do work activities, up and about                 >50 % of waking hours                                                                                   []     3    Only limited self care,  in bed greater than 50% of waking hours []     4    Completely disabled, no self care, confined to bed or chair []     5    Moribund  Social History   Tobacco Use  Smoking Status Never Smoker  Smokeless Tobacco Never Used       No Known Allergies  Current Outpatient Medications  Medication Sig Dispense Refill  . PRENATAL 28-0.8 MG TABS Take 1 tablet by mouth daily.     No current facility-administered medications for this visit.        Physical Exam: BP 130/82   Pulse 98   Resp 20   Ht 5\' 7"  (1.702 m)   Wt 219 lb (99.3 kg)   SpO2 98% Comment: RA  BMI 34.30 kg/m   General appearance: alert, cooperative and no distress Neurologic: intact Heart: regular rate and rhythm, S1, S2 normal, no murmur, click, rub or gallop Lungs: clear to auscultation bilaterally Abdomen: soft, non-tender; bowel sounds normal; no masses,  no organomegaly Extremities: extremities normal, atraumatic, no cyanosis or edema and Homans sign is negative, no sign of DVT Wound: Chest incision chest tube sites are well-healed   Diagnostic Studies & Laboratory data:         Recent Radiology Findings: Dg Chest 2 View  Result Date: 03/01/2018 CLINICAL DATA:  Pt  c/o carcinoid tumor metastatic to intrathoracic lymph node. Pt has no complaints. Hx of VATS 12/29/17, pneumonia. EXAM: CHEST - 2 VIEW COMPARISON:  01/22/2018 FINDINGS: Stable elevation of LEFT hemidiaphragm. Heart size is normal. Lungs are clear. IMPRESSION: No active cardiopulmonary disease. Electronically Signed   By: Nolon Nations M.D.   On: 03/01/2018 12:52      I have independently reviewed the above radiology findings and reviewed findings  with the patient.  Recent Labs: Lab Results  Component Value Date   WBC 6.3 01/22/2018   HGB 12.2 01/22/2018   HCT 36.9 01/22/2018   PLT 314 01/22/2018   GLUCOSE 92 01/22/2018   ALT 30 01/22/2018   AST 19 01/22/2018   NA 140 01/22/2018   K 3.8 01/22/2018   CL 102 01/22/2018   CREATININE 0.95 01/22/2018   BUN 17 01/22/2018   CO2 27 01/22/2018   INR 0.96 12/27/2017      Assessment / Plan:   Patient doing well following recent left lower lobectomy for carcinoid tumor metastatic to one lymph node.  She is currently under CT surveillance every 6 months arranged through oncology at [facility].  She is to have a CT scan in late February.  I will plan to see her back in April 2020.   Grace Isaac 03/01/2018 1:48 PM

## 2018-04-18 ENCOUNTER — Telehealth: Payer: Self-pay | Admitting: *Deleted

## 2018-04-18 NOTE — Telephone Encounter (Signed)
Pt's husband called in to request if could move CT and follow up to December. States that his insurance deductible will increase next year and they would like to get one more scan performed before the end of the year since has met out-of-pocket max for this year. Pt is also having concerns and would like to not wait to Feb for repeat scan if possible. Per Dr. Jacinto Reap, okay to move scan to third week of December and to follow up 1-2 days after scan. Message sent to scheduling to make appointment changes. Pt's husband made aware and stated will look on pt's MyChart for new appts. Instructed to call back if has any further questions or needs.

## 2018-05-31 ENCOUNTER — Telehealth: Payer: Self-pay | Admitting: *Deleted

## 2018-05-31 NOTE — Telephone Encounter (Signed)
Pt's husband called in to report that pt has been extremely anxious about cancer recurring and he wanted some advice to reassure her. States that she has developed the following symptoms which he attributes to stress: hair loss, acne, diarrhea, upper back pain, elevated BP and HR. Informed pt's husband that CT chest next week will look for any local recurrence and pt will be getting labs checked to check hormone levels. Informed that prognosis for carcinoid of the lung is relatively good after surgery. Pt's husband verbalized understanding and stated will let his wife know.

## 2018-05-31 NOTE — Telephone Encounter (Signed)
Thanks for the update

## 2018-06-01 ENCOUNTER — Inpatient Hospital Stay: Payer: Managed Care, Other (non HMO) | Attending: Internal Medicine

## 2018-06-01 DIAGNOSIS — C7B09 Secondary carcinoid tumors of other sites: Secondary | ICD-10-CM | POA: Diagnosis not present

## 2018-06-01 DIAGNOSIS — L659 Nonscarring hair loss, unspecified: Secondary | ICD-10-CM | POA: Insufficient documentation

## 2018-06-01 DIAGNOSIS — R232 Flushing: Secondary | ICD-10-CM | POA: Diagnosis not present

## 2018-06-01 DIAGNOSIS — Z902 Acquired absence of lung [part of]: Secondary | ICD-10-CM | POA: Insufficient documentation

## 2018-06-01 DIAGNOSIS — C7A00 Malignant carcinoid tumor of unspecified site: Secondary | ICD-10-CM | POA: Diagnosis present

## 2018-06-01 DIAGNOSIS — L680 Hirsutism: Secondary | ICD-10-CM | POA: Diagnosis not present

## 2018-06-05 ENCOUNTER — Encounter: Payer: Self-pay | Admitting: Radiology

## 2018-06-05 ENCOUNTER — Ambulatory Visit
Admission: RE | Admit: 2018-06-05 | Discharge: 2018-06-05 | Disposition: A | Discharge status: Home / Self Care | Payer: Managed Care, Other (non HMO) | Source: Ambulatory Visit | Attending: Internal Medicine | Admitting: Internal Medicine

## 2018-06-05 DIAGNOSIS — C7B09 Secondary carcinoid tumors of other sites: Secondary | ICD-10-CM | POA: Diagnosis present

## 2018-06-05 DIAGNOSIS — C7A00 Malignant carcinoid tumor of unspecified site: Secondary | ICD-10-CM | POA: Diagnosis present

## 2018-06-05 HISTORY — DX: Malignant carcinoid tumor of unspecified site: C7A.00

## 2018-06-05 HISTORY — DX: Secondary carcinoid tumors of other sites: C7B.09

## 2018-06-05 LAB — CHROMOGRANIN A

## 2018-06-05 MED ORDER — IOPAMIDOL (ISOVUE-300) INJECTION 61%
75.0000 mL | Freq: Once | INTRAVENOUS | Status: AC | PRN
  Administered 2018-06-05: 75 mL via INTRAVENOUS

## 2018-06-06 ENCOUNTER — Inpatient Hospital Stay (HOSPITAL_BASED_OUTPATIENT_CLINIC_OR_DEPARTMENT_OTHER): Payer: Managed Care, Other (non HMO) | Admitting: Internal Medicine

## 2018-06-06 ENCOUNTER — Other Ambulatory Visit: Payer: Managed Care, Other (non HMO)

## 2018-06-06 ENCOUNTER — Encounter: Payer: Self-pay | Admitting: Internal Medicine

## 2018-06-06 VITALS — BP 128/87 | HR 98 | Temp 97.9°F | Resp 16 | Wt 226.0 lb

## 2018-06-06 DIAGNOSIS — Z902 Acquired absence of lung [part of]: Secondary | ICD-10-CM

## 2018-06-06 DIAGNOSIS — C7A00 Malignant carcinoid tumor of unspecified site: Secondary | ICD-10-CM | POA: Diagnosis not present

## 2018-06-06 DIAGNOSIS — L680 Hirsutism: Secondary | ICD-10-CM | POA: Diagnosis not present

## 2018-06-06 DIAGNOSIS — R232 Flushing: Secondary | ICD-10-CM

## 2018-06-06 DIAGNOSIS — C7B09 Secondary carcinoid tumors of other sites: Secondary | ICD-10-CM

## 2018-06-06 DIAGNOSIS — R000 Tachycardia, unspecified: Secondary | ICD-10-CM

## 2018-06-06 DIAGNOSIS — L659 Nonscarring hair loss, unspecified: Secondary | ICD-10-CM

## 2018-06-06 NOTE — Progress Notes (Signed)
[place] NOTE  Patient Care Team: Brita Romp Dionne Bucy, MD as PCP - General (Family Medicine) Telford Nab, RN as Registered Nurse  CHIEF COMPLAINTS/PURPOSE OF CONSULTATION:     Oncology History   # July 2019- CARCINOID [pT2pN1] s/p resection [Dr.Gerhardt; [place]]; CT left hilum to the left lung base measuring 3.3 x 4.7 x 3.3 cm; PNI/LVI-POSITIVE; NO adjuvant therapy.   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  female with a new diagnosis of lung carcinoid status post lobectomy is here for follow-up/review the results of her CT scan.  Patient has multiple complaints today-abnormal hair growth on the chin and the face.  Also complains of profuse hair loss.  Obviously she has been anxious.  Continues to have chronic flushing of her anterior chest.  This is chronic.  Exacerbated by stress.  No diarrhea no weight loss.  No nausea no vomiting no headaches.  Review of Systems  Constitutional: Negative for chills, diaphoresis, fever, malaise/fatigue and weight loss.  HENT: Negative for nosebleeds and sore throat.   Eyes: Negative for double vision.  Respiratory: Negative for cough, hemoptysis, sputum production, shortness of breath and wheezing.   Cardiovascular: Negative for chest pain, palpitations, orthopnea and leg swelling.  Gastrointestinal: Negative for abdominal pain, blood in stool, constipation, diarrhea, heartburn, melena, nausea and vomiting.  Genitourinary: Negative for dysuria, frequency and urgency.  Musculoskeletal: Negative for back pain and joint pain.  Skin: Positive for rash. Negative for itching.  Neurological: Negative for dizziness, tingling, focal weakness, weakness and headaches.  Endo/Heme/Allergies: Does not bruise/bleed easily.  Psychiatric/Behavioral: Negative for  depression. The patient is not nervous/anxious and does not have insomnia.      MEDICAL HISTORY:  Past Medical History:  Diagnosis Date  . 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  . Encounter for induction of labor 11/17/2017  . Encounter for supervision of other normal pregnancy, unspecified trimester 05/23/2017   31 y.o. G3P2002 at 6w1dby Patient's last menstrual period was 03/13/2017. Estimated Date of Delivery: 12/18/17  c/w [id number] Sex of baby and name:  " "  Factors complicating this pregnancy   LLL pneumonia 24 weeks  Inpatient  Repeat CXR 3 weeks post _2   Elevated BP at NOB- watch closely; baseline labs ordered and WNL-05/23/17  Hx of LGA , previous babies weighed 4309 grams and 4139 gra  . 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 Lung mass   . Mass of lower lobe of left lung 11/17/2017  . Pneumonia complicating pregnancy, second trimester 08/29/2017    SURGICAL HISTORY: Past Surgical History:  Procedure Laterality Date  . FLEXIBLE BRONCHOSCOPY N/A 11/29/2017   Procedure: FLEXIBLE BRONCHOSCOPY;  Surgeon: SiWilhelmina McardleMD;  Location: [facility] ORS;  Service: Pulmonary;  Laterality: N/A;  . VIDEO ASSISTED THORACOSCOPY (VATS)/WEDGE RESECTION Left 12/29/2017   Procedure: Left VIDEO ASSISTED THORACOSCOPY with Mini Thoracotomy, Left Lower Lobe Lobectomy, Node Dissection,  Insertion of  ONQ pain pump;  Surgeon: GeGrace IsaacMD;  [facility] Service: Thoracic;  Laterality: Left;  . Marland KitchenIDEO BRONCHOSCOPY N/A 12/29/2017   Procedure: VIDEO BRONCHOSCOPY;  Surgeon:  Grace Isaac, MD;  Location: [facility] OR;  Service: Thoracic;  Laterality: N/A;    SOCIAL HISTORY: Social History   Socioeconomic History  . Marital status: Married    Spouse name: Not on file  . Number of children: Not on file  . Years of education: Not on file  . Highest education  level: Not on file  Occupational History  . Not on file  Social Needs  . Financial resource strain: Not on file  . Food insecurity:    Worry: Not on file    Inability: Not on file  . Transportation needs:    Medical: Not on file    Non-medical: Not on file  Tobacco Use  . Smoking status: Never Smoker  . Smokeless tobacco: Never Used  Substance and Sexual Activity  . Alcohol use: Yes    Frequency: Never    Comment: once a month/year none recently d/t pregnancy  . Drug use: No  . Sexual activity: Not on file  Lifestyle  . Physical activity:    Days per week: Not on file    Minutes per session: Not on file  . Stress: Not on file  Relationships  . Social connections:    Talks on phone: Not on file    Gets together: Not on file    Attends religious service: Not on file    Active member of club or organization: Not on file    Attends meetings of clubs or organizations: Not on file    Relationship status: Not on file  . Intimate partner violence:    Fear of current or ex partner: Not on file    Emotionally abused: Not on file    Physically abused: Not on file    Forced sexual activity: Not on file  Other Topics Concern  . Not on file  Social History Narrative    stay home mom-; lives at home; 8 miles from here; no smoking; no second smoking; alcohol ocassional; 2 daughters.     FAMILY HISTORY:  Family History  Problem Relation Age of Onset  . Bladder Cancer Maternal Grandfather 10    ALLERGIES:  has No Known Allergies.  MEDICATIONS:  No current outpatient medications on file.   No current facility-administered medications for this visit.       Marland Kitchen  PHYSICAL EXAMINATION: ECOG PERFORMANCE STATUS: 0 - Asymptomatic  Vitals:   06/06/18 1012  BP: 128/87  Pulse: 98  Resp: 16  Temp: 97.9 F (36.6 C)   Filed Weights   06/06/18 1012  Weight: 226 lb (102.5 kg)    Physical Exam  Constitutional: She is oriented to person, place, and time and well-developed,  well-nourished, and in no distress.  Accompanied by husband.  HENT:  Head: Normocephalic and atraumatic.  Mouth/Throat: Oropharynx is clear and moist. No oropharyngeal exudate.  Eyes: Pupils are equal, round, and reactive to light.  Neck: Normal range of motion. Neck supple.  Cardiovascular: Normal rate and regular rhythm.  Pulmonary/Chest: No respiratory distress. She has no wheezes.  Abdominal: Soft. Bowel sounds are normal. She exhibits no mass. There is no abdominal tenderness. There is no rebound and no guarding.  Musculoskeletal: Normal range of motion.        General: No tenderness or edema.  Neurological: She is alert and oriented to person, place, and time.  Skin: Skin is warm.  Macular rash erythematous noted on the chest no warmth  Psychiatric: Affect normal.     LABORATORY DATA:  I  have reviewed the data as listed Lab Results  Component Value Date   WBC 6.3 01/22/2018   HGB 12.2 01/22/2018   HCT 36.9 01/22/2018   MCV 83.6 01/22/2018   PLT 314 01/22/2018   Recent Labs    12/27/17 0909  12/31/17 0412 01/01/18 0234 01/22/18 1326  NA 138   < > 139 141 140  K 3.4*   < > 4.0 3.8 3.8  CL 107   < > 106 104 102  CO2 21*   < > _0 GLUCOSE 110*   < > 108* 92 92  BUN 10   < > _1 CREATININE 0.85   < > 0.86 0.77 0.95  CALCIUM 8.9   < > 8.1* 8.5* 9.3  GFRNONAA >60   < > >60 >60 >60  GFRAA >60   < > >60 >60 >60  PROT 7.1  --  5.3*  --  7.5  ALBUMIN 3.7  --  2.8*  --  4.2  AST 21  --  32  --  19  ALT 25  --  22  --  30  ALKPHOS 68  --  51  --  92  BILITOT 0.3  --  0.2*  --  0.3   < > = values in this interval not displayed.    RADIOGRAPHIC STUDIES: I have personally reviewed the radiological images as listed and agreed with the findings in the report. Ct Chest W Contrast  Result Date: 06/05/2018 CLINICAL DATA:  Left lung carcinoid tumor. EXAM: CT CHEST WITH CONTRAST TECHNIQUE: Multidetector CT imaging of the chest was performed during intravenous  contrast administration. CONTRAST:  60m ISOVUE-300 IOPAMIDOL (ISOVUE-300) INJECTION 61% COMPARISON:  CT chest 11/15/2017.  PET-CT 12/22/2017. FINDINGS: Cardiovascular: The heart size is normal. No substantial pericardial effusion. Mediastinum/Nodes: No mediastinal lymphadenopathy. There is no hilar lymphadenopathy. The esophagus has normal imaging features. There is no axillary lymphadenopathy. Lungs/Pleura: The central tracheobronchial airways are patent. Volume loss left hemithorax compatible with prior left lower lobectomy. No suspicious pulmonary nodule or mass. No focal airspace consolidation. No pleural effusion. Upper Abdomen: Unremarkable. Musculoskeletal: No worrisome lytic or sclerotic osseous abnormality. IMPRESSION: 1. Status post interval left lower lobectomy. 2. No findings to suggest recurrent or metastatic disease in the chest. Electronically Signed   By: EMisty StanleyM.D.   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  given the well-differentiated tumor no role for any adjuvant treatment-chemoradiation.  Stable  # CT scan December 2019 chest-no evidence of recurrence.  Discussed regarding using a gallium PET scan for further evaluation-however hold off at this time as no concerns for recurrence at this time.  #Hirsutism-question etiology; PCO D versus others recommend endocrinology evaluation-especially given the patient's concerns for association of Cushing's with carcinoid of the lung.  I clinically doubt if patient has Cushing's.  Await endocrinology evaluation.  #Multiple moles chronic/flushing [since age of 89exacerbated alcohol]-question anxiety unrelated to carcinoid disease.  Recommend dermatology evaluation.  # Tachycadria- regular; 98; question anxiety no concerns for erythematous.  # DISPOSITION: # referral to  endocrinology-[facility] re: hirsutism # referral to dermataology- re: hair loss/ skin flushing # follow up in 6 months- CT scan-cbc/cmp/chromograninA; 5HIAA 24 hour- Dr.B   All questions were answered. The patient knows to call the clinic with any problems, questions or concerns.  Cammie Sickle, MD 06/06/2018 1:32 PM

## 2018-06-06 NOTE — Assessment & Plan Note (Addendum)
#  Left lower lobe lung carcinoid- pT2pN1a; nonfunctional [see discussion regarding rash].  given the well-differentiated tumor no role for any adjuvant treatment-chemoradiation.  Stable  # CT scan December 2019 chest-no evidence of recurrence.  Discussed regarding using a gallium PET scan for further evaluation-however hold off at this time as no concerns for recurrence at this time.  #Hirsutism-question etiology; PCO D versus others recommend endocrinology evaluation-especially given the patient's concerns for association of Cushing's with carcinoid of the lung.  I clinically doubt if patient has Cushing's.  Await endocrinology evaluation.  #Multiple moles chronic/flushing [since age of 70 exacerbated alcohol]-question anxiety unrelated to carcinoid disease.  Recommend dermatology evaluation.  # Tachycadria- regular; 98; question anxiety no concerns for arrhythmia  # DISPOSITION: # referral to endocrinology-[facility] re: hirsutism # referral to dermataology- re: hair loss/ skin flushing # follow up in 6 months- CT scan-cbc/cmp/chromograninA; 5HIAA 24 hour- Dr.B

## 2018-06-25 ENCOUNTER — Ambulatory Visit: Payer: Self-pay | Admitting: Physician Assistant

## 2018-07-19 DIAGNOSIS — D229 Melanocytic nevi, unspecified: Secondary | ICD-10-CM

## 2018-07-19 HISTORY — DX: Melanocytic nevi, unspecified: D22.9

## 2018-07-23 ENCOUNTER — Other Ambulatory Visit: Payer: Managed Care, Other (non HMO)

## 2018-07-25 ENCOUNTER — Ambulatory Visit: Payer: Managed Care, Other (non HMO) | Admitting: Internal Medicine

## 2018-08-10 ENCOUNTER — Other Ambulatory Visit: Payer: Managed Care, Other (non HMO)

## 2018-08-10 ENCOUNTER — Ambulatory Visit: Payer: Managed Care, Other (non HMO)

## 2018-08-13 ENCOUNTER — Other Ambulatory Visit: Payer: Managed Care, Other (non HMO)

## 2018-08-13 ENCOUNTER — Ambulatory Visit: Payer: Managed Care, Other (non HMO) | Admitting: Internal Medicine

## 2018-08-24 ENCOUNTER — Encounter: Payer: Self-pay | Admitting: Family Medicine

## 2018-08-24 ENCOUNTER — Ambulatory Visit (INDEPENDENT_AMBULATORY_CARE_PROVIDER_SITE_OTHER): Payer: Managed Care, Other (non HMO) | Admitting: Family Medicine

## 2018-08-24 VITALS — BP 146/107 | HR 100 | Temp 98.1°F | Ht 67.0 in | Wt 228.6 lb

## 2018-08-24 DIAGNOSIS — C7A00 Malignant carcinoid tumor of unspecified site: Secondary | ICD-10-CM

## 2018-08-24 DIAGNOSIS — L680 Hirsutism: Secondary | ICD-10-CM | POA: Diagnosis not present

## 2018-08-24 DIAGNOSIS — I10 Essential (primary) hypertension: Secondary | ICD-10-CM | POA: Insufficient documentation

## 2018-08-24 DIAGNOSIS — E669 Obesity, unspecified: Secondary | ICD-10-CM

## 2018-08-24 DIAGNOSIS — R030 Elevated blood-pressure reading, without diagnosis of hypertension: Secondary | ICD-10-CM | POA: Diagnosis not present

## 2018-08-24 DIAGNOSIS — C7B09 Secondary carcinoid tumors of other sites: Secondary | ICD-10-CM

## 2018-08-24 DIAGNOSIS — Z6835 Body mass index (BMI) 35.0-35.9, adult: Secondary | ICD-10-CM

## 2018-08-24 NOTE — Assessment & Plan Note (Signed)
Discussed diet and exercise 

## 2018-08-24 NOTE — Patient Instructions (Signed)
Blood Pressure Record Sheet To take your blood pressure, you will need a blood pressure machine. You can buy a blood pressure machine (blood pressure monitor) at your clinic, drug store, or online. When choosing one, consider:  An automatic monitor that has an arm cuff.  A cuff that wraps snugly around your upper arm. You should be able to fit only one finger between your arm and the cuff.  A device that stores blood pressure reading results.  Do not choose a monitor that measures your blood pressure from your wrist or finger. Follow your health care provider's instructions for how to take your blood pressure. To use this form:  Get one reading in the morning (a.m.) before you take any medicines.  Get one reading in the evening (p.m.) before supper.  Take at least 2 readings with each blood pressure check. This makes sure the results are correct. Wait 1-2 minutes between measurements.  Write down the results in the spaces on this form.  Repeat this once a week, or as told by your health care provider.  Make a follow-up appointment with your health care provider to discuss the results. Blood pressure log Date: _______________________  a.m. _____________________(1st reading) _____________________(2nd reading)  p.m. _____________________(1st reading) _____________________(2nd reading) Date: _______________________  a.m. _____________________(1st reading) _____________________(2nd reading)  p.m. _____________________(1st reading) _____________________(2nd reading) Date: _______________________  a.m. _____________________(1st reading) _____________________(2nd reading)  p.m. _____________________(1st reading) _____________________(2nd reading) Date: _______________________  a.m. _____________________(1st reading) _____________________(2nd reading)  p.m. _____________________(1st reading) _____________________(2nd reading) Date: _______________________  a.m.  _____________________(1st reading) _____________________(2nd reading)  p.m. _____________________(1st reading) _____________________(2nd reading) This information is not intended to replace advice given to you by your health care provider. Make sure you discuss any questions you have with your health care provider. Document Released: 03/05/2003 Document Revised: 06/06/2017 Document Reviewed: 06/06/2017 Elsevier Interactive Patient Education  2019 [employer]  

## 2018-08-24 NOTE — Assessment & Plan Note (Signed)
May be related to PCOS vs related to Carcinoid tumor Referral to endocrinology for further workup

## 2018-08-24 NOTE — Assessment & Plan Note (Signed)
S/p resection in 12/2017 Followed by Oncology Seems to be doing well without any sign of recurrence of CT, but patient remains concerned about other symptoms including hirsutism, BP elevation, flushing I also wonder if patient may have some anxiety (related to abrupt diagnosis with NET) that is contributing to flushing, elevated BP, etc Will be undergoing endocrine work-up as well

## 2018-08-24 NOTE — Progress Notes (Signed)
Patient: Patricia Glenn, Female    DOB: August 29, 1985, 33 y.o.   MRN: [id number] Visit Date: 08/24/2018  Today's Provider: Lavon Paganini, MD   Chief Complaint  Patient presents with  . Establish Care   Subjective:    I, Tiburcio Pea, CMA, am acting as a scribe for Lavon Paganini, MD.    New Patient: Patricia Glenn is a 33 y.o. female who presents today to Establish Care.  She feels fairly well. She reports exercising daily. She reports she is sleeping fairly well.  Patient was noted to have lung mass in 10/2017 after developing hemoptysis while pregnant.  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  She underwent lobectomy.  She is followed by CVTS and Oncology.  At recent appt in 05/2018 with Oncology,  It was discussed with the patient that CT chest revealed no evidence of recurrence.  Decision was made to hold off on PET scan at this time.  Patient remains concerned about chronic flushing, acne, and hirsutism.  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    She was seen by [facility] (Dr [facility]) and started on benzaclin, retin-A, doxycycline for acne.  She also had a dysplastic nevi removed.  She does not yet have an appointment with Endocrinology (referred by Oncology). -----------------------------------------------------------------   Review of Systems  Constitutional: Negative.   HENT: Negative.   Eyes: Positive for redness.  Respiratory: Negative.   Cardiovascular: Positive for chest pain.  Gastrointestinal: Positive for diarrhea.  Endocrine: Negative.   Genitourinary: Negative.   Musculoskeletal: Negative.   Skin: Negative.   Allergic/Immunologic: Negative.   Neurological: Positive for numbness and headaches.  Hematological: Negative.   Psychiatric/Behavioral: Negative.     Social History      She  reports that she has never smoked. She has never used smokeless tobacco. She reports  current alcohol use. She reports that she does not use drugs.       Social History   Socioeconomic History  . Marital status: Married    Spouse name: Not on file  . Number of children: Not on file  . Years of education: Not on file  . Highest education level: Not on file  Occupational History  . Not on file  Social Needs  . Financial resource strain: Not on file  . Food insecurity:    Worry: Not on file    Inability: Not on file  . Transportation needs:    Medical: Not on file    Non-medical: Not on file  Tobacco Use  . Smoking status: Never Smoker  . Smokeless tobacco: Never Used  Substance and Sexual Activity  . Alcohol use: Yes    Frequency: Never    Comment: occasional  . Drug use: No  . Sexual activity: Not on file  Lifestyle  . Physical activity:    Days per week: Not on file    Minutes per session: Not on file  . Stress: Not on file  Relationships  . Social connections:    Talks on phone: Not on file    Gets together: Not on file    Attends religious service: Not on file    Active member of club or organization: Not on file    Attends meetings of clubs or organizations: Not on file    Relationship status: Not on file  Other Topics Concern  . Not on file  Social History  Narrative    stay home mom-; lives at home; 8 miles from here; no smoking; no second smoking; alcohol ocassional; 2 daughters.     Past Medical History:  Diagnosis Date  . 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  . Encounter for induction of labor 11/17/2017  . Encounter for supervision of other normal pregnancy, unspecified trimester 05/23/2017   33 y.o. G3P2002 at 64w1dby Patient's last menstrual period was 03/13/2017. Estimated Date of Delivery: 12/18/17  c/w 11w1dltrasound Sex of baby and name:  " "  Factors complicating this pregnancy   LLL pneumonia 24 weeks  Inpatient  Repeat CXR 3 weeks  post _0   Elevated BP at NOB- watch closely; baseline labs ordered and WNL-05/23/17  Hx of LGA , previous babies weighed 4309 grams and 4139 gra  . 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 Lung mass   . Mass of lower lobe of left lung 11/17/2017  . Pneumonia complicating pregnancy, second trimester 08/29/2017     Patient Active Problem List   Diagnosis Date Noted  . Elevated BP without diagnosis of hypertension 08/24/2018  . Hirsutism 08/24/2018  . Obesity 08/24/2018  . 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    Past Surgical History:  Procedure Laterality Date  . FLEXIBLE BRONCHOSCOPY N/A 11/29/2017   Procedure: FLEXIBLE BRONCHOSCOPY;  Surgeon: SiWilhelmina McardleMD;  Location: [facility] ORS;  Service: Pulmonary;  Laterality: N/A;  . VIDEO ASSISTED THORACOSCOPY (VATS)/WEDGE RESECTION Left 12/29/2017   Procedure: Left VIDEO ASSISTED THORACOSCOPY with Mini Thoracotomy, Left Lower Lobe Lobectomy, Node Dissection,  Insertion of  ONQ pain pump;  Surgeon: GeGrace IsaacMD;  [facility] Service: Thoracic;  Laterality: Left;  . Marland KitchenIDEO BRONCHOSCOPY N/A 12/29/2017   Procedure: VIDEO BRONCHOSCOPY;  Surgeon: GeGrace IsaacMD;  [facility];  Service: Thoracic;  Laterality: N/A;    Family History        Family Status  Relation Name Status  . MGF  Alive  . Father  (Not Specified)  . MGM  (Not Specified)  . PGM  Deceased  . PGF  Deceased  . Neg Hx  (Not Specified)        Her family history includes Bladder Cancer (age of onset: 8785in her paternal grandfather; Diabetes in her maternal grandmother; Heart disease (age of onset: 5044in her paternal grandmother; Parkinson's disease in her maternal grandmother; Prostate cancer (age of onset: 6255in her father. There is no history of Breast cancer, Ovarian cancer, or Colon cancer.      No Known Allergies   Current Outpatient Medications:  .  clindamycin-benzoyl peroxide  (BENZACLIN) gel, APPLY A THIN LAYER TO FACE IN THE MORNING. WASH OFF AT BEDTIME, Disp: , Rfl:  .  doxycycline (PERIOSTAT) 20 MG tablet, Take 20 mg by mouth 2 (two) times daily with a meal., Disp: , Rfl:  .  tretinoin (RETIN-A) 0.025 % cream, APPLY TOPICALLY ON THE SKIN AS DIRECTED AT BEDTIME AS TOLERATED., Disp: , Rfl:    Patient Care Team: BaVirginia CrewsMD as PCP - General (Family Medicine) RhTelford NabRN as Registered Nurse    Objective:    Vitals: BP (!) 146/107 (BP Location: Right Arm, Patient Position: Sitting, Cuff Size: Large)   Pulse 100   Temp 98.1 F (36.7 C) (Oral)   Ht  _0  (1.702 m)   Wt 228 lb 9.6 oz (103.7 kg)   SpO2 98%   BMI 35.80 kg/m    Vitals:   08/24/18 1111 08/24/18 1156  BP: (!) 152/109 (!) 146/107  Pulse: 100   Temp: 98.1 F (36.7 C)   TempSrc: Oral   SpO2: 98%   Weight: 228 lb 9.6 oz (103.7 kg)   Height: _1  (1.702 m)      Physical Exam Vitals signs reviewed.  Constitutional:      General: She is not in acute distress.    Appearance: Normal appearance. She is well-developed. She is not diaphoretic.  HENT:     Head: Normocephalic and atraumatic.     Right Ear: Tympanic membrane, ear canal and external ear normal.     Left Ear: Tympanic membrane, ear canal and external ear normal.     Nose: Nose normal.     Mouth/Throat:     Mouth: Mucous membranes are moist.     Pharynx: Oropharynx is clear. No oropharyngeal exudate.  Eyes:     General: No scleral icterus.    Conjunctiva/sclera: Conjunctivae normal.     Pupils: Pupils are equal, round, and reactive to light.  Neck:     Musculoskeletal: Neck supple.     Thyroid: No thyromegaly.  Cardiovascular:     Rate and Rhythm: Normal rate and regular rhythm.     Pulses: Normal pulses.     Heart sounds: Normal heart sounds. No murmur.  Pulmonary:     Effort: Pulmonary effort is normal. No respiratory distress.     Breath sounds: Normal breath sounds. No wheezing or rales.  Abdominal:      General: Bowel sounds are normal. There is no distension.     Palpations: Abdomen is soft.     Tenderness: There is no abdominal tenderness. There is no guarding or rebound.  Musculoskeletal:     Right lower leg: No edema.     Left lower leg: No edema.  Lymphadenopathy:     Cervical: No cervical adenopathy.  Skin:    General: Skin is warm and dry.     Capillary Refill: Capillary refill takes less than 2 seconds.     Findings: No rash.  Neurological:     Mental Status: She is alert and oriented to person, place, and time. Mental status is at baseline.  Psychiatric:        Mood and Affect: Mood normal.        Behavior: Behavior normal.        Thought Content: Thought content normal.      Depression Screen PHQ 2/9 Scores 08/24/2018  PHQ - 2 Score 1  PHQ- 9 Score 3       Assessment & Plan:     Establish Care  Exercise Activities and Dietary recommendations Goals   None     Immunization History  Administered Date(s) Administered  . Influenza Inj Mdck Quad With Preservative 06/21/2017  . Influenza, Quadrivalent, Recombinant, Inj, Pf 05/01/2018  . Tdap 09/02/2014, 10/03/2017    Health Maintenance  Topic Date Due  . PAP SMEAR-Modifier  05/25/2020  . TETANUS/TDAP  10/04/2027  . INFLUENZA VACCINE  Completed  . HIV Screening  Completed     Discussed health benefits of physical activity, and encouraged her to engage in regular exercise appropriate for her age and condition.    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 ([place]) - Primary    S/p resection in 12/2017 Followed by Oncology Seems to be doing  well without any sign of recurrence of CT, but patient remains concerned about other symptoms including hirsutism, BP elevation, flushing I also wonder if patient may have some anxiety (related to abrupt diagnosis with NET) that is contributing to flushing, elevated BP, etc Will be undergoing endocrine work-up as well      Relevant Medications   doxycycline (PERIOSTAT) 20 MG tablet   Other Relevant Orders   Ambulatory referral to Endocrinology     Other   Elevated BP without diagnosis of hypertension    Elevated today Suspect that anxiety/white coat HTN may be contributing Reviewed recent labs Advised to monitor home BPs Will recheck at next visit      Obesity    Discussed diet and exercise          Return in about 6 months (around 02/24/2019) for CPE.   The entirety of the information documented in the History of Present Illness, Review of Systems and Physical Exam were personally obtained by me. Portions of this information were initially documented by Tiburcio Pea, CMA and reviewed by me for thoroughness and accuracy.    Virginia Crews, MD, MPH [facility] 08/24/2018 1:04 PM

## 2018-08-24 NOTE — Assessment & Plan Note (Signed)
Elevated today Suspect that anxiety/white coat HTN may be contributing Reviewed recent labs Advised to monitor home BPs Will recheck at next visit

## 2018-08-27 ENCOUNTER — Telehealth: Payer: Self-pay | Admitting: Family Medicine

## 2018-08-27 DIAGNOSIS — H9190 Unspecified hearing loss, unspecified ear: Secondary | ICD-10-CM

## 2018-08-27 DIAGNOSIS — H7293 Unspecified perforation of tympanic membrane, bilateral: Secondary | ICD-10-CM

## 2018-08-27 NOTE — Telephone Encounter (Signed)
This is usually due to ear infection.  They should slowly heal over time.  The antibiotics are important.  Can go ahead with ENT referral or can f/u in 2 weeks so we can see if they are healing.  Any hearing loss?  This would prompt sooner ENT referral

## 2018-08-27 NOTE — Telephone Encounter (Signed)
Both ear drums ruptured - Urgent Care and is now taking antibiotics. Leaking fluid.  Pt wants to know what Dr. B wants her to do.  Thanks, [employer]

## 2018-08-27 NOTE — Telephone Encounter (Signed)
Patient advised. She states she is still experiencing ear drainage and difficulty hearing. ENT referral placed.

## 2018-08-28 ENCOUNTER — Telehealth: Payer: Self-pay | Admitting: Family Medicine

## 2018-08-28 NOTE — Telephone Encounter (Signed)
Pt states she is having sharp pain in both ears and also some hearing loss.She wonders if she should go to emergency room today.Referral to ENT was sent today marked urgent

## 2018-08-28 NOTE — Telephone Encounter (Signed)
Discussed with Patricia Glenn. Patient advised per Fabio Bering it will take time for the ruptured ear drums to heal and her hearing to return. Offered patient a appointment to come in tomorrow to check to see if she was hurts. She declined at this time and states she will give it more time.

## 2018-08-28 NOTE — Telephone Encounter (Signed)
Patient scheduled for 08/29/2018

## 2018-08-28 NOTE — Telephone Encounter (Signed)
She should make appointment to see Dr. B in the morning.

## 2018-08-28 NOTE — Telephone Encounter (Signed)
Pt got ENT referral visit for her ruptured eardrums on Mon, 10-04-18.  She has the appt but is asking what Dr. B would suggest for her to do in the meantime since she cannot hear and is having the oozing liquid coming out.  Please advise.  Thanks,  [employer]

## 2018-08-29 ENCOUNTER — Encounter: Payer: Self-pay | Admitting: Family Medicine

## 2018-08-29 ENCOUNTER — Ambulatory Visit (INDEPENDENT_AMBULATORY_CARE_PROVIDER_SITE_OTHER): Payer: Managed Care, Other (non HMO) | Admitting: Family Medicine

## 2018-08-29 VITALS — BP 148/100 | HR 93 | Temp 98.4°F | Wt 226.4 lb

## 2018-08-29 DIAGNOSIS — H66012 Acute suppurative otitis media with spontaneous rupture of ear drum, left ear: Secondary | ICD-10-CM | POA: Diagnosis not present

## 2018-08-29 DIAGNOSIS — H60501 Unspecified acute noninfective otitis externa, right ear: Secondary | ICD-10-CM | POA: Diagnosis not present

## 2018-08-29 MED ORDER — NEOMYCIN-POLYMYXIN-HC 3.5-10000-1 OT SUSP: 3.0000 [drp] | Freq: Four times a day (QID) | OTIC | 0 refills | Status: DC

## 2018-08-29 NOTE — Patient Instructions (Signed)
Eardrum Rupture, Adult  An eardrum rupture is a hole (perforation) in the eardrum. The eardrum is a thin, round tissue inside of the ear that separates the ear canal from the middle ear. The eardrum is also called the tympanic membrane. It transfers sound vibrations through small bones in the middle ear to the hearing nerve in the inner ear. It also protects the middle ear from germs. An eardrum rupture can cause pain and hearing loss. What are the causes? This condition may be caused by:  An infection.  A sudden injury, such as from: ? Inserting a thin, sharp object into the ear. ? A hit to the side of the head, especially by an open hand. ? Falling onto water or a flat surface. ? A rapid change in pressure, such as from flying or scuba diving. ? A sudden increase in pressure against the eardrum, such as from an explosion or a very loud noise.  Inserting a cotton-tipped swab in the ear.  A long-term eustachian tube disorder. Eustachian tubes are parts of the body that connect each middle ear space to the back of the nose.  A medical procedure or surgery, such as a procedure to remove wax from the ear canal.  Removing a man-made pressure equalization tube(PE tube) that was placed through the eardrum.  Having a PE tube fall out. What increases the risk? You are more likely to develop this condition if:  You have had PE tubes inserted in your ears.  You have an ear infection.  You play sports that: ? Involve balls or contact with other players. ? Take place in water, such as diving, scuba diving, or waterskiing. What are the signs or symptoms? Symptoms of this condition include:  Sudden pain at the time of the injury.  Ear pain that suddenly improves.  Ringing in the ear after the injury.  Drainage from the ear. The drainage may be clear, cloudy or pus-like, or bloody.  Hearing loss.  Dizziness. How is this diagnosed? This condition is diagnosed based on your symptoms  and medical history as well as a physical exam. Your health care provider can usually see a perforation using an ear scope (otoscope). You may have tests, such as:  A hearing test (audiogram) to check for hearing loss.  A test in which a sample of ear drainage is tested for infection (culture). How is this treated? An eardrum typically heals on its own within a few weeks. If your eardrum does not heal, your health care provider may recommend a procedure to place a patch over your eardrum or surgery to repair your eardrum. Your health care provider may also prescribe antibiotic medicines to help prevent infection. If the ear heals completely, any hearing loss should be temporary. Follow these instructions at home:  Keep your ear dry. This is very important. Follow instructions from your health care provider about how to keep your ear dry. You may need to wear waterproof earplugs when bathing and swimming.  Take over-the-counter and prescription medicines only as told by your health care provider.  Return to sports and activities as told by your health care provider. Ask your health care provider what activities are safe for you.  Wear headgear with ear protection when you play sports in which ear injuries are common.  If directed, apply heat to your affected ear as often as told by your health care provider. Use the heat source that your health care provider recommends, such as a moist heat pack   or a heating pad. This will help to relieve pain. ? Place a towel between your skin and the heat source. ? Leave the heat on for 20-30 minutes. ? Remove the heat if your skin turns bright red. This is especially important if you are unable to feel pain, heat, or cold. You may have a greater risk of getting burned.  Keep all follow-up visits as told by your health care provider. This is important.  Talk to your health care provider before traveling by plane. Contact a health care provider if:  You  have mucus or blood draining from your ear.  You have a fever.  You have ear pain.  You have hearing loss, dizziness, or ringing in your ear. Get help right away if:  You have sudden hearing loss.  You are very dizzy.  You have severe ear pain.  Your face feels weak or becomes paralyzed. Summary  An eardrum rupture is a hole (perforation) in the eardrum that can cause pain and hearing loss. It is usually caused by a sudden injury to the ear.  The eardrum will likely heal on its own within a few weeks. In some cases, surgery may be necessary.  After the injury, follow instructions from your health care provider about how to keep your ear dry as it heals. This information is not intended to replace advice given to you by your health care provider. Make sure you discuss any questions you have with your health care provider. Document Released: 06/03/2000 Document Revised: 08/12/2016 Document Reviewed: 08/12/2016 Elsevier Interactive Patient Education  2019 [employer] Otitis Externa  Otitis externa is an infection of the outer ear canal. The outer ear canal is the area between the outside of the ear and the eardrum. Otitis externa is sometimes called swimmer's ear. What are the causes? Common causes of this condition include:  Swimming in dirty water.  Moisture in the ear.  An injury to the inside of the ear.  An object stuck in the ear.  A cut or scrape on the outside of the ear. What increases the risk? You are more likely to develop this condition if you go swimming often. What are the signs or symptoms? The first symptom of this condition is often itching in the ear. Later symptoms of the condition include:  Swelling of the ear.  Redness in the ear.  Ear pain. The pain may get worse when you pull on your ear.  Pus coming from the ear. How is this diagnosed? This condition may be diagnosed by examining the ear and testing fluid from the ear for bacteria and  funguses. How is this treated? This condition may be treated with:  Antibiotic ear drops. These are often given for 10-14 days.  Medicines to reduce itching and swelling. Follow these instructions at home:  If you were prescribed antibiotic ear drops, use them as told by your health care provider. Do not stop using the antibiotic even if your condition improves.  Take over-the-counter and prescription medicines only as told by your health care provider.  Avoid getting water in your ears as told by your health care provider. This may include avoiding swimming or water sports for a few days.  Keep all follow-up visits as told by your health care provider. This is important. How is this prevented?  Keep your ears dry. Use the corner of a towel to dry your ears after you swim or bathe.  Avoid scratching or putting things in your ear.  Doing these things can damage the ear canal or remove the protective wax that lines it, which makes it easier for bacteria and funguses to grow.  Avoid swimming in lakes, polluted water, or pools that may not have enough chlorine. Contact a health care provider if:  You have a fever.  Your ear is still red, swollen, painful, or draining pus after 3 days.  Your redness, swelling, or pain gets worse.  You have a severe headache.  You have redness, swelling, pain, or tenderness in the area behind your ear. Summary  Otitis externa is an infection of the outer ear canal.  Common causes include swimming in dirty water, moisture in the ear, or a cut or scrape in the ear.  Symptoms include pain, redness, and swelling of the ear.  If you were prescribed antibiotic ear drops, use them as told by your health care provider. Do not stop using the antibiotic even if your condition improves. This information is not intended to replace advice given to you by your health care provider. Make sure you discuss any questions you have with your health care provider.  Document Released: 06/06/2005 Document Revised: 11/10/2017 Document Reviewed: 11/10/2017 Elsevier Interactive Patient Education  2019 [employer].

## 2018-08-29 NOTE — Progress Notes (Signed)
Patient: Patricia Glenn Female    DOB: 12-22-1985   33 y.o.   MRN: [id number] Visit Date: 08/29/2018  Today's Provider: Lavon Paganini, MD   Chief Complaint  Patient presents with  . Ear Pain   Subjective:    I, Tiburcio Pea, CMA, am acting as a scribe for Lavon Paganini, MD.   Otalgia   There is pain in both ears. This is a new problem. The current episode started in the past 7 days. The problem occurs constantly. The problem has been gradually worsening. There has been no fever. Associated symptoms include ear discharge and hearing loss. Treatments tried: Augmentin started on 08/26/2018. The treatment provided no relief.  Patient was seen at Urgent Care on .She was diagnosed with left ruptured eardrum.  She felt similar symptoms in R ear and assumed it ruptured too.  She continues to have pain in R ear and hearing loss in L ear.  She is scheduled for ENT eval in 5 days.  No Known Allergies   Current Outpatient Medications:  .  clindamycin-benzoyl peroxide (BENZACLIN) gel, APPLY A THIN LAYER TO FACE IN THE MORNING. WASH OFF AT BEDTIME, Disp: , Rfl:  .  doxycycline (PERIOSTAT) 20 MG tablet, Take 20 mg by mouth 2 (two) times daily with a meal., Disp: , Rfl:  .  tretinoin (RETIN-A) 0.025 % cream, APPLY TOPICALLY ON THE SKIN AS DIRECTED AT BEDTIME AS TOLERATED., Disp: , Rfl:   Review of Systems  Constitutional: Negative.   HENT: Positive for ear discharge, ear pain and hearing loss.   Respiratory: Negative.   Cardiovascular: Negative.   Musculoskeletal: Negative.     Social History   Tobacco Use  . Smoking status: Never Smoker  . Smokeless tobacco: Never Used  Substance Use Topics  . Alcohol use: Yes    Frequency: Never    Comment: occasional      Objective:   BP (!) 148/100 (BP Location: Right Arm, Patient Position: Sitting, Cuff Size: Normal)   Pulse 93   Temp 98.4 F (36.9 C) (Oral)   Wt 226 lb 6.4 oz (102.7 kg)   SpO2 99%   BMI 35.46 kg/m  Vitals:    08/29/18 1001  BP: (!) 148/100  Pulse: 93  Temp: 98.4 F (36.9 C)  TempSrc: Oral  SpO2: 99%  Weight: 226 lb 6.4 oz (102.7 kg)     Physical Exam Vitals signs reviewed.  Constitutional:      General: She is not in acute distress.    Appearance: Normal appearance. She is not diaphoretic.  HENT:     Head: Normocephalic and atraumatic.     Right Ear: Tympanic membrane normal. No mastoid tenderness.     Ears:     Comments: R canal erythematous and with purulent drainage Small hole noted in L TM with no drainage    Nose: Nose normal.     Mouth/Throat:     Mouth: Mucous membranes are moist.     Pharynx: Oropharynx is clear. No posterior oropharyngeal erythema.  Eyes:     General: No scleral icterus.    Conjunctiva/sclera: Conjunctivae normal.     Pupils: Pupils are equal, round, and reactive to light.  Neck:     Musculoskeletal: Neck supple.  Cardiovascular:     Rate and Rhythm: Normal rate and regular rhythm.  Pulmonary:     Effort: Pulmonary effort is normal. No respiratory distress.  Musculoskeletal:     Right lower leg: No edema.  Left lower leg: No edema.  Lymphadenopathy:     Cervical: No cervical adenopathy.  Skin:    General: Skin is warm and dry.     Capillary Refill: Capillary refill takes less than 2 seconds.     Findings: No rash.  Neurological:     Mental Status: She is alert and oriented to person, place, and time. Mental status is at baseline.  Psychiatric:        Mood and Affect: Mood normal.        Behavior: Behavior normal.        Assessment & Plan   1. Acute otitis externa of right ear, unspecified type - no TM rupture or signs of AOM noted in R ear - does have otitis externa - will start treatment with cortisporin drops until ENT appt next week  2. Non-recurrent acute suppurative otitis media of left ear with spontaneous rupture of tympanic membrane - seems to be healing - has ENT f/u set up - finish Augmentin course as prescribed -  discussed return precautions    Meds ordered this encounter  Medications  . neomycin-polymyxin-hydrocortisone (CORTISPORIN) 3.5-10000-1 OTIC suspension    Sig: Place 3 drops into both ears 4 (four) times daily.    Dispense:  10 mL    Refill:  0    substitution ok for lowest copay     Return if symptoms worsen or fail to improve.   The entirety of the information documented in the History of Present Illness, Review of Systems and Physical Exam were personally obtained by me. Portions of this information were initially documented by Tiburcio Pea, CMA and reviewed by me for thoroughness and accuracy.    Virginia Crews, MD, MPH [facility] 08/29/2018 1:22 PM

## 2018-09-03 ENCOUNTER — Other Ambulatory Visit: Payer: Self-pay | Admitting: Physician Assistant

## 2018-09-03 ENCOUNTER — Telehealth: Payer: Self-pay | Admitting: *Deleted

## 2018-09-03 DIAGNOSIS — R430 Anosmia: Secondary | ICD-10-CM

## 2018-09-03 NOTE — Telephone Encounter (Signed)
Call returned. Patient has already left their office. I explained that Dr. Jacinto Reap is ok with these interventions for the otitis media.

## 2018-09-03 NOTE — Telephone Encounter (Addendum)
Patient in office with fluid behind both ears and they want to order steroids and or Flonase and want Dr B approval for this.Please return call to [facility] at [phone]-ext [phone]

## 2018-09-05 ENCOUNTER — Telehealth: Payer: Self-pay

## 2018-09-05 NOTE — Telephone Encounter (Signed)
Could be related to prednisone.  Would continue and call and discuss with ENT

## 2018-09-05 NOTE — Telephone Encounter (Signed)
Patient's husband Christia Reading advised.

## 2018-09-05 NOTE — Telephone Encounter (Signed)
Patients husband is calling wanting advise. Patients husband states that patient was seen by ENT for ruptured ear drum and was prescribed prednisone. Patients spouse reports that for the past two days patient has complained saying that her hands and feet are cold, reports loss of taste and smell and now diarrhea. Patients spouse wants to know if this is related to medication or if this could be do to something else?KW

## 2018-09-06 ENCOUNTER — Telehealth: Payer: Self-pay | Admitting: Family Medicine

## 2018-09-06 NOTE — Telephone Encounter (Signed)
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

## 2018-09-06 NOTE — Telephone Encounter (Signed)
Can we try [facility].  Oncology and I both think she needs to see an endocrinologist for these reasons.

## 2018-09-06 NOTE — Telephone Encounter (Signed)
Oops, meant to send back to Judson Roch

## 2018-09-12 ENCOUNTER — Ambulatory Visit
Admission: RE | Admit: 2018-09-12 | Discharge: 2018-09-12 | Disposition: A | Discharge status: Home / Self Care | Payer: Managed Care, Other (non HMO) | Source: Ambulatory Visit | Attending: Physician Assistant | Admitting: Physician Assistant

## 2018-09-12 ENCOUNTER — Other Ambulatory Visit: Payer: Self-pay

## 2018-09-12 DIAGNOSIS — R430 Anosmia: Secondary | ICD-10-CM

## 2018-09-12 MED ORDER — GADOBUTROL 1 MMOL/ML IV SOLN
10.0000 mL | Freq: Once | INTRAVENOUS | Status: AC | PRN
  Administered 2018-09-12: 10 mL via INTRAVENOUS

## 2018-09-13 ENCOUNTER — Telehealth: Payer: Self-pay

## 2018-09-13 NOTE — Telephone Encounter (Signed)
I don't know what this last message means.  ENT wants her to be seen here? ENT is going to place referral to Neurology?

## 2018-09-13 NOTE — Telephone Encounter (Signed)
LMTCB- need to clarify previous message.

## 2018-09-13 NOTE — Telephone Encounter (Signed)
ENT office should still be able to take urgent/on-call calls for their office.  They may be able to do procedures still.  She should try to give them a call.

## 2018-09-13 NOTE — Telephone Encounter (Signed)
Pt called and advised that the ENT office is sending over the scan, that she is putting in a referral to neurologist bc there is spinal fluid on the frontal lobe she thinks.  Provider advised that pt could f-up with you (Dr B) if she thought she should go a different way.  Call back # [phone].

## 2018-09-13 NOTE — Telephone Encounter (Signed)
Patient stated that the ENT doctor schedule her to come in to have the clear fluids drained from her ears next week but they are closed now until next month. She stated that she was already on medication for the fluid and it is not helping out so she is going to see what her endocrinologist say on tomorrow. FYI

## 2018-09-14 ENCOUNTER — Telehealth: Payer: Self-pay

## 2018-09-14 NOTE — Telephone Encounter (Signed)
LMTCB- If I am unavailable when patient calls back please clarify the previous message below. I have left several voicemails for patient and then she calls back when I am unavailable.

## 2018-09-14 NOTE — Telephone Encounter (Signed)
LMTCB

## 2018-09-14 NOTE — Telephone Encounter (Signed)
Patient returned call

## 2018-09-14 NOTE — Telephone Encounter (Signed)
Patient called stating that she is returning [facility] call. KW

## 2018-09-18 NOTE — Telephone Encounter (Signed)
Spoke with patient. She states that ENT referred her to Neuro. Patient has a appointment scheduled on 10/01/2018 for further evaluation of MRI result. No further assistance needed at this time. Message was a Pharmacist, hospital.

## 2018-10-04 ENCOUNTER — Ambulatory Visit: Payer: Managed Care, Other (non HMO) | Admitting: Cardiothoracic Surgery

## 2018-11-28 ENCOUNTER — Other Ambulatory Visit: Payer: Self-pay | Admitting: Neurology

## 2018-11-28 DIAGNOSIS — G9601 Cranial cerebrospinal fluid leak, spontaneous: Secondary | ICD-10-CM

## 2018-12-03 ENCOUNTER — Ambulatory Visit
Admission: RE | Admit: 2018-12-03 | Discharge: 2018-12-03 | Disposition: A | Discharge status: Home / Self Care | Payer: Managed Care, Other (non HMO) | Source: Ambulatory Visit | Attending: Neurology | Admitting: Neurology

## 2018-12-03 ENCOUNTER — Other Ambulatory Visit: Payer: Self-pay

## 2018-12-03 DIAGNOSIS — G960 Cerebrospinal fluid leak: Secondary | ICD-10-CM | POA: Diagnosis present

## 2018-12-03 DIAGNOSIS — G9601 Cranial cerebrospinal fluid leak, spontaneous: Secondary | ICD-10-CM

## 2018-12-03 LAB — APTT: aPTT: 34 seconds (ref 24–36)

## 2018-12-03 LAB — PROTIME-INR
INR: 1.1 (ref 0.8–1.2)
Prothrombin Time: 14.2 seconds (ref 11.4–15.2)

## 2018-12-03 LAB — CBC
HCT: 41.8 % (ref 36.0–46.0)
Hemoglobin: 13.6 g/dL (ref 12.0–15.0)
MCH: 26.8 pg (ref 26.0–34.0)
MCHC: 32.5 g/dL (ref 30.0–36.0)
MCV: 82.3 fL (ref 80.0–100.0)
Platelets: 258 10*3/uL (ref 150–400)
RBC: 5.08 MIL/uL (ref 3.87–5.11)
RDW: 14.3 % (ref 11.5–15.5)
WBC: 6.6 10*3/uL (ref 4.0–10.5)
nRBC: 0 % (ref 0.0–0.2)

## 2018-12-03 LAB — PROTEIN, CSF: Total  Protein, CSF: 26 mg/dL (ref 15–45)

## 2018-12-03 LAB — CSF CELL COUNT WITH DIFFERENTIAL
Eosinophils, CSF: 0 %
Lymphs, CSF: 91 %
Monocyte-Macrophage-Spinal Fluid: 9 %
RBC Count, CSF: 0 /mm3 (ref 0–3)
Segmented Neutrophils-CSF: 0 %
Tube #: 3
WBC, CSF: 4 /mm3 (ref 0–5)

## 2018-12-03 LAB — POCT PREGNANCY, URINE: Preg Test, Ur: NEGATIVE

## 2018-12-03 LAB — GLUCOSE, CSF: Glucose, CSF: 62 mg/dL (ref 40–70)

## 2018-12-03 LAB — GLUCOSE, RANDOM: Glucose, Bld: 107 mg/dL — ABNORMAL HIGH (ref 70–99)

## 2018-12-03 MED ORDER — LIDOCAINE HCL (PF) 1 % IJ SOLN
5.0000 mL | Freq: Once | INTRAMUSCULAR | Status: DC
  Filled 2018-12-03: qty 5

## 2018-12-03 NOTE — Discharge Instructions (Signed)
Lumbar Puncture, Care After  This sheet gives you information about how to care for yourself after your procedure. Your health care provider may also give you more specific instructions. If you have problems or questions, contact your health care provider.  What can I expect after the procedure?  After the procedure, it is common to have:   Mild discomfort or pain at the puncture site.   A mild headache that is relieved with pain medicines.  Follow these instructions at home:  Activity     Lie down flat or rest for as long as directed by your health care provider.   Return to your normal activities as told by your health care provider. Ask your health care provider what activities are safe for you.   Avoid lifting anything heavier than 10 lb (4.5 kg) for at least 12 hours after the procedure.   Do not drive for 24 hours if you were given a medicine to help you relax (sedative) during your procedure.   Do not drive or use heavy machinery while taking prescription pain medicine.  Puncture site care   Remove or change your bandage (dressing) as told by your health care provider.   Check your puncture area every day for signs of infection. Check for:  ? More pain.  ? Redness or swelling.  ? Fluid or blood leaking from the puncture site.  ? Warmth.  ? Pus or a bad smell.  General instructions   Take over-the-counter and prescription medicines only as told by your health care provider.   Drink enough fluids to keep your urine clear or pale yellow. Your health care provider may recommend drinking caffeine to prevent a headache.   Keep all follow-up visits as told by your health care provider. This is important.  Contact a health care provider if:   You have fever or chills.   You have nausea or vomiting.   You have a headache that lasts for more than 2 days or does not get better with medicine.  Get help right away if:   You develop any of the following in your  legs:  ? Weakness.  ? Numbness.  ? Tingling.   You are unable to control when you urinate or have a bowel movement (incontinence).   You have signs of infection around your puncture site, such as:  ? More pain.  ? Redness or swelling.  ? Fluid or blood leakage.  ? Warmth.  ? Pus or a bad smell.   You are dizzy or you feel like you might faint.   You have a severe headache, especially when you sit or stand.  Summary   A lumbar puncture is a procedure in which a small needle is inserted into the lower back to remove fluid that surrounds the brain and spinal cord.   After this procedure, it is common to have a headache and pain around the needle insertion area.   Lying flat, staying hydrated, and drinking caffeine can help prevent headaches.   Monitor your needle insertion site for signs of infection, including warmth, fluid, or more pain.   Get help right away if you develop leg weakness, leg numbness, incontinence, or severe headaches.  This information is not intended to replace advice given to you by your health care provider. Make sure you discuss any questions you have with your health care provider.  Document Released: 06/11/2013 Document Revised: 07/20/2016 Document Reviewed: 07/20/2016  Elsevier Interactive Patient Education  2019 [employer]vier Inc[employer]

## 2018-12-03 NOTE — Progress Notes (Signed)
Pt given AVS and post LP instructions.  No complaints of headache or dizziness.  D/c home via w/c with husband,  Aware to remain flat for rest of day

## 2018-12-03 NOTE — Progress Notes (Signed)
Pt stable after lp-vss-back stable, d/c orders given,f/u with her md

## 2018-12-05 ENCOUNTER — Other Ambulatory Visit: Payer: Self-pay

## 2018-12-05 LAB — IGG CSF INDEX
Albumin CSF-mCnc: 17 mg/dL (ref 11–48)
Albumin: 4.6 g/dL (ref 3.8–4.8)
CSF IgG Index: 0.5 (ref 0.0–0.7)
IgG (Immunoglobin G), Serum: 1266 mg/dL (ref 586–1602)
IgG, CSF: 2.5 mg/dL (ref 0.0–8.6)
IgG/Alb Ratio, CSF: 0.15 (ref 0.00–0.25)

## 2018-12-06 ENCOUNTER — Inpatient Hospital Stay: Payer: Managed Care, Other (non HMO) | Attending: Internal Medicine

## 2018-12-06 ENCOUNTER — Other Ambulatory Visit: Payer: Self-pay

## 2018-12-06 ENCOUNTER — Ambulatory Visit: Payer: Managed Care, Other (non HMO)

## 2018-12-06 ENCOUNTER — Encounter: Payer: Self-pay | Admitting: *Deleted

## 2018-12-06 ENCOUNTER — Ambulatory Visit: Admission: RE | Admit: 2018-12-06 | Payer: Managed Care, Other (non HMO) | Source: Ambulatory Visit

## 2018-12-06 DIAGNOSIS — C7A00 Malignant carcinoid tumor of unspecified site: Secondary | ICD-10-CM | POA: Insufficient documentation

## 2018-12-06 DIAGNOSIS — C7B09 Secondary carcinoid tumors of other sites: Secondary | ICD-10-CM | POA: Diagnosis not present

## 2018-12-06 LAB — CBC WITH DIFFERENTIAL/PLATELET
Abs Immature Granulocytes: 0.02 10*3/uL (ref 0.00–0.07)
Basophils Absolute: 0 10*3/uL (ref 0.0–0.1)
Basophils Relative: 1 %
Eosinophils Absolute: 0.2 10*3/uL (ref 0.0–0.5)
Eosinophils Relative: 3 %
HCT: 42.6 % (ref 36.0–46.0)
Hemoglobin: 13.8 g/dL (ref 12.0–15.0)
Immature Granulocytes: 0 %
Lymphocytes Relative: 34 %
Lymphs Abs: 2 10*3/uL (ref 0.7–4.0)
MCH: 26.5 pg (ref 26.0–34.0)
MCHC: 32.4 g/dL (ref 30.0–36.0)
MCV: 81.9 fL (ref 80.0–100.0)
Monocytes Absolute: 0.4 10*3/uL (ref 0.1–1.0)
Monocytes Relative: 6 %
Neutro Abs: 3.3 10*3/uL (ref 1.7–7.7)
Neutrophils Relative %: 56 %
Platelets: 229 10*3/uL (ref 150–400)
RBC: 5.2 MIL/uL — ABNORMAL HIGH (ref 3.87–5.11)
RDW: 14.2 % (ref 11.5–15.5)
WBC: 5.9 10*3/uL (ref 4.0–10.5)
nRBC: 0 % (ref 0.0–0.2)

## 2018-12-06 LAB — COMPREHENSIVE METABOLIC PANEL
ALT: 31 U/L (ref 0–44)
AST: 24 U/L (ref 15–41)
Albumin: 4.6 g/dL (ref 3.5–5.0)
Alkaline Phosphatase: 66 U/L (ref 38–126)
Anion gap: 8 (ref 5–15)
BUN: 16 mg/dL (ref 6–20)
CO2: 19 mmol/L — ABNORMAL LOW (ref 22–32)
Calcium: 8.7 mg/dL — ABNORMAL LOW (ref 8.9–10.3)
Chloride: 111 mmol/L (ref 98–111)
Creatinine, Ser: 0.96 mg/dL (ref 0.44–1.00)
GFR calc Af Amer: >60 mL/min (ref >60)
GFR calc non Af Amer: >60 mL/min (ref >60)
Glucose, Bld: 116 mg/dL — ABNORMAL HIGH (ref 70–99)
Potassium: 3.7 mmol/L (ref 3.5–5.1)
Sodium: 138 mmol/L (ref 135–145)
Total Bilirubin: 0.3 mg/dL (ref 0.3–1.2)
Total Protein: 7.7 g/dL (ref 6.5–8.1)

## 2018-12-06 LAB — OLIGOCLONAL BANDS, CSF + SERM

## 2018-12-06 LAB — HSV(HERPES SMPLX VRS)ABS-I+II(IGG)-CSF: HSV Type I/II Ab, IgG CSF: <0.34 IV (ref <=0.89)

## 2018-12-06 NOTE — Telephone Encounter (Signed)
I will call pt today.  GB

## 2018-12-07 ENCOUNTER — Inpatient Hospital Stay: Payer: Managed Care, Other (non HMO) | Admitting: Internal Medicine

## 2018-12-11 LAB — CHROMOGRANIN A REBASELINE
Chromogranin A (ng/mL): 41.1 ng/mL (ref 0.0–101.8)
Chromogranin A: 2 nmol/L (ref 0–5)

## 2018-12-12 LAB — 5 HIAA, QUANTITATIVE, URINE, 24 HOUR
5-HIAA, Ur: 3 mg/L
5-HIAA,Quant.,24 Hr Urine: 4.5 mg/24 hr (ref 0.0–14.9)
Total Volume: 1500

## 2018-12-13 ENCOUNTER — Ambulatory Visit: Payer: Managed Care, Other (non HMO)

## 2018-12-14 ENCOUNTER — Inpatient Hospital Stay: Payer: Managed Care, Other (non HMO) | Admitting: Internal Medicine

## 2018-12-18 ENCOUNTER — Other Ambulatory Visit: Payer: Self-pay | Admitting: Cardiothoracic Surgery

## 2018-12-18 DIAGNOSIS — C7B00 Secondary carcinoid tumors, unspecified site: Secondary | ICD-10-CM

## 2018-12-19 ENCOUNTER — Other Ambulatory Visit: Payer: Self-pay | Admitting: Neurology

## 2018-12-19 DIAGNOSIS — H93A3 Pulsatile tinnitus, bilateral: Secondary | ICD-10-CM

## 2018-12-19 DIAGNOSIS — M5414 Radiculopathy, thoracic region: Secondary | ICD-10-CM

## 2018-12-19 NOTE — Progress Notes (Deleted)
[address]       [place] [address]             [phone]                  [place] Medical Record #[id number] Date of Birth: [birth year]  Referring AS:NKNL, Christia Reading, MD Primary Cardiology: Primary Care:Bacigalupo, Dionne Bucy, MD  Chief Complaint:  Follow Up Visit 12/29/2017 PROCEDURE PERFORMED:  Video bronchoscopy, left lower lobectomy with lymph node dissection and placement of On-Q. SURGEON:  Lanelle Bal, MD Diagnosis 1. Lymph node, biopsy, Left 8 - THERE IS NO EVIDENCE OF MALIGNANCY IN 1 OF 1 LYMPH NODE (0/1). 2. Lymph node, biopsy, Left 8 #2 - THERE IS NO EVIDENCE OF MALIGNANCY IN 1 OF 1 LYMPH NODE (0/1). 3. 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 4. Lung, resection (segmental or lobe), LLL - CARCINOID TUMOR, 4.5 CM. - PERINEURAL INVASION IS IDENTIFIED. - LYMPHOVASCULAR INVASION IS IDENTIFIED. - THE SURGICAL RESECTION MARGINS ARE NEGATIVE FOR TUMOR. - SEE ONCOLOGY TABLE BELOW. 5. Lymph node, biopsy, 11 L - METASTATIC CARCINOID TUMOR IN 1 OF 1 LYMPH NODE (1/1). 1 of 4 Amended copy Corrected FINAL for [facility], Gwenlyn R [id number]) Diagnosis(continued) 6. 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 7. Lymph node, biopsy, #3, left - THERE IS NO EVIDENCE OF MALIGNANCY IN 1 OF 1 LYMPH NODE (0/1). 8. Lymph node, biopsy, 10 L - THERE IS NO EVIDENCE OF MALIGNANCY IN 1 OF 1 LYMPH NODE (0/1). 9. Lymph node, biopsy, 11 L - BENIGN FIBROADIPOSE TISSUE. - LYMPH NODAL TISSUE IS NOT IDENTIFIED. - THERE IS NO EVIDENCE OF MALIGNANCY.  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  Patient has been seen by oncology both at [place], some hormonal blood work was recommended but neither  recommended chemotherapy.  Patient is now under schedule of q. 31-month CT scans of the chest.  She is making good progress postoperatively returning to exercising and caring for her 3 children one who was born just before her lung resection.        Zubrod Score: At the time of surgery this patient's most appropriate activity status/level should be described as: [x]     0    Normal activity, no symptoms []     1    Restricted in physical strenuous activity but ambulatory, able to do out light work []     2    Ambulatory and capable of self care, unable to do work activities, up and about                 >50 % of waking hours                                                                                   []     3    Only limited self care, in bed greater than 50%  of waking hours []     4    Completely disabled, no self care, confined to bed or chair []     5    Moribund  Social History   Tobacco Use  Smoking Status Never Smoker  Smokeless Tobacco Never Used       No Known Allergies  Current Outpatient Medications  Medication Sig Dispense Refill  . acetaZOLAMIDE (DIAMOX) 500 MG capsule Take 500 mg by mouth 2 (two) times daily.    . clindamycin-benzoyl peroxide (BENZACLIN) gel APPLY A THIN LAYER TO FACE IN THE MORNING. WASH OFF AT BEDTIME    . doxycycline (PERIOSTAT) 20 MG tablet Take 20 mg by mouth 2 (two) times daily with a meal.    . neomycin-polymyxin-hydrocortisone (CORTISPORIN) 3.5-10000-1 OTIC suspension Place 3 drops into both ears 4 (four) times daily. 10 mL 0  . tretinoin (RETIN-A) 0.025 % cream APPLY TOPICALLY ON THE SKIN AS DIRECTED AT BEDTIME AS TOLERATED.     No current facility-administered medications for this visit.        Physical Exam: There were no vitals taken for this visit.  {physical exam:21449} Diagnostic Studies & Laboratory data:         Recent Radiology Findings: No results found.    I have independently reviewed the above radiology findings and  reviewed findings  with the patient.  Recent Labs: Lab Results  Component Value Date   WBC 5.9 12/06/2018   HGB 13.8 12/06/2018   HCT 42.6 12/06/2018   PLT 229 12/06/2018   GLUCOSE 116 (H) 12/06/2018   ALT 31 12/06/2018   AST 24 12/06/2018   NA 138 12/06/2018   K 3.7 12/06/2018   CL 111 12/06/2018   CREATININE 0.96 12/06/2018   BUN 16 12/06/2018   CO2 19 (L) 12/06/2018   INR 1.1 12/03/2018      Assessment / Plan:     Grace Isaac 12/19/2018 2:02 PM

## 2018-12-20 ENCOUNTER — Ambulatory Visit: Payer: Managed Care, Other (non HMO) | Admitting: Cardiothoracic Surgery

## 2018-12-20 ENCOUNTER — Telehealth: Payer: Self-pay | Admitting: Cardiothoracic Surgery

## 2019-01-03 ENCOUNTER — Ambulatory Visit
Admission: RE | Admit: 2019-01-03 | Discharge: 2019-01-03 | Disposition: A | Discharge status: Home / Self Care | Payer: Managed Care, Other (non HMO) | Source: Ambulatory Visit | Attending: Neurology | Admitting: Neurology

## 2019-01-03 ENCOUNTER — Other Ambulatory Visit: Payer: Managed Care, Other (non HMO)

## 2019-01-03 ENCOUNTER — Ambulatory Visit: Payer: Managed Care, Other (non HMO)

## 2019-01-03 ENCOUNTER — Other Ambulatory Visit: Payer: Self-pay

## 2019-01-03 DIAGNOSIS — M5414 Radiculopathy, thoracic region: Secondary | ICD-10-CM | POA: Insufficient documentation

## 2019-01-03 DIAGNOSIS — H93A3 Pulsatile tinnitus, bilateral: Secondary | ICD-10-CM | POA: Diagnosis present

## 2019-01-03 MED ORDER — GADOBUTROL 1 MMOL/ML IV SOLN
9.0000 mL | Freq: Once | INTRAVENOUS | Status: AC | PRN
  Administered 2019-01-03: 9 mL via INTRAVENOUS

## 2019-01-31 ENCOUNTER — Ambulatory Visit (INDEPENDENT_AMBULATORY_CARE_PROVIDER_SITE_OTHER): Payer: Managed Care, Other (non HMO) | Admitting: Cardiothoracic Surgery

## 2019-01-31 ENCOUNTER — Encounter: Payer: Self-pay | Admitting: Cardiothoracic Surgery

## 2019-01-31 ENCOUNTER — Other Ambulatory Visit: Payer: Self-pay

## 2019-01-31 VITALS — BP 122/81 | HR 93 | Temp 97.7°F | Resp 98 | Ht 67.0 in | Wt 225.0 lb

## 2019-01-31 DIAGNOSIS — Z902 Acquired absence of lung [part of]: Secondary | ICD-10-CM | POA: Diagnosis not present

## 2019-01-31 DIAGNOSIS — C7B00 Secondary carcinoid tumors, unspecified site: Secondary | ICD-10-CM | POA: Diagnosis not present

## 2019-01-31 NOTE — Patient Instructions (Addendum)
Procedure: Gallium-68 Dotatate PET scan from the skull vertex to the mid thighs.  Indication: Female, 33 years old. 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  Additional clinical history: History of left lower lobe carcinoid tumor, status post left lower lobe wedge resection December 29, 2017. Continues to have symptoms such as flushing in the neck, hirsutism, loss of taste/smell  Comparison: None  Radiopharmaceutical: 5.31 mCi of Ga-68 Dotatate, intravenously. Time from injection to imaging: 60 minutes.  Technique: PET imaging was performed from the skull vertex to the mid thighs using routine PET acquisition following intravenous administration of Ga-68 Dotatate, per standard protocol. A separate diagnostic CT scan was also performed. Oral and IV contrast were administered as part of the contemporaneous CT of the chest, abdomen, and pelvis in order to improve sensitivity for disease detection.   FINDINGS:  Neck:   - Thyroid: Unremarkable  - Aerodigestive: Unremarkable  - Nodes: Unremarkable  Chest:  - Pleura: Unremarkable  - Cardiac: Unremarkable.  - Pulmonary: Postsurgical changes of left lower lobe with resection. No focal dotatate uptake to suggest locally recurrent disease.  - Nodes: Unremarkable  Abdomen/Pelvis:  - Liver: Unremarkable  - Gallbladder: Unremarkable  - Spleen: Unremarkable  - Pancreas: Unremarkable  - Kidneys: Unremarkable  - Adrenal glands: Unremarkable  - Bowel: Unremarkable  - Genitourinary: Unremarkable  - Nodes: Unremarkable  Musculoskeletal:  - Bones: Unremarkable  - Soft tissues: Unremarkable   IMPRESSION:  No somatostatin receptor avid disease.   Please see contemporaneous CT scan  report.   Electronically Reviewed by: Joselyn Arrow, MD, [facility] Electronically Reviewed on: 01/29/2019 3:37 PM  I have reviewed the images and concur with the above findings.  Electronically Signed by: Kyra Leyland, MD, [facility] Electronically Signed on: 01/29/2019 4:27 PM  Other Result Information  Interface, Rad Results In - 01/29/2019  4:28 PM EDT Procedure: Gallium-68 Dotatate PET scan from the skull vertex to the mid thighs.  Indication: Female, 33 years old. 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  Additional clinical history: History of left lower lobe carcinoid tumor, status post left lower lobe wedge resection December 29, 2017. Continues to have symptoms such as flushing in the neck, hirsutism, loss of taste/smell  Comparison: None  Radiopharmaceutical: 5.31 mCi of Ga-68 Dotatate, intravenously. Time from injection to imaging: 60 minutes.  Technique: PET  imaging was performed from the skull vertex to the mid thighs using routine PET acquisition following intravenous administration of Ga-68 Dotatate, per standard protocol. A separate diagnostic CT scan was also performed. Oral and IV contrast were administered as part of the contemporaneous CT of the chest, abdomen, and pelvis in order to improve sensitivity for disease detection.   FINDINGS:  Neck:   - Thyroid: Unremarkable  - Aerodigestive: Unremarkable  - Nodes: Unremarkable  Chest:  - Pleura: Unremarkable  - Cardiac: Unremarkable.  - Pulmonary: Postsurgical changes of left lower lobe with resection. No focal dotatate uptake to suggest locally recurrent disease.  - Nodes: Unremarkable  Abdomen/Pelvis:  - Liver: Unremarkable  - Gallbladder: Unremarkable  -  Spleen: Unremarkable  - Pancreas: Unremarkable  - Kidneys: Unremarkable  - Adrenal glands: Unremarkable  - Bowel: Unremarkable  - Genitourinary: Unremarkable  - Nodes:  Unremarkable  Musculoskeletal:  - Bones: Unremarkable  - Soft tissues: Unremarkable   IMPRESSION:  No somatostatin receptor avid disease.   Please see contemporaneous CT scan report.   Electronically Reviewed by:  Joselyn Arrow, MD, [facility] Electronically Reviewed on:  01/29/2019 3:37 PM  I have reviewed the images and concur with the above findings.  Electronically Signed by:  Kyra Leyland, MD, [facility] Electronically Signed on:  01/29/2019 4:27 PM  Status

## 2019-01-31 NOTE — Progress Notes (Signed)
[address]       [place] [address]             [phone]                  Serrita Glenn [place] Medical Record #[id number] Date of Birth: 09/10/1985  Referring CX:KGYJ, Christia Reading, MD Primary Cardiology: Primary Care:Bacigalupo, Dionne Bucy, MD  Chief Complaint:  Follow Up Visit 12/29/2017 PROCEDURE PERFORMED:  Video bronchoscopy, left lower lobectomy with lymph node dissection and placement of On-Q. SURGEON:  Lanelle Bal, MD    Diagnosis 1. Lymph node, biopsy, Left 8 - THERE IS NO EVIDENCE OF MALIGNANCY IN 1 OF 1 LYMPH NODE (0/1). 2. Lymph node, biopsy, Left 8 #2 - THERE IS NO EVIDENCE OF MALIGNANCY IN 1 OF 1 LYMPH NODE (0/1). 3. 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 4. Lung, resection (segmental or lobe), LLL - CARCINOID TUMOR, 4.5 CM. - PERINEURAL INVASION IS IDENTIFIED. - LYMPHOVASCULAR INVASION IS IDENTIFIED. - THE SURGICAL RESECTION MARGINS ARE NEGATIVE FOR TUMOR. - SEE ONCOLOGY TABLE BELOW. 5. Lymph node, biopsy, 11 L - METASTATIC CARCINOID TUMOR IN 1 OF 1 LYMPH NODE (1/1). 1 of 4 Amended copy Corrected FINAL for [facility], Patricia Glenn [id number]) Diagnosis(continued) 6. 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 7. Lymph node, biopsy, #3, left - THERE IS NO EVIDENCE OF MALIGNANCY IN 1 OF 1 LYMPH NODE (0/1). 8. Lymph node, biopsy, 10 L - THERE IS NO EVIDENCE OF MALIGNANCY IN 1 OF 1 LYMPH NODE (0/1). 9. Lymph node, biopsy, 11 L - BENIGN FIBROADIPOSE TISSUE. - LYMPH NODAL TISSUE IS NOT IDENTIFIED. - THERE IS NO EVIDENCE OF MALIGNANCY.  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  Patient has been seen by oncology both at [place], some hormonal blood work was recommended but neither  recommended chemotherapy.    Patient has developed some low thoracic spine discomfort, different from the initial postoperative pain.  She also developed CSF drainage from her ears.  She recently was seen in the [place] oncology a Ga-68 Dotatate PET scan and CT scan was done.  There was no evidence of recurrent disease.     Zubrod Score: At the time of surgery this patients most appropriate activity status/level should be described as: [x]     0    Normal activity, no symptoms []     1    Restricted in physical strenuous activity but ambulatory, able to do out light work []     2    Ambulatory and capable of self care, unable to do work activities, up and about                 >50 % of waking hours                                                                                   []   3    Only limited self care, in bed greater than 50% of waking hours []     4    Completely disabled, no self care, confined to bed or chair []     5    Moribund  Social History   Tobacco Use  Smoking Status Never Smoker  Smokeless Tobacco Never Used       No Known Allergies  Current Outpatient Medications  Medication Sig Dispense Refill   hydrochlorothiazide (HYDRODIURIL) 25 MG tablet Take 25 mg by mouth daily.     No current facility-administered medications for this visit.        Physical Exam: BP 122/81 (BP Location: Right Arm, Patient Position: Sitting, Cuff Size: Large)    Pulse 93    Temp 97.7 F (36.5 C)    Resp (!) 98 Comment: RA   Ht 5\' 7"  (1.702 m)    Wt 225 lb (102.1 kg)    BMI 35.24 kg/m   General appearance: alert, cooperative and no distress Neurologic: intact Heart: regular rate and rhythm, S1, S2 normal, no murmur, click, rub or gallop Lungs: clear to auscultation bilaterally Abdomen: soft, non-tender; bowel sounds normal; no masses,  no organomegaly Extremities: extremities normal, atraumatic, no cyanosis or edema and Homans sign is negative, no sign of DVT Wound: Chest  incision chest tube sites are well-healed   Diagnostic Studies & Laboratory data:         Recent Radiology Findings:  Procedure: CT Chest with IV Contrast  Procedure: CT Abdomen and Pelvis with IV Contrast  Indication: Female, 33 years old. 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  Additional clinical history: History of left lower lobe carcinoid tumor, status post left lower lobe wedge resection December 29, 2017. Continues to have symptoms such as flushing in the neck, hirsutism, loss of taste/smell, "rupture of tympanic membrane"  Comparison: None  Technique: A dual liver protocol CT was performed of the chest, abdomen, and pelvis following the administration of intravenous and oral contrast. Imaging was performed in the arterial phase through the liver, followed by portal venous phase of the chest, abdomen, and pelvis. Iodinated contrast was used due to the indications for the examination, to improve disease detection and to further define anatomy. Coronal and sagittal reformatted images of the abdomen and pelvis were generated and reviewed. 3-D maximum intensity projection (MIP) reconstructions of the chest were performed to potentially increase study sensitivity.  Oral Contrast: Readi-Cat IV Contrast: Isovue 300   FINDINGS:  Chest:   - Thyroid: Normal in appearance.  - Pleura: No pleural effusion.  - Cardiac: No pericardial effusion.  - Pulmonary: Postsurgical changes of left lower lobe wedge resection. Central airways clear. No suspicious pulmonary nodule.  - Nodes: No lymphadenopathy within the chest.  - Vascular: Normal size and configuration of the thoracic aorta and main pulmonary artery.  - Soft tissues:  Unremarkable  Abdomen/pelvis:   - Liver: No focal lesions  - Gallbladder: Normal in appearance.  - Spleen: Normal in appearance.  - Pancreas: Normal in appearance.  - Kidneys: Normal in appearance.  - Adrenal glands: Normal in appearance.  - Gastrointestinal: Non-obstructive pattern. No focal lesions.  - Genitourinary: Unremarkable  - Nodes: No retroperitoneal, mesenteric, or pelvic adenopathy  - Vascular: Unremarkable  - Soft tissues: Unremarkable  Musculoskeletal:  - Bones: Unremarkable   IMPRESSION:  No CT  evidence of locally recurrent or metastatic disease.   Please see contemporaneous DOTATATE-PET scan report.  Procedure: Gallium-68 Dotatate PET scan from the skull vertex to the mid thighs.  Indication: Female, 33 years old. 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  Additional clinical history: History of left lower lobe carcinoid tumor, status post left lower lobe wedge resection December 29, 2017. Continues to have symptoms such as flushing in the neck, hirsutism, loss of taste/smell  Comparison: None  Radiopharmaceutical: 5.31 mCi of Ga-68 Dotatate, intravenously. Time from injection to imaging: 60 minutes.  Technique: PET imaging was performed from the skull vertex to the mid thighs using routine PET acquisition following intravenous administration of Ga-68 Dotatate, per standard protocol. A separate diagnostic CT scan was also performed. Oral and IV contrast were administered as part of the contemporaneous CT of the chest, abdomen, and pelvis in order to improve sensitivity for disease detection.   FINDINGS:  Neck:   - Thyroid: Unremarkable  - Aerodigestive: Unremarkable  - Nodes: Unremarkable  Chest:  - Pleura:  Unremarkable  - Cardiac: Unremarkable.  - Pulmonary: Postsurgical changes of left lower lobe with resection. No focal dotatate uptake to suggest locally recurrent disease.  - Nodes: Unremarkable  Abdomen/Pelvis:  - Liver: Unremarkable  - Gallbladder: Unremarkable  - Spleen: Unremarkable  - Pancreas: Unremarkable  - Kidneys: Unremarkable  - Adrenal glands: Unremarkable  - Bowel: Unremarkable  - Genitourinary: Unremarkable  - Nodes: Unremarkable  Musculoskeletal:  - Bones: Unremarkable  - Soft tissues: Unremarkable   IMPRESSION:  No somatostatin receptor avid disease.   Please see contemporaneous CT scan report.   Electronically Reviewed by: Joselyn Arrow, MD, [facility] Electronically Reviewed on: 01/29/2019 3:37 PM  I have reviewed the images and concur with the above findings.  Electronically Signed by: Kyra Leyland, MD, [facility] Electronically Signed on: 01/29/2019 4:27 PM   Recent Labs: Lab Results  Component Value Date   WBC 5.9 12/06/2018   HGB 13.8 12/06/2018   HCT 42.6 12/06/2018   PLT 229 12/06/2018   GLUCOSE 116 (H) 12/06/2018   ALT 31 12/06/2018   AST 24 12/06/2018   NA 138 12/06/2018   K 3.7 12/06/2018   CL 111 12/06/2018   CREATININE 0.96 12/06/2018   BUN 16 12/06/2018   CO2 19 (L) 12/06/2018   INR 1.1 12/03/2018      Assessment / Plan:   Patient now 1 year follow up left lower lobectomy for neuroendocrine/carcinoid tumor with metastasis to one lymph node.  Follow-up CT scan including dotatate PET scan shows no evidence of recurrence.  She will have follow-up scans at [facility] in 1 year I will plan to see her back in the office in 6 months to evaluate for symptoms.  Grace Isaac 01/31/2019 3:10 PM

## 2019-03-19 NOTE — Progress Notes (Signed)
Patient: Patricia Glenn, Female    DOB: [birth year], 33 y.o.   MRN: [id number] Visit Date: 03/20/2019  Today's Provider: Lavon Paganini, MD   Chief Complaint  Patient presents with   Annual Exam   Subjective:  I, Porsha McClurkin CMA, am acting as a scribe for Lavon Paganini, MD.    Annual physical exam Patricia Glenn is a 33 y.o. female who presents today for health maintenance and complete physical. She feels well. She reports exercising not much since find out about fluid on brain per pt. She reports she is sleeping well.  ----------------------------------------------------------------- Last pap:11/29/2017   Review of Systems  Constitutional: Positive for fatigue.  HENT: Negative.   Eyes: Negative.   Respiratory: Negative.   Cardiovascular: Negative.   Gastrointestinal: Negative.   Endocrine: Negative.   Genitourinary: Negative.   Musculoskeletal: Negative.   Skin: Negative.   Allergic/Immunologic: Negative.   Neurological: Negative.   Hematological: Negative.   Psychiatric/Behavioral: Negative.     Social History She  reports that she has never smoked. She has never used smokeless tobacco. She reports current alcohol use. She reports that she does not use drugs. Social History   Socioeconomic History   Marital status: Married    Spouse name: Not on file   Number of children: Not on file   Years of education: Not on file   Highest education level: Not on file  Occupational History   Not on file  Social Needs   Financial resource strain: Not on file   Food insecurity    Worry: Not on file    Inability: Not on file   Transportation needs    Medical: Not on file    Non-medical: Not on file  Tobacco Use   Smoking status: Never Smoker   Smokeless tobacco: Never Used  Substance and Sexual Activity   Alcohol use: Yes    Frequency: Never    Comment: occasional   Drug use: No   Sexual activity: Not on file  Lifestyle    Physical activity    Days per week: Not on file    Minutes per session: Not on file   Stress: Not on file  Relationships   Social connections    Talks on phone: Not on file    Gets together: Not on file    Attends religious service: Not on file    Active member of club or organization: Not on file    Attends meetings of clubs or organizations: Not on file    Relationship status: Not on file  Other Topics Concern   Not on file  Social History Narrative    stay home mom-; lives at home; 8 miles from here; no smoking; no second smoking; alcohol ocassional; 2 daughters.     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 ([place]) 11/17/2017    Past Surgical History:  Procedure Laterality Date   FLEXIBLE BRONCHOSCOPY N/A 11/29/2017   Procedure: FLEXIBLE BRONCHOSCOPY;  Surgeon: Wilhelmina Mcardle, MD;  Location: [facility] ORS;  Service: Pulmonary;  Laterality: N/A;   VIDEO ASSISTED THORACOSCOPY (VATS)/WEDGE RESECTION Left 12/29/2017   Procedure: Left VIDEO ASSISTED THORACOSCOPY with Mini Thoracotomy, Left Lower Lobe Lobectomy, Node Dissection,  Insertion of  ONQ pain pump;  Surgeon: Grace Isaac, MD;  Location: [facility];  Service: Thoracic;  Laterality: Left;   VIDEO BRONCHOSCOPY N/A 12/29/2017   Procedure: VIDEO BRONCHOSCOPY;  Surgeon: Grace Isaac, MD;  Location[facility] OR;  Service: Thoracic;  Laterality: N/A;    Family History  Family Status  Relation Name Status   MGF  Alive   Father  (Not Specified)   MGM  (Not Specified)   PGM  Deceased   PGF  Deceased   Neg Hx  (Not Specified)   Her family history includes Bladder Cancer (age of onset: 21) in her paternal grandfather; Diabetes in her maternal grandmother; Heart disease (age of onset: 28) in her paternal grandmother; Parkinson's disease in her maternal  grandmother; Prostate cancer (age of onset: 40) in her father.     No Known Allergies  Previous Medications   HYDROCHLOROTHIAZIDE (HYDRODIURIL) 25 MG TABLET    Take 25 mg by mouth daily.    Patient Care Team: Virginia Crews, MD as PCP - General (Family Medicine) Telford Nab, RN as Registered Nurse      Objective:   Vitals: BP 134/86 (BP Location: Left Arm, Patient Position: Sitting, Cuff Size: Normal)    Pulse 90    Temp (!) 96.9 F (36.1 C) (Temporal)    Wt 223 lb (101.2 kg)    SpO2 99%    BMI 34.93 kg/m    Physical Exam Vitals signs reviewed.  Constitutional:      General: She is not in acute distress.    Appearance: Normal appearance. She is well-developed. She is not diaphoretic.  HENT:     Head: Normocephalic and atraumatic.     Right Ear: Tympanic membrane, ear canal and external ear normal.     Left Ear: Tympanic membrane, ear canal and external ear normal.  Eyes:     General: No scleral icterus.    Conjunctiva/sclera: Conjunctivae normal.     Pupils: Pupils are equal, round, and reactive to light.  Neck:     Musculoskeletal: Neck supple.     Thyroid: No thyromegaly.  Cardiovascular:     Rate and Rhythm: Normal rate and regular rhythm.     Pulses: Normal pulses.     Heart sounds: Normal heart sounds. No murmur.  Pulmonary:     Effort: Pulmonary effort is normal. No respiratory distress.     Breath sounds: Normal breath sounds. No wheezing or rales.  Abdominal:     General: There is no distension.     Palpations: Abdomen is soft.     Tenderness: There is no abdominal tenderness.  Musculoskeletal:        General: No deformity.     Right lower leg: No edema.     Left lower leg: No edema.  Lymphadenopathy:     Cervical: No cervical adenopathy.  Skin:    General: Skin is warm and dry.     Capillary Refill: Capillary refill takes less than 2 seconds.     Findings: No rash.  Neurological:     Mental Status: She is alert and oriented to person, place,  and time. Mental status is at baseline.  Psychiatric:        Mood and Affect: Mood normal.        Behavior: Behavior normal.        Thought Content: Thought content normal.      Depression Screen PHQ 2/9 Scores 03/20/2019 08/24/2018  PHQ - 2 Score 0 1  PHQ- 9 Score 1 3    Assessment & Plan:     Routine Health Maintenance  and Physical Exam  Exercise Activities and Dietary recommendations Goals   None     Immunization History  Administered Date(s) Administered   Influenza Inj Mdck Quad With Preservative 06/21/2017   Influenza, Quadrivalent, Recombinant, Inj, Pf 05/01/2018   Influenza,inj,Quad PF,6+ Mos 03/20/2019   Tdap 09/02/2014, 10/03/2017    Health Maintenance  Topic Date Due   INFLUENZA VACCINE  01/19/2019   PAP SMEAR-Modifier  05/25/2020   TETANUS/TDAP  10/04/2027   HIV Screening  Completed     Discussed health benefits of physical activity, and encouraged her to engage in regular exercise appropriate for her age and condition.    --------------------------------------------------------------------  Problem List Items Addressed This Visit      Other   Obesity    Discussed importance of healthy weight management Discussed diet and exercise       Relevant Orders   Lipid panel   TSH    Other Visit Diagnoses    Encounter for annual physical exam    -  Primary   Relevant Orders   Lipid panel   TSH   Need for influenza vaccination       Relevant Orders   Flu Vaccine QUAD 36+ mos IM (Completed)       Return in about 1 year (around 03/19/2020) for CPE.   The entirety of the information documented in the History of Present Illness, Review of Systems and Physical Exam were personally obtained by me. Portions of this information were initially documented by [facility], CMA and reviewed by me for thoroughness and accuracy.    Eileen Kangas, Dionne Bucy, MD MPH [facility]

## 2019-03-20 ENCOUNTER — Ambulatory Visit (INDEPENDENT_AMBULATORY_CARE_PROVIDER_SITE_OTHER): Payer: Managed Care, Other (non HMO) | Admitting: Family Medicine

## 2019-03-20 ENCOUNTER — Other Ambulatory Visit: Payer: Self-pay

## 2019-03-20 ENCOUNTER — Encounter: Payer: Self-pay | Admitting: Family Medicine

## 2019-03-20 VITALS — BP 134/86 | HR 90 | Temp 96.9°F | Wt 223.0 lb

## 2019-03-20 DIAGNOSIS — E669 Obesity, unspecified: Secondary | ICD-10-CM | POA: Diagnosis not present

## 2019-03-20 DIAGNOSIS — Z6834 Body mass index (BMI) 34.0-34.9, adult: Secondary | ICD-10-CM | POA: Diagnosis not present

## 2019-03-20 DIAGNOSIS — Z0000 Encounter for general adult medical examination without abnormal findings: Secondary | ICD-10-CM | POA: Diagnosis not present

## 2019-03-20 DIAGNOSIS — Z23 Encounter for immunization: Secondary | ICD-10-CM | POA: Diagnosis not present

## 2019-03-20 NOTE — Patient Instructions (Signed)
Preventive Care 21-33 Years Old, Female Preventive care refers to visits with your health care provider and lifestyle choices that can promote health and wellness. This includes:  A yearly physical exam. This may also be called an annual well check.  Regular dental visits and eye exams.  Immunizations.  Screening for certain conditions.  Healthy lifestyle choices, such as eating a healthy diet, getting regular exercise, not using drugs or products that contain nicotine and tobacco, and limiting alcohol use. What can I expect for my preventive care visit? Physical exam Your health care provider will check your:  Height and weight. This may be used to calculate body mass index (BMI), which tells if you are at a healthy weight.  Heart rate and blood pressure.  Skin for abnormal spots. Counseling Your health care provider may ask you questions about your:  Alcohol, tobacco, and drug use.  Emotional well-being.  Home and relationship well-being.  Sexual activity.  Eating habits.  Work and work environment.  Method of birth control.  Menstrual cycle.  Pregnancy history. What immunizations do I need?  Influenza (flu) vaccine  This is recommended every year. Tetanus, diphtheria, and pertussis (Tdap) vaccine  You may need a Td booster every 10 years. Varicella (chickenpox) vaccine  You may need this if you have not been vaccinated. Human papillomavirus (HPV) vaccine  If recommended by your health care provider, you may need three doses over 6 months. Measles, mumps, and rubella (MMR) vaccine  You may need at least one dose of MMR. You may also need a second dose. Meningococcal conjugate (MenACWY) vaccine  One dose is recommended if you are age 19-21 years and a first-year college student living in a residence hall, or if you have one of several medical conditions. You may also need additional booster doses. Pneumococcal conjugate (PCV13) vaccine  You may need  this if you have certain conditions and were not previously vaccinated. Pneumococcal polysaccharide (PPSV23) vaccine  You may need one or two doses if you smoke cigarettes or if you have certain conditions. Hepatitis A vaccine  You may need this if you have certain conditions or if you travel or work in places where you may be exposed to hepatitis A. Hepatitis B vaccine  You may need this if you have certain conditions or if you travel or work in places where you may be exposed to hepatitis B. Haemophilus influenzae type b (Hib) vaccine  You may need this if you have certain conditions. You may receive vaccines as individual doses or as more than one vaccine together in one shot (combination vaccines). Talk with your health care provider about the risks and benefits of combination vaccines. What tests do I need?  Blood tests  Lipid and cholesterol levels. These may be checked every 5 years starting at age 20.  Hepatitis C test.  Hepatitis B test. Screening  Diabetes screening. This is done by checking your blood sugar (glucose) after you have not eaten for a while (fasting).  Sexually transmitted disease (STD) testing.  BRCA-related cancer screening. This may be done if you have a family history of breast, ovarian, tubal, or peritoneal cancers.  Pelvic exam and Pap test. This may be done every 3 years starting at age 21. Starting at age 30, this may be done every 5 years if you have a Pap test in combination with an HPV test. Talk with your health care provider about your test results, treatment options, and if necessary, the need for more tests.   Follow these instructions at home: Eating and drinking   Eat a diet that includes fresh fruits and vegetables, whole grains, lean protein, and low-fat dairy.  Take vitamin and mineral supplements as recommended by your health care provider.  Do not drink alcohol if: ? Your health care provider tells you not to drink. ? You are  pregnant, may be pregnant, or are planning to become pregnant.  If you drink alcohol: ? Limit how much you have to 0-1 drink a day. ? Be aware of how much alcohol is in your drink. In the [place] one drink equals one 12 oz bottle of beer (355 mL), one 5 oz glass of wine (148 mL), or one 1 oz glass of hard liquor (44 mL). Lifestyle  Take daily care of your teeth and gums.  Stay active. Exercise for at least 30 minutes on 5 or more days each week.  Do not use any products that contain nicotine or tobacco, such as cigarettes, e-cigarettes, and chewing tobacco. If you need help quitting, ask your health care provider.  If you are sexually active, practice safe sex. Use a condom or other form of birth control (contraception) in order to prevent pregnancy and STIs (sexually transmitted infections). If you plan to become pregnant, see your health care provider for a preconception visit. What's next?  Visit your health care provider once a year for a well check visit.  Ask your health care provider how often you should have your eyes and teeth checked.  Stay up to date on all vaccines. This information is not intended to replace advice given to you by your health care provider. Make sure you discuss any questions you have with your health care provider. Document Released: 08/02/2001 Document Revised: 02/15/2018 Document Reviewed: 02/15/2018 Elsevier Patient Education  2020 Elsevier Inc.  

## 2019-03-20 NOTE — Assessment & Plan Note (Signed)
Discussed importance of healthy weight management Discussed diet and exercise  

## 2019-03-21 LAB — LIPID PANEL
Chol/HDL Ratio: 6.1 ratio — ABNORMAL HIGH (ref 0.0–4.4)
Cholesterol, Total: 177 mg/dL (ref 100–199)
HDL: 29 mg/dL — ABNORMAL LOW (ref >39)
LDL Chol Calc (NIH): 78 mg/dL (ref 0–99)
Triglycerides: 436 mg/dL — ABNORMAL HIGH (ref 0–149)
VLDL Cholesterol Cal: 70 mg/dL — ABNORMAL HIGH (ref 5–40)

## 2019-03-21 LAB — TSH: TSH: 2.35 u[IU]/mL (ref 0.450–4.500)

## 2019-06-05 ENCOUNTER — Other Ambulatory Visit: Payer: Self-pay | Admitting: *Deleted

## 2019-06-05 DIAGNOSIS — C7B00 Secondary carcinoid tumors, unspecified site: Secondary | ICD-10-CM

## 2019-07-11 ENCOUNTER — Ambulatory Visit: Payer: Managed Care, Other (non HMO) | Admitting: Cardiothoracic Surgery

## 2019-07-11 ENCOUNTER — Other Ambulatory Visit: Payer: Managed Care, Other (non HMO)

## 2019-07-11 IMAGING — CR DG CHEST 2V
2 series · 2 of 2 positions shown · non-contrast
Comparison: Chest CT scan November 15, 2017 and chest x-ray of the
same day

CLINICAL DATA: Preoperative examination prior to left lower
lobectomy for lung mass. Nonsmoker.

EXAM:
CHEST - 2 VIEW

[w chest pa]
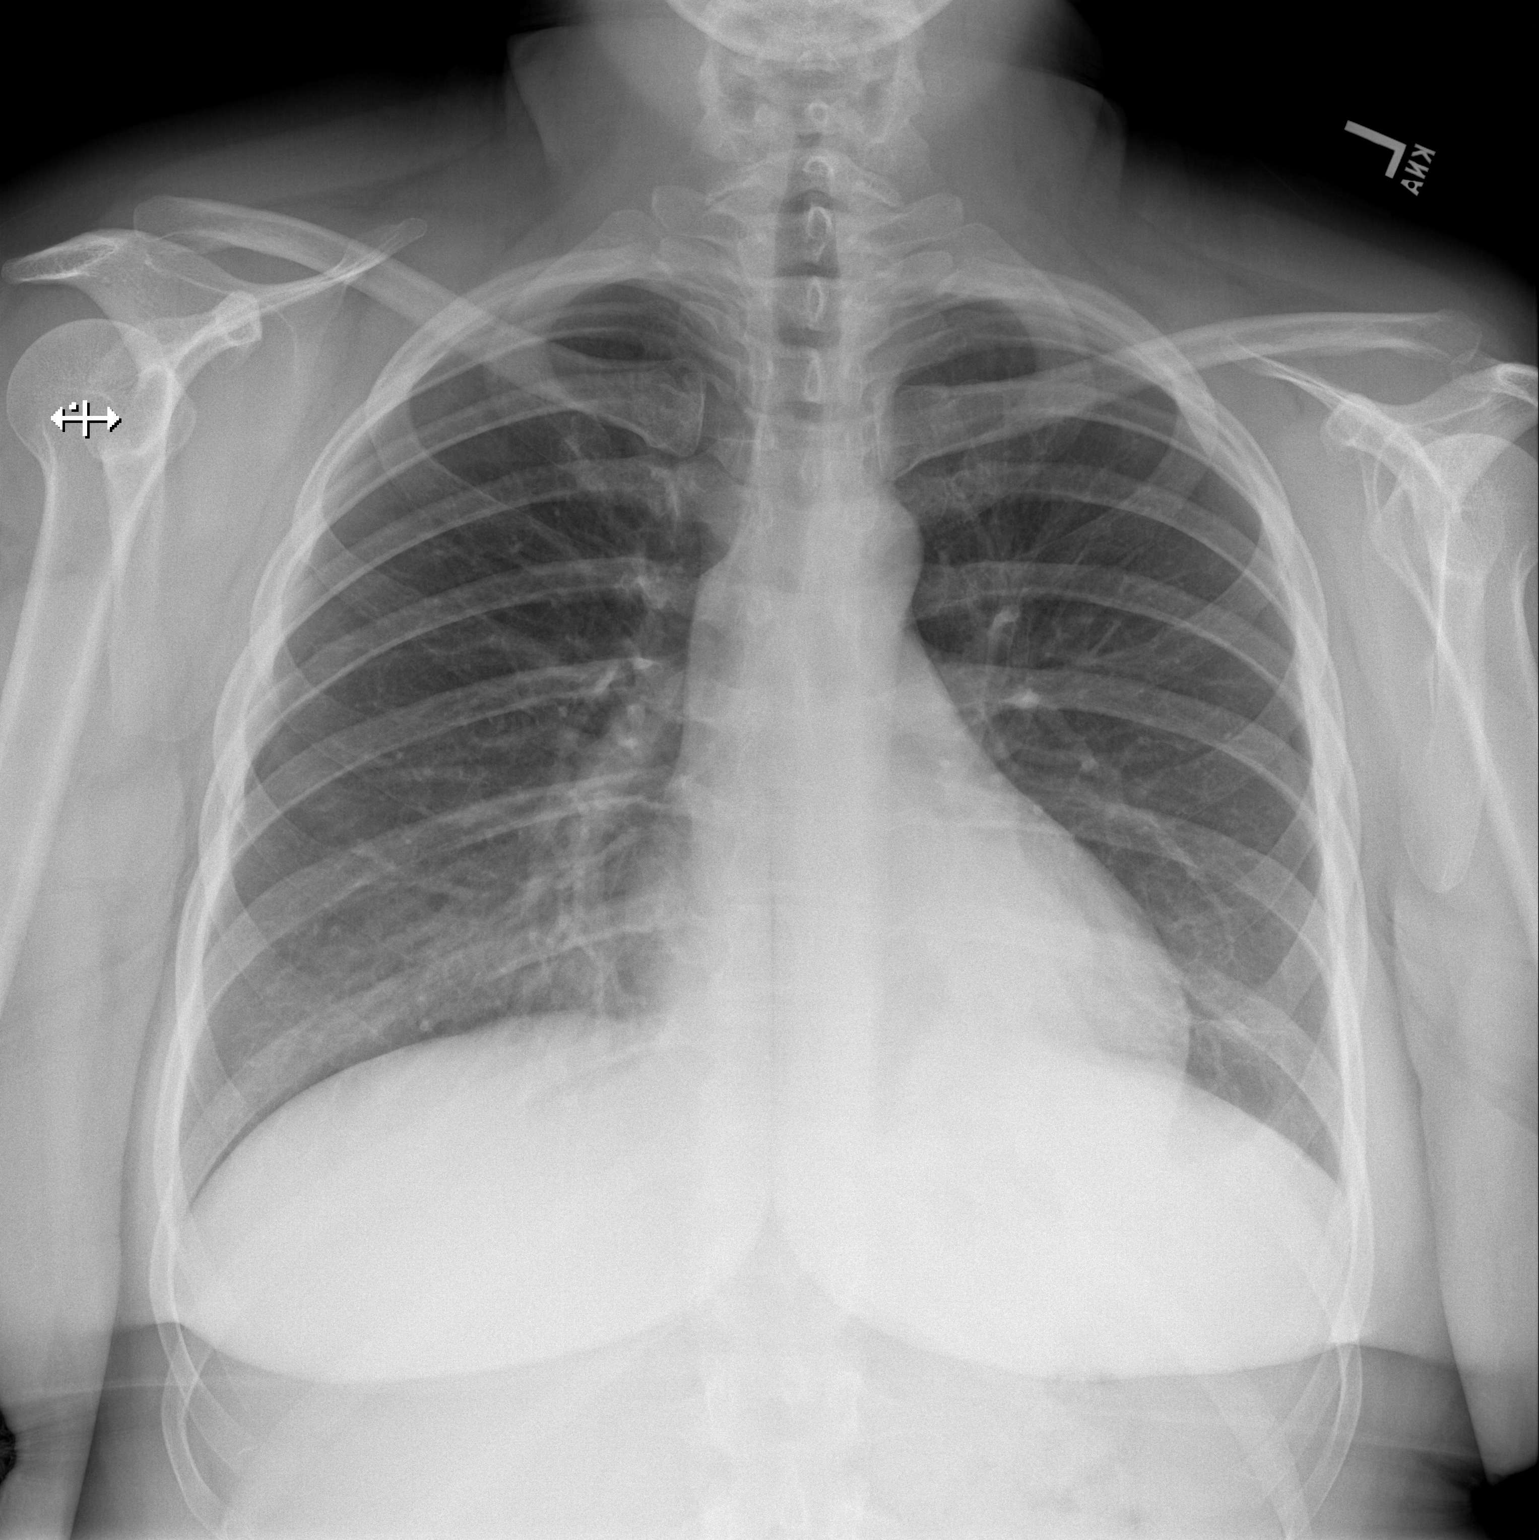

[w chest lat]
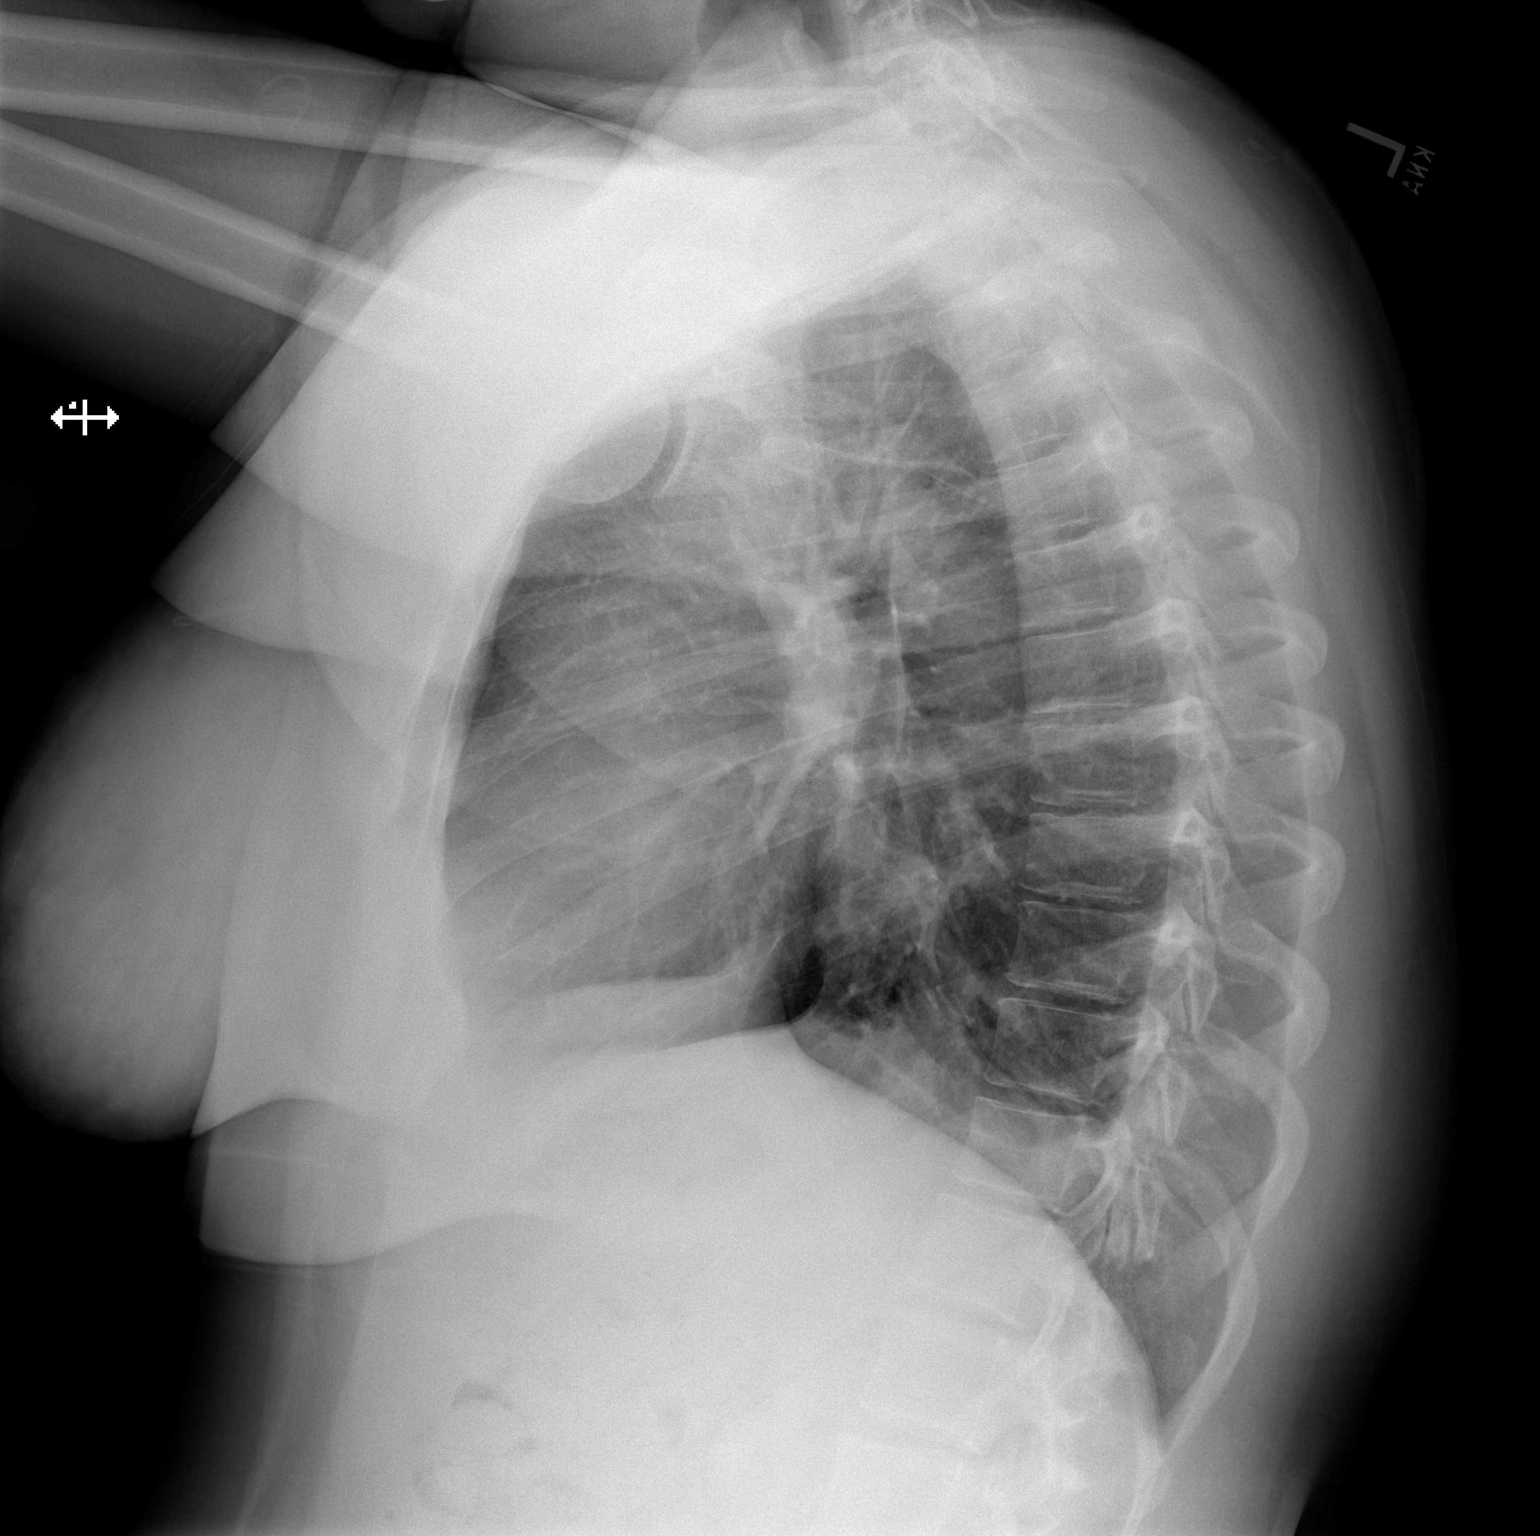

[2 of 2 positions shown; findings below may reference images not displayed]

FINDINGS: The lungs are adequately inflated. There is persistent increased
density in the left retrocardiac region. There is mild blunting of
the posterior costophrenic angle. The right lung is clear. The heart
and pulmonary vascularity are normal. The mediastinum is normal in
width. The bony thorax is unremarkable.
IMPRESSION: Persistent known mass in the left lower lobe. Otherwise no acute
cardiopulmonary abnormality.

## 2019-07-14 IMAGING — DX DG CHEST 1V PORT
1 series · 1 of 1 positions shown · non-contrast
Comparison: 12/29/2017

CLINICAL DATA: Pneumothorax. Postop resection of left lower lobe
mass.

EXAM:
PORTABLE CHEST 1 VIEW

[chest]
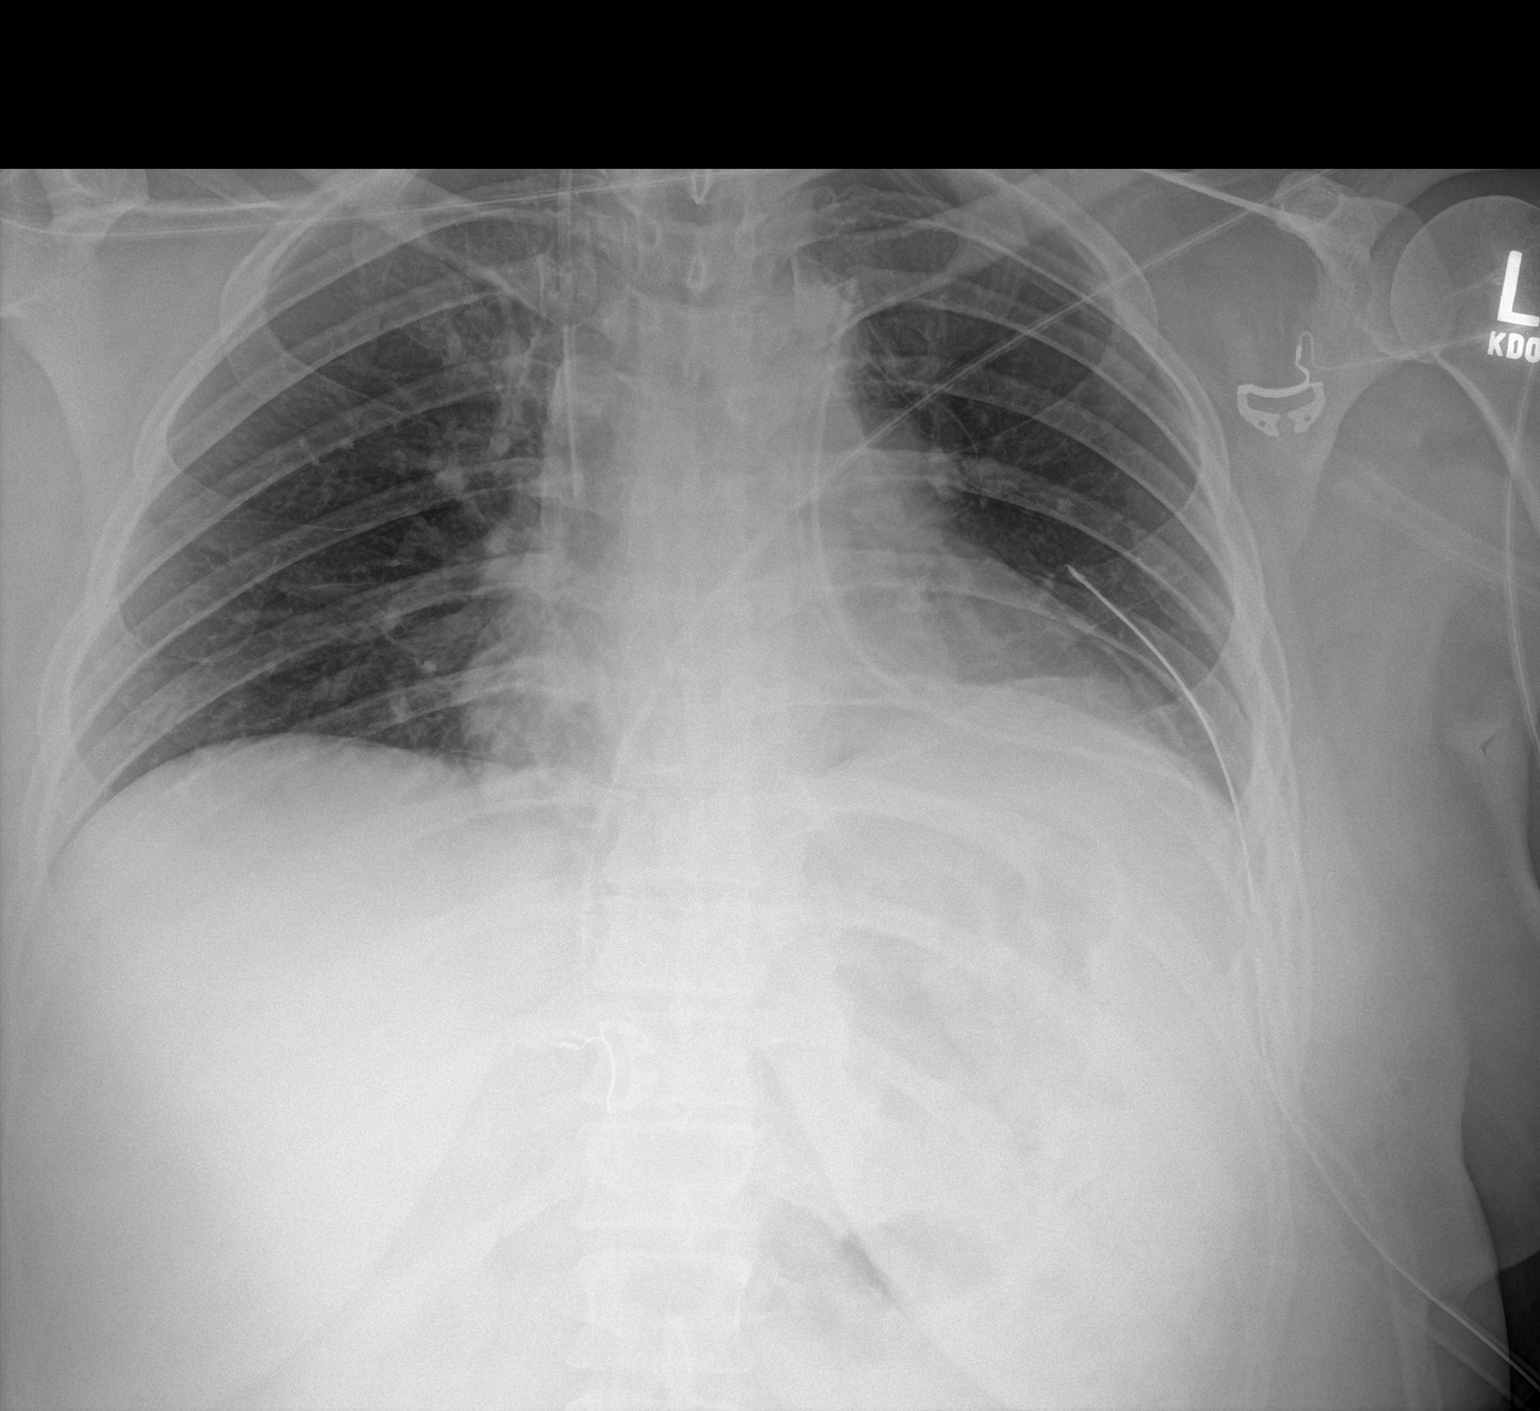

[1 of 1 positions shown; findings below may reference images not displayed]

FINDINGS: Two chest tubes on the left remain in place without pneumothorax.
Central line in the SVC unchanged

Bibasilar airspace disease left greater than right unchanged. No
edema or effusion
IMPRESSION: No active disease.

## 2019-07-15 IMAGING — DX DG CHEST 1V PORT
1 series · 1 of 1 positions shown · non-contrast
Comparison: 12/30/2017.

CLINICAL DATA: Evaluate chest tube placement.

EXAM:
PORTABLE CHEST 1 VIEW

[chest ap]
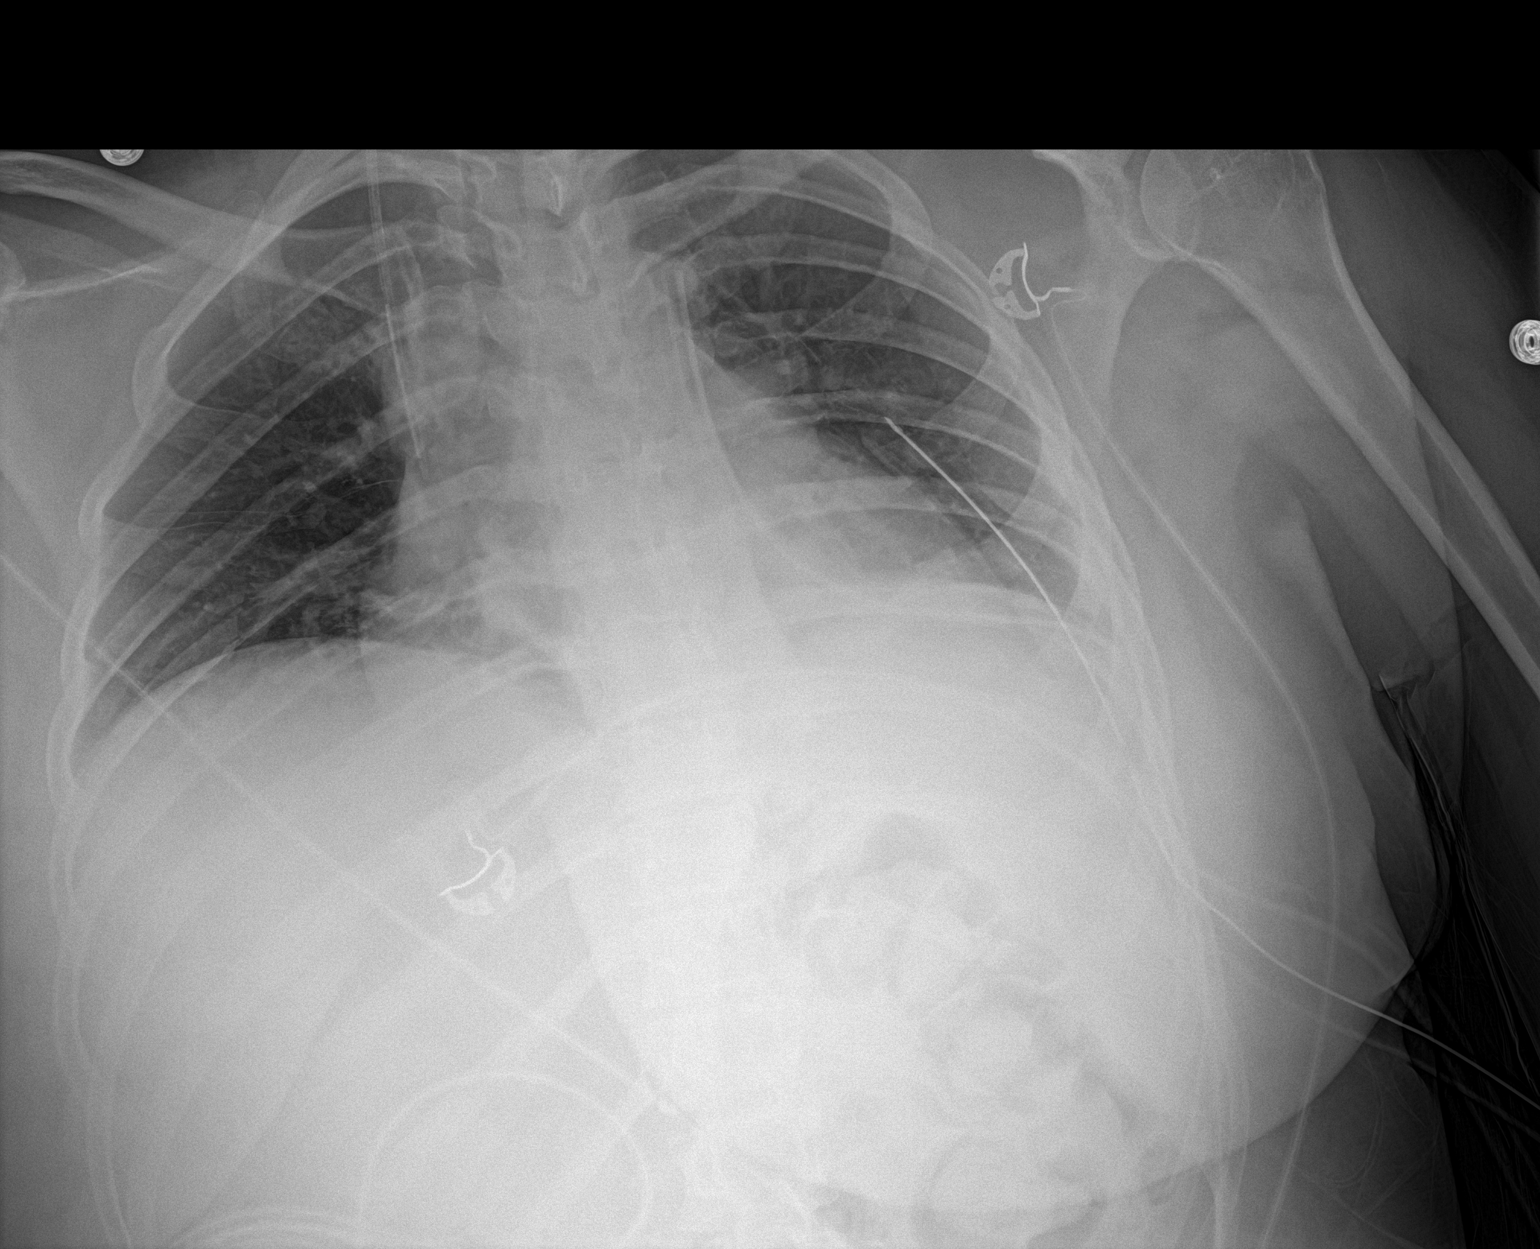

[1 of 1 positions shown; findings below may reference images not displayed]

FINDINGS: Two left chest tubes are in place. No pneumothorax. Right IJ
catheter tip is at the cavoatrial junction. No pleural effusion or
edema identified. No focal pulmonary opacities.
IMPRESSION: 1. Left chest tubes in place without pneumothorax.

## 2019-07-18 IMAGING — DX DG CHEST 2V
2 series · 2 of 2 positions shown · non-contrast
Comparison: 01/02/2018

CLINICAL DATA: Chest tube

EXAM:
CHEST - 2 VIEW

[chest pa]
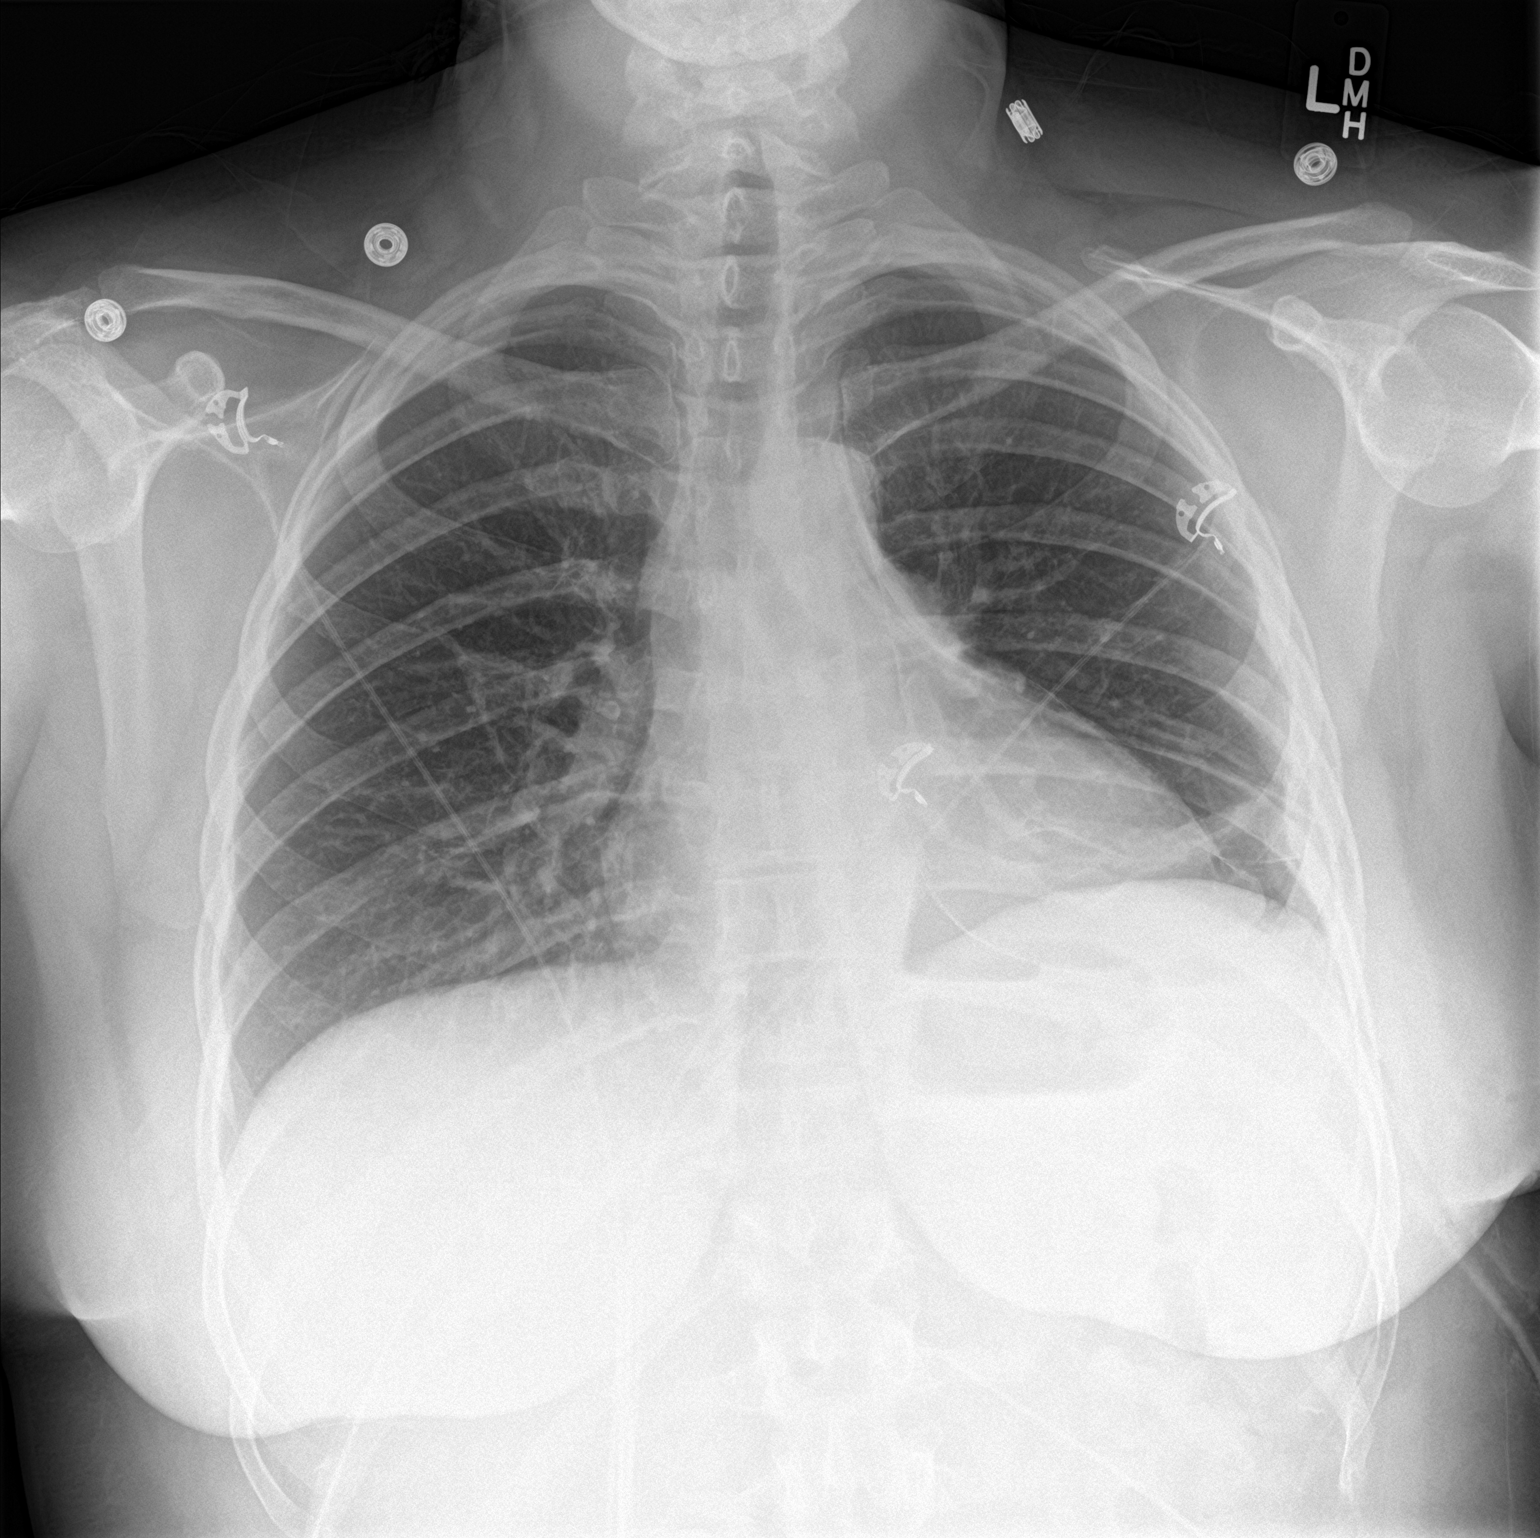

[chest lat]
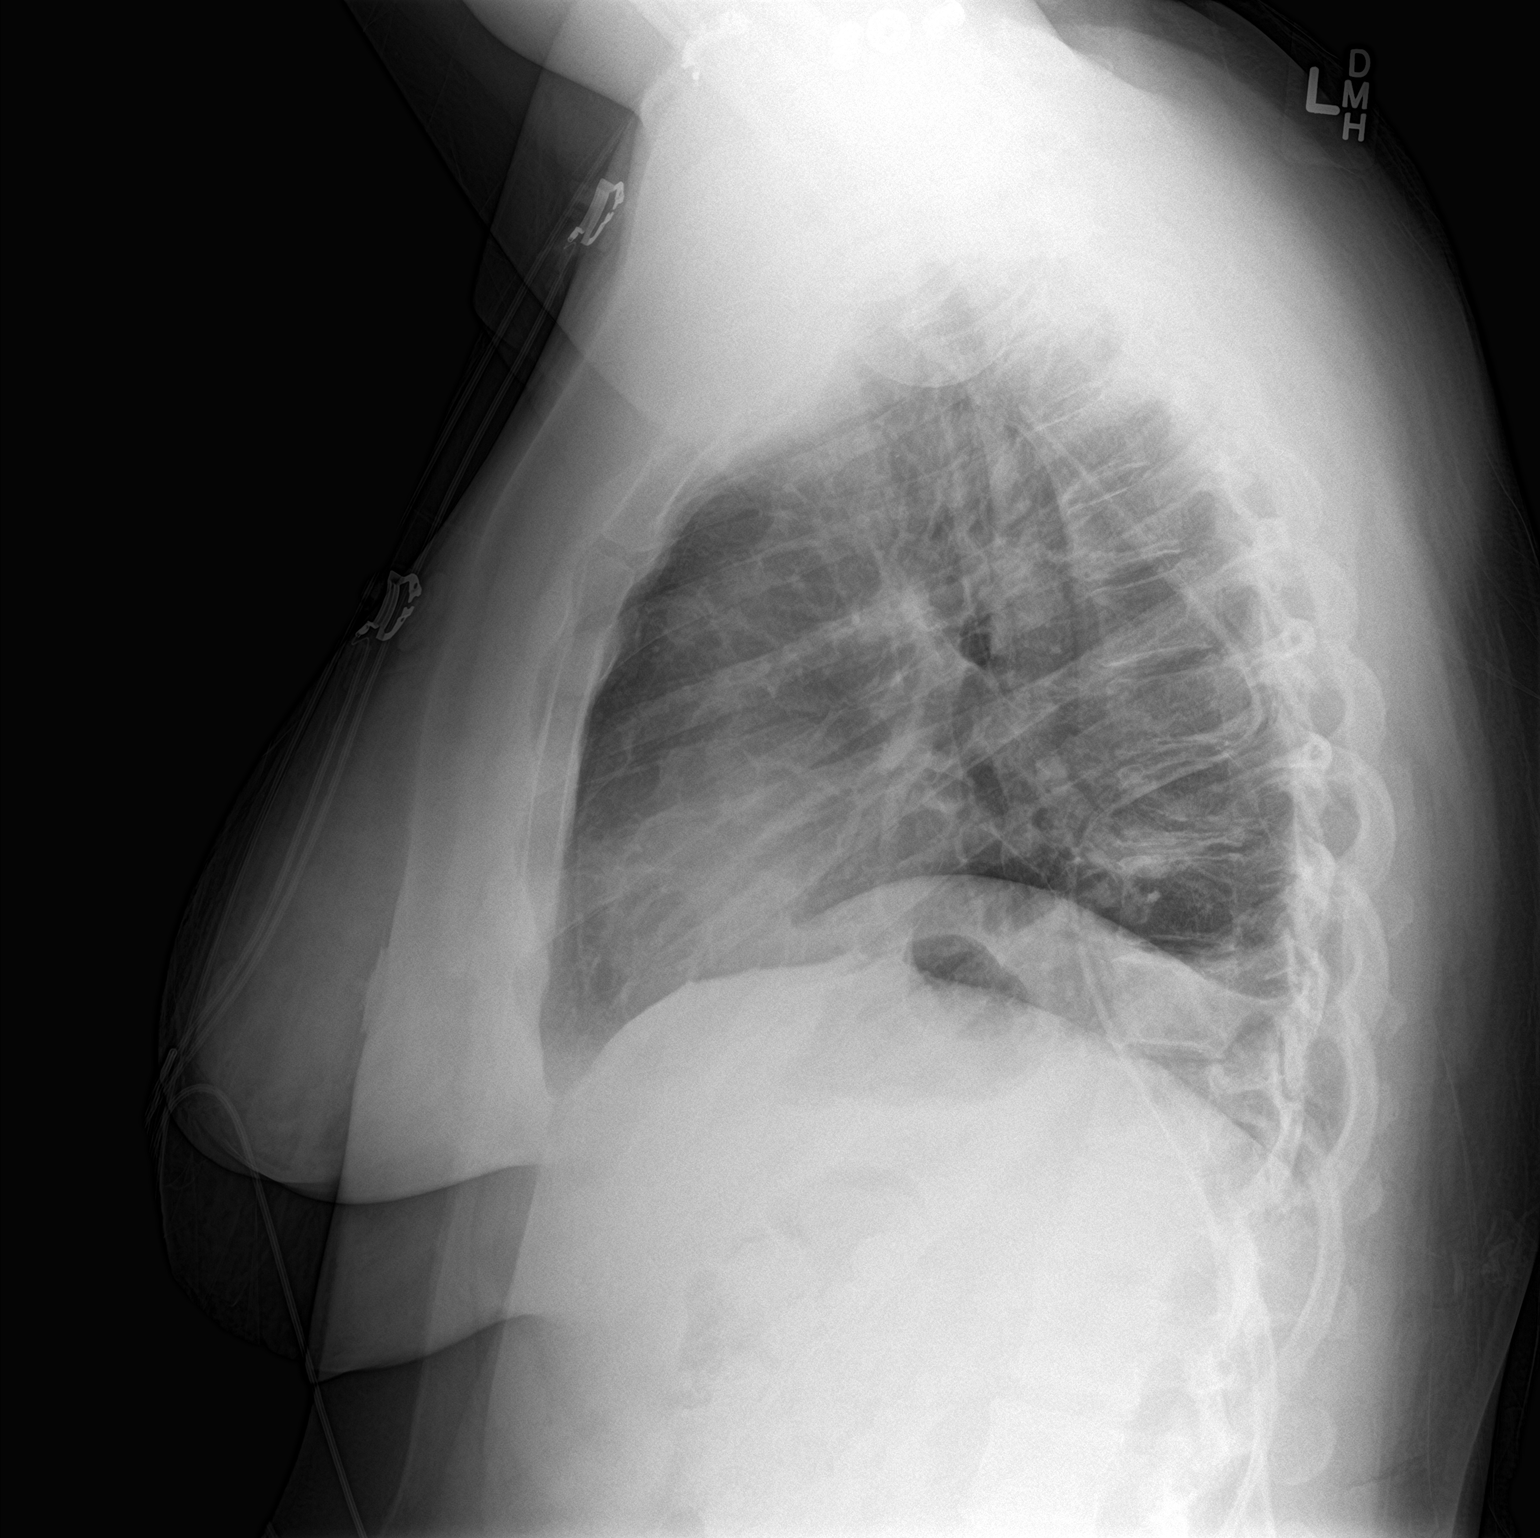

[2 of 2 positions shown; findings below may reference images not displayed]

FINDINGS: Left-sided chest tube in satisfactory position. Possible tiny
loculated left basilar pneumothorax.. Right lung is clear. No
pleural effusion. Stable cardiomediastinal silhouette. No acute
osseous abnormality.
IMPRESSION: 1. Left-sided chest tube in satisfactory position. Possible tiny
loculated left basilar pneumothorax..

## 2019-08-01 ENCOUNTER — Encounter: Payer: Managed Care, Other (non HMO) | Admitting: Cardiothoracic Surgery

## 2019-08-27 ENCOUNTER — Telehealth: Payer: Self-pay | Admitting: *Deleted

## 2019-08-27 NOTE — Telephone Encounter (Signed)
Patient called to request labs to be drawn from [facility] at the [facility] [facility]. I personally spoke with Dr. Bradd Canary. Patient no longer follow-uping at the [facility]. He will not be the ordering provider for [facility] labs. He asks that the patient [facility] to [facility] [place] for these lab draws. I left a vm for the patient regarding the above.

## 2019-09-05 ENCOUNTER — Encounter: Payer: Managed Care, Other (non HMO) | Admitting: Cardiothoracic Surgery

## 2019-09-19 ENCOUNTER — Other Ambulatory Visit: Payer: Self-pay

## 2019-09-19 ENCOUNTER — Ambulatory Visit (INDEPENDENT_AMBULATORY_CARE_PROVIDER_SITE_OTHER): Payer: Managed Care, Other (non HMO) | Admitting: Cardiothoracic Surgery

## 2019-09-19 ENCOUNTER — Encounter: Payer: Self-pay | Admitting: Cardiothoracic Surgery

## 2019-09-19 VITALS — BP 132/89 | HR 99 | Temp 97.5°F | Resp 16 | Ht 67.0 in | Wt 220.0 lb

## 2019-09-19 DIAGNOSIS — C7B09 Secondary carcinoid tumors of other sites: Secondary | ICD-10-CM | POA: Diagnosis not present

## 2019-09-19 DIAGNOSIS — Z902 Acquired absence of lung [part of]: Secondary | ICD-10-CM | POA: Diagnosis not present

## 2019-09-19 DIAGNOSIS — C7A00 Malignant carcinoid tumor of unspecified site: Secondary | ICD-10-CM

## 2019-09-19 NOTE — Progress Notes (Signed)
[address]       [place] [address]             [phone]                  Patricia Glenn Medical Record [id number] Date of Birth: Jan 11, 1986  Referring HP:[id number], Patricia Reading, MD Primary Cardiology: Primary Care:Patricia Glenn, Patricia Bucy, MD  Chief Complaint:  Follow Up Visit 12/29/2017 PROCEDURE PERFORMED:  Video bronchoscopy, left lower lobectomy with lymph node dissection and placement of On-Q. SURGEON:  Patricia Bal, MD    Diagnosis 1. Lymph node, biopsy, Left 8 - THERE IS NO EVIDENCE OF MALIGNANCY IN 1 OF 1 LYMPH NODE (0/1). 2. Lymph node, biopsy, Left 8 #2 - THERE IS NO EVIDENCE OF MALIGNANCY IN 1 OF 1 LYMPH NODE (0/1). 3. 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 4. Lung, resection (segmental or lobe), LLL - CARCINOID TUMOR, 4.5 CM. - PERINEURAL INVASION IS IDENTIFIED. - LYMPHOVASCULAR INVASION IS IDENTIFIED. - THE SURGICAL RESECTION MARGINS ARE NEGATIVE FOR TUMOR. - SEE ONCOLOGY TABLE BELOW. 5. Lymph node, biopsy, 11 L - METASTATIC CARCINOID TUMOR IN 1 OF 1 LYMPH NODE (1/1). 1 of 4 Amended copy Corrected FINAL for [facility], Patricia Glenn [phone]) Diagnosis(continued) 6. 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 7. Lymph node, biopsy, #3, left - THERE IS NO EVIDENCE OF MALIGNANCY IN 1 OF 1 LYMPH NODE (0/1). 8. Lymph node, biopsy, 10 L - THERE IS NO EVIDENCE OF MALIGNANCY IN 1 OF 1 LYMPH NODE (0/1). 9. Lymph node, biopsy, 11 L - BENIGN FIBROADIPOSE TISSUE. - LYMPH NODAL TISSUE IS NOT IDENTIFIED. - THERE IS NO EVIDENCE OF MALIGNANCY.  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  Patient has been seen by oncology both at [facility] and [facility]   Patient has developed some low thoracic spine  discomfort, different from the initial postoperative pain.  She also developed CSF drainage from her ears.  She recently was seen in the [facility] oncology, aug 2020  a Ga-68 Dotatate PET scan and CT scan was done.  There was no evidence of recurrent disease.  Patient was to have a follow-up dotatate scan this month-she reports that her oncologist at [place] attempted to get this approved by her insurance but was declined-it is now arranged for August 2021    Zubrod Score: At the time of surgery this patient's most appropriate activity status/level should be described as: [x]     0    Normal activity, no symptoms []     1    Restricted in physical strenuous activity but ambulatory, able to do out light work []     2    Ambulatory and capable of self care, unable to do work activities, up and about                 >50 % of waking hours                                                                                   []   3    Only limited self care, in bed greater than 50% of waking hours []     4    Completely disabled, no self care, confined to bed or chair []     5    Moribund  Social History   Tobacco Use  Smoking Status Never Smoker  Smokeless Tobacco Never Used       No Known Allergies  Current Outpatient Medications  Medication Sig Dispense Refill  . hydrochlorothiazide (HYDRODIURIL) 25 MG tablet Take 25 mg by mouth daily.     No current facility-administered medications for this visit.       Physical Exam: BP 132/89 (BP Location: Left Arm, Patient Position: Sitting, Cuff Size: Large)   Pulse 99   Temp (!) 97.5 F (36.4 C)   Resp 16   Ht 5\' 7"  (1.702 m)   Wt 220 lb (99.8 kg)   SpO2 98% Comment: RA  BMI 34.46 kg/m  General appearance: alert, cooperative and no distress Head: Normocephalic, without obvious abnormality, atraumatic Neck: no adenopathy, no carotid bruit, no JVD, supple, symmetrical, trachea midline and thyroid not enlarged, symmetric, no tenderness/mass/nodules  Lymph nodes: Cervical, supraclavicular, and axillary nodes normal. Resp: clear to auscultation bilaterally Back: symmetric, no curvature. ROM normal. No CVA tenderness. Cardio: regular rate and rhythm, S1, S2 normal, no murmur, click, rub or gallop GI: soft, non-tender; bowel sounds normal; no masses,  no organomegaly Extremities: extremities normal, atraumatic, no cyanosis or edema and Homans sign is negative, no sign of DVT Neurologic: Grossly normal   Diagnostic Studies & Laboratory data:         Recent Radiology Findings:     Procedure: CT Chest with IV Contrast  Procedure: CT Abdomen and Pelvis with IV Contrast  Indication: Female, 34 years old. 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  Additional clinical history: History of left lower lobe carcinoid tumor, status post left lower lobe wedge resection December 29, 2017. Continues to have symptoms such as flushing in the neck, hirsutism, loss of taste/smell, "rupture of tympanic membrane"  Comparison: None  Technique: A dual liver protocol CT was performed of the chest, abdomen, and pelvis following the administration of intravenous and oral contrast. Imaging was performed in the arterial phase through the liver, followed by portal venous phase of the chest, abdomen, and pelvis. Iodinated contrast was used due to the indications for the examination, to improve disease detection and to further define anatomy. Coronal and sagittal reformatted images of the abdomen and pelvis were generated and reviewed. 3-D maximum intensity projection (MIP) reconstructions of the chest were performed to potentially increase study sensitivity.  Oral Contrast: Readi-Cat IV Contrast: Isovue 300   FINDINGS:   Chest:   - Thyroid: Normal in appearance.  - Pleura: No pleural effusion.  - Cardiac: No pericardial effusion.  - Pulmonary: Postsurgical changes of left lower lobe wedge resection. Central airways clear. No suspicious pulmonary nodule.  - Nodes: No lymphadenopathy within the chest.  - Vascular: Normal size and configuration of the thoracic aorta and main pulmonary artery.  - Soft tissues: Unremarkable  Abdomen/pelvis:   - Liver: No focal lesions  - Gallbladder: Normal in appearance.  - Spleen: Normal in appearance.  - Pancreas: Normal in appearance.  - Kidneys: Normal in appearance.  - Adrenal glands: Normal in appearance.  - Gastrointestinal: Non-obstructive pattern. No focal lesions.  -  Genitourinary: Unremarkable  - Nodes: No retroperitoneal, mesenteric, or pelvic adenopathy  - Vascular: Unremarkable  - Soft tissues: Unremarkable  Musculoskeletal:  - Bones: Unremarkable   IMPRESSION:  No CT evidence of locally recurrent or metastatic disease.   Please see contemporaneous DOTATATE-PET scan report.  Procedure: Gallium-68 Dotatate PET scan from the skull vertex to the mid thighs.  Indication: Female, 34 years old. 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  Additional clinical history: History of left lower lobe carcinoid tumor, status post left lower lobe wedge resection December 29, 2017. Continues to have symptoms such as flushing in the neck, hirsutism, loss of taste/smell  Comparison: None  Radiopharmaceutical: 5.31 mCi of Ga-68 Dotatate, intravenously. Time from injection to imaging: 60 minutes.  Technique: PET imaging was performed from the skull vertex to the mid thighs using routine PET acquisition following intravenous  administration of Ga-68 Dotatate, per standard protocol. A separate diagnostic CT scan was also performed. Oral and IV contrast were administered as part of the contemporaneous CT of the chest, abdomen, and pelvis in order to improve sensitivity for disease detection.   FINDINGS:  Neck:   - Thyroid: Unremarkable  - Aerodigestive: Unremarkable  - Nodes: Unremarkable  Chest:  - Pleura: Unremarkable  - Cardiac: Unremarkable.  - Pulmonary: Postsurgical changes of left lower lobe with resection. No focal dotatate uptake to suggest locally recurrent disease.  - Nodes: Unremarkable  Abdomen/Pelvis:  - Liver: Unremarkable  - Gallbladder: Unremarkable  - Spleen: Unremarkable  - Pancreas: Unremarkable  - Kidneys: Unremarkable  - Adrenal glands: Unremarkable  - Bowel: Unremarkable  - Genitourinary: Unremarkable  - Nodes: Unremarkable  Musculoskeletal:  - Bones: Unremarkable  - Soft tissues: Unremarkable   IMPRESSION:  No somatostatin receptor avid disease.   Please see contemporaneous CT scan report.   Electronically Reviewed by: Joselyn Arrow, MD, [facility] Electronically Reviewed on: 01/29/2019 3:37 PM  I have reviewed the images and concur with the above findings.  Electronically Signed by: Kyra Leyland, MD, [facility] Electronically Signed on: 01/29/2019 4:27 PM   Recent Labs: Lab Results  Component Value Date   WBC 5.9 12/06/2018   HGB 13.8 12/06/2018   HCT 42.6 12/06/2018   PLT 229 12/06/2018   GLUCOSE 116 (H) 12/06/2018   CHOL 177 03/20/2019   TRIG 436 (H) 03/20/2019   HDL 29 (L) 03/20/2019   LDLCALC 78 03/20/2019   ALT 31 12/06/2018   AST 24 12/06/2018   NA 138 12/06/2018   K 3.7 12/06/2018   CL 111 12/06/2018   CREATININE 0.96 12/06/2018   BUN 16 12/06/2018   CO2 19 (L) 12/06/2018   TSH 2.350 03/20/2019   INR 1.1 12/03/2018      Assessment / Plan:    Stable from a surgical standpoint following left lower lobectomy 2019-awaiting  follow-up scans in August 2021-continues to be followed by medical [facility]ology at [facility].  She will call me when her scans in August are done and then decide on appropriate follow-up appointment.    Grace Isaac 09/19/2019 12:16 PM

## 2020-02-18 ENCOUNTER — Encounter: Payer: Self-pay | Admitting: Cardiothoracic Surgery

## 2020-02-25 ENCOUNTER — Other Ambulatory Visit: Payer: Self-pay

## 2020-03-02 ENCOUNTER — Other Ambulatory Visit: Payer: Self-pay | Admitting: Nurse Practitioner

## 2020-03-02 DIAGNOSIS — C7A00 Malignant carcinoid tumor of unspecified site: Secondary | ICD-10-CM

## 2020-03-10 ENCOUNTER — Ambulatory Visit
Admission: RE | Admit: 2020-03-10 | Discharge: 2020-03-10 | Disposition: A | Discharge status: Home / Self Care | Payer: Managed Care, Other (non HMO) | Source: Ambulatory Visit | Attending: Nurse Practitioner | Admitting: Nurse Practitioner

## 2020-03-18 ENCOUNTER — Other Ambulatory Visit: Payer: Self-pay

## 2020-03-18 ENCOUNTER — Encounter
Admission: RE | Admit: 2020-03-18 | Discharge: 2020-03-18 | Disposition: A | Discharge status: Home / Self Care | Payer: Managed Care, Other (non HMO) | Source: Ambulatory Visit | Attending: Nurse Practitioner | Admitting: Nurse Practitioner

## 2020-03-18 DIAGNOSIS — C7B09 Secondary carcinoid tumors of other sites: Secondary | ICD-10-CM | POA: Insufficient documentation

## 2020-03-18 DIAGNOSIS — C7A00 Malignant carcinoid tumor of unspecified site: Secondary | ICD-10-CM | POA: Insufficient documentation

## 2020-03-18 LAB — GLUCOSE, CAPILLARY: Glucose-Capillary: 83 mg/dL (ref 70–99)

## 2020-03-18 MED ORDER — FLUDEOXYGLUCOSE F - 18 (FDG) INJECTION
11.4000 | Freq: Once | INTRAVENOUS | Status: AC | PRN
  Administered 2020-03-18: 12.02 via INTRAVENOUS

## 2020-03-19 ENCOUNTER — Ambulatory Visit (INDEPENDENT_AMBULATORY_CARE_PROVIDER_SITE_OTHER): Payer: Managed Care, Other (non HMO) | Admitting: Family Medicine

## 2020-03-19 ENCOUNTER — Encounter: Payer: Self-pay | Admitting: Family Medicine

## 2020-03-19 VITALS — BP 135/95 | HR 96 | Temp 98.5°F | Ht 67.5 in | Wt 218.0 lb

## 2020-03-19 DIAGNOSIS — E669 Obesity, unspecified: Secondary | ICD-10-CM | POA: Diagnosis not present

## 2020-03-19 DIAGNOSIS — Z0000 Encounter for general adult medical examination without abnormal findings: Secondary | ICD-10-CM | POA: Diagnosis not present

## 2020-03-19 DIAGNOSIS — C7B09 Secondary carcinoid tumors of other sites: Secondary | ICD-10-CM

## 2020-03-19 DIAGNOSIS — Z23 Encounter for immunization: Secondary | ICD-10-CM

## 2020-03-19 DIAGNOSIS — Z1159 Encounter for screening for other viral diseases: Secondary | ICD-10-CM | POA: Diagnosis not present

## 2020-03-19 DIAGNOSIS — Z6834 Body mass index (BMI) 34.0-34.9, adult: Secondary | ICD-10-CM

## 2020-03-19 DIAGNOSIS — C7A00 Malignant carcinoid tumor of unspecified site: Secondary | ICD-10-CM | POA: Diagnosis not present

## 2020-03-19 NOTE — Assessment & Plan Note (Signed)
S/p resection in 12/2017 Followed by Oncology PET scan yesterday Symptoms incl flushing, hirsutism, BP elevation HCTZ did not help BP, so it was stopped

## 2020-03-19 NOTE — Progress Notes (Signed)
I,Laura E Walsh,acting as a scribe for Lavon Paganini, MD.,have documented all relevant documentation on the behalf of Lavon Paganini, MD,as directed by  Lavon Paganini, MD while in the presence of Lavon Paganini, MD.  Complete physical exam   Patient: Patricia Glenn   DOB: 07-19-1985   34 y.o. Female  MRN: [id number] Visit Date: 03/19/2020  Today's healthcare provider: Lavon Paganini, MD   Chief Complaint  Patient presents with  . Annual Exam   Subjective    Patricia Glenn is a 34 y.o. female who presents today for a complete physical exam.  She reports consuming a general diet. Exercises regularly She generally feels well. She reports sleeping well. She does not have additional problems to discuss today.  HPI    Past Medical History:  Diagnosis Date  . 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  . Encounter for induction of labor 11/17/2017  . Encounter for supervision of other normal pregnancy, unspecified trimester 05/23/2017   34 y.o. G3P2002 at 52w1dby Patient's last menstrual period was 03/13/2017. Estimated Date of Delivery: 12/18/17  c/w 181w1dltrasound Sex of baby and name:  " "  Factors complicating this pregnancy   LLL pneumonia 24 weeks  Inpatient  Repeat CXR 3 weeks post _0   Elevated BP at NOB- watch closely; baseline labs ordered and WNL-05/23/17  Hx of LGA , previous babies weighed 4309 grams and 4139 gra  . 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 Lung mass   . Mass of lower lobe of left lung 11/17/2017  . Pneumonia complicating pregnancy, second trimester 08/29/2017   Past Surgical History:  Procedure Laterality Date  . FLEXIBLE BRONCHOSCOPY N/A 11/29/2017   Procedure: FLEXIBLE BRONCHOSCOPY;  Surgeon: SiWilhelmina McardleMD;  Location: [facility] ORS;  Service: Pulmonary;  Laterality: N/A;  . VIDEO ASSISTED THORACOSCOPY  (VATS)/WEDGE RESECTION Left 12/29/2017   Procedure: Left VIDEO ASSISTED THORACOSCOPY with Mini Thoracotomy, Left Lower Lobe Lobectomy, Node Dissection,  Insertion of  ONQ pain pump;  Surgeon: GeGrace IsaacMD;  [facility]: Thoracic;  Laterality: Left;  . Marland KitchenIDEO BRONCHOSCOPY N/A 12/29/2017   Procedure: VIDEO BRONCHOSCOPY;  Surgeon: GeGrace IsaacMD;  [facility];  Service: Thoracic;  Laterality: N/A;   Social History   Socioeconomic History  . Marital status: Married    Spouse name: Not on file  . Number of children: Not on file  . Years of education: Not on file  . Highest education level: Not on file  Occupational History  . Not on file  Tobacco Use  . Smoking status: Never Smoker  . Smokeless tobacco: Never Used  Vaping Use  . Vaping Use: Never used  Substance and Sexual Activity  . Alcohol use: Yes    Comment: occasional  . Drug use: No  . Sexual activity: Not on file  Other Topics Concern  . Not on file  Social History Narrative    stay home mom-; lives at home; 8 miles from here; no smoking; no second smoking; alcohol ocassional; 2 daughters.    Social Determinants of Health   Financial Resource Strain:   . Difficulty of Paying Living Expenses: Not on file  Food Insecurity:   . Worried About RuCharity fundraisern the Last Year: Not on file  . Ran Out of Food in the Last  Year: Not on file  Transportation Needs:   . Lack of Transportation (Medical): Not on file  . Lack of Transportation (Non-Medical): Not on file  Physical Activity:   . Days of Exercise per Week: Not on file  . Minutes of Exercise per Session: Not on file  Stress:   . Feeling of Stress : Not on file  Social Connections:   . Frequency of Communication with Friends and Family: Not on file  . Frequency of Social Gatherings with Friends and Family: Not on file  . Attends Religious Services: Not on file  . Active Member of Clubs or Organizations: Not on file  . Attends English as a second language teacher Meetings: Not on file  . Marital Status: Not on file  Intimate Partner Violence:   . Fear of Current or Ex-Partner: Not on file  . Emotionally Abused: Not on file  . Physically Abused: Not on file  . Sexually Abused: Not on file   Family Status  Relation Name Status  . MGF  Alive  . Father  (Not Specified)  . MGM  (Not Specified)  . PGM  Deceased  . PGF  Deceased  . Neg Hx  (Not Specified)   Family History  Problem Relation Age of Onset  . Prostate cancer Father 53  . Diabetes Maternal Grandmother   . Parkinson's disease Maternal Grandmother   . Heart disease Paternal Grandmother 6       smoker  . Bladder Cancer Paternal Grandfather 101  . Breast cancer Neg Hx   . Ovarian cancer Neg Hx   . Colon cancer Neg Hx    No Known Allergies  Patient Care Team: Virginia Crews, MD as PCP - General (Family Medicine) Telford Nab, RN as Registered Nurse Leslie Andrea, MD as Referring Physician (Internal Medicine)   Medications: Outpatient Medications Prior to Visit  Medication Sig  . [DISCONTINUED] hydrochlorothiazide (HYDRODIURIL) 25 MG tablet Take 25 mg by mouth daily.   No facility-administered medications prior to visit.    Review of Systems  Constitutional: Negative.   HENT: Negative.   Eyes: Negative.   Respiratory: Negative.   Cardiovascular: Negative.   Gastrointestinal: Negative.   Endocrine: Negative.   Genitourinary: Negative.   Musculoskeletal: Negative.   Skin: Negative.   Allergic/Immunologic: Negative.   Neurological: Negative.   Hematological: Negative.   Psychiatric/Behavioral: Negative.     Last CBC Lab Results  Component Value Date   WBC 5.9 12/06/2018   HGB 13.8 12/06/2018   HCT 42.6 12/06/2018   MCV 81.9 12/06/2018   MCH 26.5 12/06/2018   RDW 14.2 12/06/2018   PLT 229 96/75/9163   Last metabolic panel Lab Results  Component Value Date   GLUCOSE 116 (H) 12/06/2018   NA 138 12/06/2018   K 3.7 12/06/2018   CL 111  12/06/2018   CO2 19 (L) 12/06/2018   BUN 16 12/06/2018   CREATININE 0.96 12/06/2018   GFRNONAA >60 12/06/2018   GFRAA >60 12/06/2018   CALCIUM 8.7 (L) 12/06/2018   PROT 7.7 12/06/2018   ALBUMIN 4.6 12/06/2018   BILITOT 0.3 12/06/2018   ALKPHOS 66 12/06/2018   AST 24 12/06/2018   ALT 31 12/06/2018   ANIONGAP 8 12/06/2018   Last lipids Lab Results  Component Value Date   CHOL 177 03/20/2019   HDL 29 (L) 03/20/2019   LDLCALC 78 03/20/2019   TRIG 436 (H) 03/20/2019   CHOLHDL 6.1 (H) 03/20/2019   Last hemoglobin A1c No results found for: HGBA1C Last  thyroid functions Lab Results  Component Value Date   TSH 2.350 03/20/2019   Last vitamin D No results found for: 25OHVITD2, 25OHVITD3, VD25OH Last vitamin B12 and Folate No results found for: VITAMINB12, FOLATE  Objective    BP (!) 135/95 (BP Location: Left Arm, Patient Position: Sitting, Cuff Size: Large)   Pulse 96   Temp 98.5 F (36.9 C) (Oral)   Ht 5' 7.5" (1.715 m)   Wt 218 lb (98.9 kg)   LMP 02/23/2020 (Exact Date) Comment: Just had miscarriage when she started.  BMI 33.64 kg/m    Physical Exam Vitals and nursing note reviewed.  Constitutional:      General: She is not in acute distress.    Appearance: Normal appearance. She is well-developed. She is not diaphoretic.  HENT:     Head: Normocephalic and atraumatic.     Right Ear: Tympanic membrane, ear canal and external ear normal.     Left Ear: Tympanic membrane, ear canal and external ear normal.     Mouth/Throat:     Pharynx: No oropharyngeal exudate.  Eyes:     General: No scleral icterus.    Conjunctiva/sclera: Conjunctivae normal.     Pupils: Pupils are equal, round, and reactive to light.  Neck:     Thyroid: No thyromegaly.  Cardiovascular:     Rate and Rhythm: Normal rate and regular rhythm.     Pulses: Normal pulses.     Heart sounds: Normal heart sounds. No murmur heard.   Pulmonary:     Effort: Pulmonary effort is normal. No respiratory  distress.     Breath sounds: Normal breath sounds. No wheezing or rales.  Abdominal:     General: There is no distension.     Palpations: Abdomen is soft.     Tenderness: There is no abdominal tenderness.  Musculoskeletal:        General: No deformity.     Cervical back: Neck supple.     Right lower leg: No edema.     Left lower leg: No edema.  Lymphadenopathy:     Cervical: No cervical adenopathy.  Skin:    General: Skin is warm and dry.     Findings: No rash.  Neurological:     Mental Status: She is alert and oriented to person, place, and time. Mental status is at baseline.     Sensory: No sensory deficit.     Motor: No weakness.     Gait: Gait normal.  Psychiatric:        Mood and Affect: Mood normal.        Behavior: Behavior normal.        Thought Content: Thought content normal.       Last depression screening scores PHQ 2/9 Scores 03/19/2020 03/20/2019 08/24/2018  PHQ - 2 Score 0 0 1  PHQ- 9 Score 0 1 3   Last fall risk screening Fall Risk  03/19/2020  Falls in the past year? 0  Number falls in past yr: 0  Injury with Fall? 0  Risk for fall due to : No Fall Risks  Follow up Falls evaluation completed   Last Audit-C alcohol use screening Alcohol Use Disorder Test (AUDIT) 03/19/2020  1. How often do you have a drink containing alcohol? 2  2. How many drinks containing alcohol do you have on a typical day when you are drinking? 0  3. How often do you have six or more drinks on one occasion? 0  AUDIT-C Score 2  Alcohol Brief Interventions/Follow-up AUDIT Score <7 follow-up not indicated   A score of 3 or more in women, and 4 or more in men indicates increased risk for alcohol abuse, EXCEPT if all of the points are from question 1   No results found for any visits on 03/19/20.  Assessment & Plan    Routine Health Maintenance and Physical Exam  Exercise Activities and Dietary recommendations Goals   None     Immunization History  Administered Date(s)  Administered  . Influenza Inj Mdck Quad With Preservative 06/21/2017  . Influenza, Quadrivalent, Recombinant, Inj, Pf 05/01/2018  . Influenza,inj,Quad PF,6+ Mos 03/20/2019  . Tdap 09/02/2014, 10/03/2017    Health Maintenance  Topic Date Due  . Hepatitis C Screening  Never done  . COVID-19 Vaccine (1) Never done  . INFLUENZA VACCINE  01/19/2020  . PAP SMEAR-Modifier  05/25/2022  . TETANUS/TDAP  10/04/2027  . HIV Screening  Completed    Discussed health benefits of physical activity, and encouraged her to engage in regular exercise appropriate for her age and condition.   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[place])    S/p resection in 12/2017 Followed by Oncology PET scan yesterday Symptoms incl flushing, hirsutism, BP elevation HCTZ did not help BP, so it was stopped      Relevant Orders   Comprehensive metabolic panel   Lipid panel   TSH   CBC w/Diff/Platelet     Other   Obesity    Discussed importance of healthy weight management Discussed diet and exercise       Relevant Orders   Comprehensive metabolic panel   Lipid panel   TSH   CBC w/Diff/Platelet    Other Visit Diagnoses    Encounter for annual physical exam    -  Primary   Relevant Orders   Comprehensive metabolic panel   Lipid panel   TSH   CBC w/Diff/Platelet   Hepatitis C Antibody   Need for hepatitis C screening test       Relevant Orders   Hepatitis C Antibody       Return in about 1 year (around 03/19/2021) for CPE.     I, Lavon Paganini, MD, have reviewed all documentation for this visit. The documentation on 03/19/20 for the exam, diagnosis, procedures, and orders are all accurate and complete.   Shavontae Gibeault, Dionne Bucy, MD, MPH [facility]

## 2020-03-19 NOTE — Assessment & Plan Note (Signed)
Discussed importance of healthy weight management Discussed diet and exercise  

## 2020-03-19 NOTE — Addendum Note (Signed)
Addended by: Ashley Royalty E on: 03/19/2020 01:34 PM   Modules accepted: Orders

## 2020-03-19 NOTE — Patient Instructions (Signed)
Preventive Care 21-34 Years Old, Female Preventive care refers to visits with your health care provider and lifestyle choices that can promote health and wellness. This includes:  A yearly physical exam. This may also be called an annual well check.  Regular dental visits and eye exams.  Immunizations.  Screening for certain conditions.  Healthy lifestyle choices, such as eating a healthy diet, getting regular exercise, not using drugs or products that contain nicotine and tobacco, and limiting alcohol use. What can I expect for my preventive care visit? Physical exam Your health care provider will check your:  Height and weight. This may be used to calculate body mass index (BMI), which tells if you are at a healthy weight.  Heart rate and blood pressure.  Skin for abnormal spots. Counseling Your health care provider may ask you questions about your:  Alcohol, tobacco, and drug use.  Emotional well-being.  Home and relationship well-being.  Sexual activity.  Eating habits.  Work and work environment.  Method of birth control.  Menstrual cycle.  Pregnancy history. What immunizations do I need?  Influenza (flu) vaccine  This is recommended every year. Tetanus, diphtheria, and pertussis (Tdap) vaccine  You may need a Td booster every 10 years. Varicella (chickenpox) vaccine  You may need this if you have not been vaccinated. Human papillomavirus (HPV) vaccine  If recommended by your health care provider, you may need three doses over 6 months. Measles, mumps, and rubella (MMR) vaccine  You may need at least one dose of MMR. You may also need a second dose. Meningococcal conjugate (MenACWY) vaccine  One dose is recommended if you are age 19-21 years and a first-year college student living in a residence hall, or if you have one of several medical conditions. You may also need additional booster doses. Pneumococcal conjugate (PCV13) vaccine  You may need  this if you have certain conditions and were not previously vaccinated. Pneumococcal polysaccharide (PPSV23) vaccine  You may need one or two doses if you smoke cigarettes or if you have certain conditions. Hepatitis A vaccine  You may need this if you have certain conditions or if you travel or work in places where you may be exposed to hepatitis A. Hepatitis B vaccine  You may need this if you have certain conditions or if you travel or work in places where you may be exposed to hepatitis B. Haemophilus influenzae type b (Hib) vaccine  You may need this if you have certain conditions. You may receive vaccines as individual doses or as more than one vaccine together in one shot (combination vaccines). Talk with your health care provider about the risks and benefits of combination vaccines. What tests do I need?  Blood tests  Lipid and cholesterol levels. These may be checked every 5 years starting at age 20.  Hepatitis C test.  Hepatitis B test. Screening  Diabetes screening. This is done by checking your blood sugar (glucose) after you have not eaten for a while (fasting).  Sexually transmitted disease (STD) testing.  BRCA-related cancer screening. This may be done if you have a family history of breast, ovarian, tubal, or peritoneal cancers.  Pelvic exam and Pap test. This may be done every 3 years starting at age 21. Starting at age 30, this may be done every 5 years if you have a Pap test in combination with an HPV test. Talk with your health care provider about your test results, treatment options, and if necessary, the need for more tests.   Follow these instructions at home: Eating and drinking   Eat a diet that includes fresh fruits and vegetables, whole grains, lean protein, and low-fat dairy.  Take vitamin and mineral supplements as recommended by your health care provider.  Do not drink alcohol if: ? Your health care provider tells you not to drink. ? You are  pregnant, may be pregnant, or are planning to become pregnant.  If you drink alcohol: ? Limit how much you have to 0-1 drink a day. ? Be aware of how much alcohol is in your drink. In the [place] one drink equals one 12 oz bottle of beer (355 mL), one 5 oz glass of wine (148 mL), or one 1 oz glass of hard liquor (44 mL). Lifestyle  Take daily care of your teeth and gums.  Stay active. Exercise for at least 30 minutes on 5 or more days each week.  Do not use any products that contain nicotine or tobacco, such as cigarettes, e-cigarettes, and chewing tobacco. If you need help quitting, ask your health care provider.  If you are sexually active, practice safe sex. Use a condom or other form of birth control (contraception) in order to prevent pregnancy and STIs (sexually transmitted infections). If you plan to become pregnant, see your health care provider for a preconception visit. What's next?  Visit your health care provider once a year for a well check visit.  Ask your health care provider how often you should have your eyes and teeth checked.  Stay up to date on all vaccines. This information is not intended to replace advice given to you by your health care provider. Make sure you discuss any questions you have with your health care provider. Document Revised: 02/15/2018 Document Reviewed: 02/15/2018 Elsevier Patient Education  2020 [employer].

## 2020-04-16 LAB — COMPREHENSIVE METABOLIC PANEL
ALT: 25 IU/L (ref 0–32)
AST: 18 IU/L (ref 0–40)
Albumin/Globulin Ratio: 1.5 (ref 1.2–2.2)
Albumin: 4.4 g/dL (ref 3.8–4.8)
Alkaline Phosphatase: 74 IU/L (ref 44–121)
BUN/Creatinine Ratio: 13 (ref 9–23)
BUN: 11 mg/dL (ref 6–20)
Bilirubin Total: 0.2 mg/dL (ref 0.0–1.2)
CO2: 21 mmol/L (ref 20–29)
Calcium: 9.2 mg/dL (ref 8.7–10.2)
Chloride: 103 mmol/L (ref 96–106)
Creatinine, Ser: 0.85 mg/dL (ref 0.57–1.00)
GFR calc Af Amer: 103 mL/min/{1.73_m2} (ref >59)
GFR calc non Af Amer: 90 mL/min/{1.73_m2} (ref >59)
Globulin, Total: 3 g/dL (ref 1.5–4.5)
Glucose: 97 mg/dL (ref 65–99)
Potassium: 3.9 mmol/L (ref 3.5–5.2)
Sodium: 138 mmol/L (ref 134–144)
Total Protein: 7.4 g/dL (ref 6.0–8.5)

## 2020-04-16 LAB — LIPID PANEL
Chol/HDL Ratio: 5.2 ratio — ABNORMAL HIGH (ref 0.0–4.4)
Cholesterol, Total: 160 mg/dL (ref 100–199)
HDL: 31 mg/dL — ABNORMAL LOW (ref >39)
LDL Chol Calc (NIH): 93 mg/dL (ref 0–99)
Triglycerides: 208 mg/dL — ABNORMAL HIGH (ref 0–149)
VLDL Cholesterol Cal: 36 mg/dL (ref 5–40)

## 2020-04-16 LAB — CBC WITH DIFFERENTIAL/PLATELET
Basophils Absolute: 0.1 10*3/uL (ref 0.0–0.2)
Basos: 1 %
EOS (ABSOLUTE): 0.2 10*3/uL (ref 0.0–0.4)
Eos: 2 %
Hematocrit: 39.8 % (ref 34.0–46.6)
Hemoglobin: 12.7 g/dL (ref 11.1–15.9)
Immature Grans (Abs): 0 10*3/uL (ref 0.0–0.1)
Immature Granulocytes: 0 %
Lymphocytes Absolute: 2.3 10*3/uL (ref 0.7–3.1)
Lymphs: 28 %
MCH: 24.8 pg — ABNORMAL LOW (ref 26.6–33.0)
MCHC: 31.9 g/dL (ref 31.5–35.7)
MCV: 78 fL — ABNORMAL LOW (ref 79–97)
Monocytes Absolute: 0.4 10*3/uL (ref 0.1–0.9)
Monocytes: 5 %
Neutrophils Absolute: 5.3 10*3/uL (ref 1.4–7.0)
Neutrophils: 64 %
Platelets: 311 10*3/uL (ref 150–450)
RBC: 5.13 x10E6/uL (ref 3.77–5.28)
RDW: 14.2 % (ref 11.7–15.4)
WBC: 8.2 10*3/uL (ref 3.4–10.8)

## 2020-04-16 LAB — TSH: TSH: 2.2 u[IU]/mL (ref 0.450–4.500)

## 2020-04-16 LAB — HEPATITIS C ANTIBODY: Hep C Virus Ab: <0.1 s/co ratio (ref 0.0–0.9)

## 2020-04-20 ENCOUNTER — Telehealth: Payer: Self-pay

## 2020-04-20 NOTE — Telephone Encounter (Signed)
Written by Virginia Crews, MD on 04/16/2020 8:15 AM EDT Seen by patient Patricia Glenn on 04/19/2020 11:02 AM

## 2020-04-20 NOTE — Telephone Encounter (Signed)
-----   Message from Virginia Crews, MD sent at 04/16/2020  8:15 AM EDT ----- Normal labs

## 2020-07-15 ENCOUNTER — Ambulatory Visit (INDEPENDENT_AMBULATORY_CARE_PROVIDER_SITE_OTHER): Payer: Managed Care, Other (non HMO) | Admitting: Dermatology

## 2020-07-15 ENCOUNTER — Encounter: Payer: Self-pay | Admitting: Dermatology

## 2020-07-15 ENCOUNTER — Other Ambulatory Visit: Payer: Self-pay

## 2020-07-15 DIAGNOSIS — D2262 Melanocytic nevi of left upper limb, including shoulder: Secondary | ICD-10-CM

## 2020-07-15 DIAGNOSIS — L719 Rosacea, unspecified: Secondary | ICD-10-CM

## 2020-07-15 DIAGNOSIS — D229 Melanocytic nevi, unspecified: Secondary | ICD-10-CM

## 2020-07-15 DIAGNOSIS — D1800 Hemangioma unspecified site: Secondary | ICD-10-CM

## 2020-07-15 DIAGNOSIS — Z1283 Encounter for screening for malignant neoplasm of skin: Secondary | ICD-10-CM

## 2020-07-15 DIAGNOSIS — D2272 Melanocytic nevi of left lower limb, including hip: Secondary | ICD-10-CM

## 2020-07-15 DIAGNOSIS — L578 Other skin changes due to chronic exposure to nonionizing radiation: Secondary | ICD-10-CM

## 2020-07-15 DIAGNOSIS — D225 Melanocytic nevi of trunk: Secondary | ICD-10-CM

## 2020-07-15 DIAGNOSIS — D369 Benign neoplasm, unspecified site: Secondary | ICD-10-CM

## 2020-07-15 DIAGNOSIS — D485 Neoplasm of uncertain behavior of skin: Secondary | ICD-10-CM

## 2020-07-15 DIAGNOSIS — L821 Other seborrheic keratosis: Secondary | ICD-10-CM

## 2020-07-15 DIAGNOSIS — Z86018 Personal history of other benign neoplasm: Secondary | ICD-10-CM

## 2020-07-15 DIAGNOSIS — L814 Other melanin hyperpigmentation: Secondary | ICD-10-CM

## 2020-07-15 NOTE — Progress Notes (Signed)
Follow-Up Visit   Subjective  Patricia Glenn is a 35 y.o. female who presents for the following: Follow-up (Patient here today to have [place] at left sup shoulder rechecked. She also has a few spots at left thigh and some at nose she would like checked. ).  Patient here for full body skin exam and skin cancer screening.  The following portions of the chart were reviewed this encounter and updated as appropriate:   Tobacco  Allergies  Meds  Problems  Med Hx  Surg Hx  Fam Hx      Review of Systems:  No other skin or systemic complaints except as noted in HPI or Assessment and Plan.  Objective  Well appearing patient in no apparent distress; mood and affect are within normal limits.  A full examination was performed including scalp, head, eyes, ears, nose, lips, neck, chest, axillae, abdomen, back, buttocks, bilateral upper extremities, bilateral lower extremities, hands, feet, fingers, toes, fingernails, and toenails. All findings within normal limits unless otherwise noted below.  Objective  Left Superior Shoulder: 0.1cm brown macule within scar  Objective  Left back left of midline: 0.3cm light and dark brown thin papule  Objective  Left Thigh: 0.6cm thin tan papule  Objective  Nose: Multiple 1-61mm erythematous tan papules  Objective  Face: erythema   Assessment & Plan  Neoplasm of uncertain behavior of skin (2) Left Superior Shoulder  Epidermal / dermal shaving  Lesion diameter (cm):  0.1 Informed consent: discussed and consent obtained   Timeout: patient name, date of birth, surgical site, and procedure verified   Patient was prepped and draped in usual sterile fashion: area prepped with isopropyl alcohol. Anesthesia: the lesion was anesthetized in a standard fashion   Anesthetic:  1% lidocaine w/ epinephrine 1-100,000 buffered w/ 8.4% NaHCO3 Instrument used: flexible razor blade   Hemostasis achieved with:  aluminum chloride   Outcome: patient tolerated procedure well   Post-procedure details: wound care instructions given   Additional details:  Mupirocin and a bandage applied  Specimen 1 - Surgical pathology Differential Diagnosis: r/o Atypia vs recurrent nevus  Check Margins: No 0.1cm brown macule within scar [id number]  Left back left of midline  Epidermal / dermal shaving  Lesion diameter (cm):  0.3 Informed consent: discussed and consent obtained   Timeout: patient name, date of birth, surgical site, and procedure verified   Patient was prepped and draped in usual sterile fashion: area prepped with isopropyl alcohol. Anesthesia: the lesion was anesthetized in a standard fashion   Anesthetic:  1% lidocaine w/ epinephrine 1-100,000 buffered w/ 8.4% NaHCO3 Instrument used: flexible razor blade   Hemostasis achieved with: aluminum chloride   Outcome: patient tolerated procedure well   Post-procedure details: wound care instructions given   Additional details:  Mupirocin and a bandage applied  Specimen 2 - Surgical pathology Differential Diagnosis: r/o Atypia  Check Margins: No 0.3cm light and dark brown thin papule  Nevus Left Thigh  Benign-appearing.  Observation.  Call clinic for new or changing lesions.  Recommend daily use of broad spectrum spf 30+ sunscreen to sun-exposed areas.    Angiofibroma Nose  Benign-appearing.  Observation.  Call clinic for new or changing lesions.  Recommend daily use of broad spectrum spf 30+ sunscreen to sun-exposed areas.    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  - Call  if any new or changing lesions are noted between office visits  Lentigines - Scattered tan macules - Discussed due to sun exposure - Benign, observe - Call for any changes  Seborrheic Keratoses - Stuck-on, waxy, tan-brown papules and plaques  - Discussed benign etiology and prognosis. - Observe - Call for any changes  Melanocytic Nevi - Tan-brown and/or pink-flesh-colored symmetric macules and papules - Benign appearing on exam today - Observation - Call clinic for new or changing moles - Recommend daily use of broad spectrum spf 30+ sunscreen to sun-exposed areas.   Hemangiomas - Red papules - Discussed benign nature - Observe - Call for any changes  Actinic Damage - Chronic, secondary to cumulative UV/sun exposure - diffuse scaly erythematous macules with underlying dyspigmentation - Recommend daily broad spectrum sunscreen SPF 30+ to sun-exposed areas, reapply every 2 hours as needed.  - Call for new or changing lesions.  Skin cancer screening performed today.  Return in about 1 year (around 07/15/2021) for TBSE.  Graciella Belton, RMA, am acting as scribe for Forest Gleason, MD .  Documentation: I have reviewed the above documentation for accuracy and completeness, and I agree with the above.  Forest Gleason, MD

## 2020-07-15 NOTE — Patient Instructions (Signed)
Melanoma ABCDEs  Melanoma is the most dangerous type of skin cancer, and is the leading cause of death from skin disease.  You are more likely to develop melanoma if you:  Have light-colored skin, light-colored eyes, or red or blond hair  Spend a lot of time in the sun  Tan regularly, either outdoors or in a tanning bed  Have had blistering sunburns, especially during childhood  Have a close family member who has had a melanoma  Have atypical moles or large birthmarks  Early detection of melanoma is key since treatment is typically straightforward and cure rates are extremely high if we catch it early.   The first sign of melanoma is often a change in a mole or a new dark spot.  The ABCDE system is a way of remembering the signs of melanoma.  A for asymmetry:  The two halves do not match. B for border:  The edges of the growth are irregular. C for color:  A mixture of colors are present instead of an even brown color. D for diameter:  Melanomas are usually (but not always) greater than 6mm - the size of a pencil eraser. E for evolution:  The spot keeps changing in size, shape, and color.  Please check your skin once per month between visits. You can use a small mirror in front and a large mirror behind you to keep an eye on the back side or your body.   If you see any new or changing lesions before your next follow-up, please call to schedule a visit.  Please continue daily skin protection including broad spectrum sunscreen SPF 30+ to sun-exposed areas, reapplying every 2 hours as needed when you're outdoors.   Wound Care Instructions  1. Cleanse wound gently with soap and water once a day then pat dry with clean gauze. Apply a thing coat of Petrolatum (petroleum jelly, "Vaseline") over the wound (unless you have an allergy to this). We recommend that you use a new, sterile tube of Vaseline. Do not pick or remove scabs. Do not remove the yellow or white "healing tissue" from the  base of the wound.  2. Cover the wound with fresh, clean, nonstick gauze and secure with paper tape. You may use Band-Aids in place of gauze and tape if the would is small enough, but would recommend trimming much of the tape off as there is often too much. Sometimes Band-Aids can irritate the skin.  3. You should call the office for your biopsy report after 1 week if you have not already been contacted.  4. If you experience any problems, such as abnormal amounts of bleeding, swelling, significant bruising, significant pain, or evidence of infection, please call the office immediately.  5. FOR ADULT SURGERY PATIENTS: If you need something for pain relief you may take 1 extra strength Tylenol (acetaminophen) AND 2 Ibuprofen (200mg each) together every 4 hours as needed for pain. (do not take these if you are allergic to them or if you have a reason you should not take them.) Typically, you may only need pain medication for 1 to 3 days.      

## 2020-07-16 ENCOUNTER — Encounter: Payer: Self-pay | Admitting: Dermatology

## 2020-08-11 ENCOUNTER — Other Ambulatory Visit: Payer: Self-pay

## 2020-08-11 DIAGNOSIS — Z85858 Personal history of malignant neoplasm of other endocrine glands: Secondary | ICD-10-CM | POA: Insufficient documentation

## 2020-08-11 DIAGNOSIS — M545 Low back pain, unspecified: Secondary | ICD-10-CM | POA: Diagnosis not present

## 2020-08-11 LAB — CBC
HCT: 39.6 % (ref 36.0–46.0)
Hemoglobin: 12.9 g/dL (ref 12.0–15.0)
MCH: 26.4 pg (ref 26.0–34.0)
MCHC: 32.6 g/dL (ref 30.0–36.0)
MCV: 81.1 fL (ref 80.0–100.0)
Platelets: 305 10*3/uL (ref 150–400)
RBC: 4.88 MIL/uL (ref 3.87–5.11)
RDW: 15.8 % — ABNORMAL HIGH (ref 11.5–15.5)
WBC: 8.7 10*3/uL (ref 4.0–10.5)
nRBC: 0 % (ref 0.0–0.2)

## 2020-08-11 LAB — COMPREHENSIVE METABOLIC PANEL
ALT: 15 U/L (ref 0–44)
AST: 19 U/L (ref 15–41)
Albumin: 4.1 g/dL (ref 3.5–5.0)
Alkaline Phosphatase: 50 U/L (ref 38–126)
Anion gap: 8 (ref 5–15)
BUN: 12 mg/dL (ref 6–20)
CO2: 23 mmol/L (ref 22–32)
Calcium: 8.6 mg/dL — ABNORMAL LOW (ref 8.9–10.3)
Chloride: 108 mmol/L (ref 98–111)
Creatinine, Ser: 0.87 mg/dL (ref 0.44–1.00)
GFR, Estimated: >60 mL/min (ref >60)
Glucose, Bld: 137 mg/dL — ABNORMAL HIGH (ref 70–99)
Potassium: 3.7 mmol/L (ref 3.5–5.1)
Sodium: 139 mmol/L (ref 135–145)
Total Bilirubin: 0.4 mg/dL (ref 0.3–1.2)
Total Protein: 7.6 g/dL (ref 6.5–8.1)

## 2020-08-11 LAB — LIPASE, BLOOD: Lipase: 38 U/L (ref 11–51)

## 2020-08-11 NOTE — ED Triage Notes (Signed)
Pt states she had a severe onset of back pain this evening and then n/v. Pt unable to keep anything down since. Pt states 2 years ago she had pneumonia and her lung collapsed and this felt the same.

## 2020-08-12 ENCOUNTER — Encounter: Payer: Self-pay | Admitting: Family Medicine

## 2020-08-12 ENCOUNTER — Emergency Department
Admission: EM | Admit: 2020-08-12 | Discharge: 2020-08-12 | Disposition: A | Discharge status: Home / Self Care | Payer: Managed Care, Other (non HMO) | Attending: Emergency Medicine | Admitting: Emergency Medicine

## 2020-08-12 DIAGNOSIS — M545 Low back pain, unspecified: Secondary | ICD-10-CM

## 2020-08-12 LAB — URINALYSIS, COMPLETE (UACMP) WITH MICROSCOPIC
Bilirubin Urine: NEGATIVE
Glucose, UA: NEGATIVE mg/dL
Ketones, ur: NEGATIVE mg/dL
Leukocytes,Ua: NEGATIVE
Nitrite: NEGATIVE
Protein, ur: NEGATIVE mg/dL
Specific Gravity, Urine: 1.021 (ref 1.005–1.030)
Squamous Epithelial / HPF: NONE SEEN (ref 0–5)
pH: 6 (ref 5.0–8.0)

## 2020-08-12 LAB — POC URINE PREG, ED: Preg Test, Ur: NEGATIVE

## 2020-08-12 NOTE — ED Provider Notes (Signed)
[facility] Emergency Department Provider Note ____________________________________________   Event Date/Time   First MD Initiated Contact with Patient 08/12/20 [phone]     (approximate)  I have reviewed the triage vital signs and the nursing notes.  HISTORY  Chief Complaint Back Pain and Emesis   HPI Patricia Glenn is a 35 y.o. femalewho presents to the ED for evaluation of back pain and emesis.  Chart review indicates G4, P3 A1 on hormonal birth control. 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  S/p VATS with left lower lobe pulmonary wedge resection in 2019.  Patient self-reports a history of chronic right-sided lower back pain.  She reports intermittent pain that is atraumatic to her right-sided lower back over the past 1-1.5 years.  She reports the pain is transient typically, sharp and improved with home ibuprofen.  Patient reports being out to dinner this evening with friends, seated in an uncomfortable chair, when she developed progressively worsening acute on chronic right-sided lumbar back pain.  She denies any falls, trauma or injuries to the area.  She reports significant pain, of a similar quality to her chronic pain, but of a more severe intensity up to 10/10 intensity.  She reports due to the pain, she had 1 episode of nonbloody nonbilious emesis this evening.  She denies any abdominal pain, diarrhea, dysuria, vaginal bleeding or discharge, syncope, chest pain, shortness of breath or palpitations.  Patient reports taking ibuprofen at home after this vomiting episode, and by the time that I see her she reports feeling much better.  Currently reporting 3/10 intensity right-sided aching lumbar back pain that is chronic without acute features.  She voided about 20 minutes prior to my conversation without any new incontinence, retention of stool or urine.  Denies saddle anesthesias.  She reports  ambulating at her baseline.  Past Medical History:  Diagnosis Date  . Atypical mole 07/19/2018   DYSPLASTIC COMPOUND NEVUS WITH MODERATE ATYPIA  . 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  . Encounter for induction of labor 11/17/2017  . Encounter for supervision of other normal pregnancy, unspecified trimester 05/23/2017   35 y.o. G3P2002 at 28w1dby Patient's last menstrual period was 03/13/2017. Estimated Date of Delivery: 12/18/17  c/w 170w1dltrasound Sex of baby and name:  " "  Factors complicating this pregnancy   LLL pneumonia 24 weeks  Inpatient  Repeat CXR 3 weeks post _0   Elevated BP at NOB- watch closely; baseline labs ordered and WNL-05/23/17  Hx of LGA , previous babies weighed 4309 grams and 4139 gra  . 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 Lung mass   . Mass of lower lobe of left lung 11/17/2017  . Pneumonia complicating pregnancy, second trimester 08/29/2017    Patient Active Problem List   Diagnosis Date Noted  . Hirsutism 08/24/2018  . Obesity 08/24/2018  . 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    Past Surgical History:  Procedure Laterality Date  . FLEXIBLE BRONCHOSCOPY N/A 11/29/2017   Procedure: FLEXIBLE BRONCHOSCOPY;  Surgeon: SiWilhelmina McardleMD;  Location: [facility] ORS;  Service: Pulmonary;  Laterality: N/A;  . VIDEO ASSISTED THORACOSCOPY (VATS)/WEDGE RESECTION Left 12/29/2017   Procedure: Left VIDEO ASSISTED THORACOSCOPY with Mini Thoracotomy, Left Lower Lobe Lobectomy, Node Dissection,  Insertion of  ONQ  pain pump;  Surgeon: Grace Isaac, MD;  Location: [facility];  Service: Thoracic;  Laterality: Left;  Marland Kitchen VIDEO BRONCHOSCOPY N/A 12/29/2017   Procedure: VIDEO BRONCHOSCOPY;  Surgeon: Grace Isaac, MD;  Location: [facility] OR;  Service: Thoracic;  Laterality: N/A;    Prior to Admission medications   Not on  File    Allergies Patient has no known allergies.  Family History  Problem Relation Age of Onset  . Prostate cancer Father 66  . Diabetes Maternal Grandmother   . Parkinson's disease Maternal Grandmother   . Heart disease Paternal Grandmother 45       smoker  . Bladder Cancer Paternal Grandfather 77  . Breast cancer Neg Hx   . Ovarian cancer Neg Hx   . Colon cancer Neg Hx     Social History Social History   Tobacco Use  . Smoking status: Never Smoker  . Smokeless tobacco: Never Used  Vaping Use  . Vaping Use: Never used  Substance Use Topics  . Alcohol use: Yes    Comment: occasional  . Drug use: No    Review of Systems  Constitutional: No fever/chills Eyes: No visual changes. ENT: No sore throat. Cardiovascular: Denies chest pain. Respiratory: Denies shortness of breath. Gastrointestinal: No abdominal pain.  No nausea, no vomiting.  No diarrhea.  No constipation. Genitourinary: Negative for dysuria. Musculoskeletal: Positive for acute on chronic atraumatic lumbar pain. Skin: Negative for rash. Neurological: Negative for headaches, focal weakness or numbness.  ____________________________________________   PHYSICAL EXAM:  VITAL SIGNS: Vitals:   08/11/20 2258  BP: (!) 166/96  Pulse: 87  Resp: 16  Temp: 98.7 F (37.1 C)  SpO2: 99%     Constitutional: Alert and oriented. Well appearing and in no acute distress.  Pleasant and conversational in full sentences.  Sitting up in a bedside chair when I enter the room.  Freely and independently gets up from his chair and ambulates around the room without distress. Eyes: Conjunctivae are normal. PERRL. EOMI. Head: Atraumatic. Nose: No congestion/rhinnorhea. Mouth/Throat: Mucous membranes are moist.  Oropharynx non-erythematous. Neck: No stridor. No cervical spine tenderness to palpation. Cardiovascular: Normal rate, regular rhythm. Grossly normal heart sounds.  Good peripheral circulation. Respiratory: Normal  respiratory effort.  No retractions. Lungs CTAB. Gastrointestinal: Soft , nondistended, nontender to palpation. No CVA tenderness.  Benign abdomen throughout. Musculoskeletal: No lower extremity tenderness nor edema.  No joint effusions. No signs of acute trauma. Mild and poorly localizing right-sided paraspinal lumbar tenderness to palpation without overlying skin changes or signs of trauma.   Nontender midline spine throughout without step-offs. Neurologic:  Normal speech and language. No gross focal neurologic deficits are appreciated. No gait instability noted. Skin:  Skin is warm, dry and intact. No rash noted. Psychiatric: Mood and affect are normal. Speech and behavior are normal.  ____________________________________________   LABS (all labs ordered are listed, but only abnormal results are displayed)  Labs Reviewed  COMPREHENSIVE METABOLIC PANEL - Abnormal; Notable for the following components:      Result Value   Glucose, Bld 137 (*)    Calcium 8.6 (*)    All other components within normal limits  CBC - Abnormal; Notable for the following components:   RDW 15.8 (*)    All other components within normal limits  URINALYSIS, COMPLETE (UACMP) WITH MICROSCOPIC - Abnormal; Notable for the following components:   Color, Urine YELLOW (*)    APPearance CLEAR (*)    Hgb urine dipstick MODERATE (*)  Bacteria, UA RARE (*)    All other components within normal limits  LIPASE, BLOOD  POC URINE PREG, ED    ____________________________________________   MDM / ED COURSE   35 year old woman with history of chronic lower back pain presents to the ED with atraumatic acute on chronic pain, without evidence of acute pathology, and amenable to outpatient management.  Mildly hypertensive, otherwise normal vitals on room air.  Exam is quite reassuring without evidence of any acute derangements.  She has some very mild tenderness over her right-sided paraspinal lumbar musculature, but no  signs of overlying trauma, skin changes, rash or cellulitis.  No red flag features for cord compression.  No signs of trauma to the spine to necessitate imaging.  Not pregnant, blood work unremarkable and urine without infectious features.  Considering her resolving pain with home ibuprofen and her reassuring examination, as well as a benign story without trauma.  I suspect that she is suitable for outpatient management.  We discussed return precautions for the ED.      ____________________________________________   FINAL CLINICAL IMPRESSION(S) / ED DIAGNOSES  Final diagnoses:  Acute right-sided low back pain without sciatica     ED Discharge Orders    None       Noah Lembke   Note:  This document was prepared using Dragon voice recognition software and may include unintentional dictation errors.   Vladimir Crofts, MD 08/12/20 [phone]

## 2020-08-12 NOTE — Discharge Instructions (Signed)
Please take Tylenol and ibuprofen/Advil for your pain.  It is safe to take them together, or to alternate them every few hours.  Take up to 1000mg of Tylenol at a time, up to 4 times per day.  Do not take more than 4000 mg of Tylenol in 24 hours.  For ibuprofen, take 400-600 mg, 4-5 times per day. ° ° °

## 2020-08-12 NOTE — ED Notes (Addendum)
Unable to obtain discharge vitals as pt was in hurry to leave.

## 2020-08-13 NOTE — Telephone Encounter (Signed)
Yes please

## 2020-08-14 ENCOUNTER — Encounter: Payer: Self-pay | Admitting: Family Medicine

## 2020-08-14 ENCOUNTER — Other Ambulatory Visit: Payer: Self-pay

## 2020-08-14 ENCOUNTER — Ambulatory Visit
Admission: RE | Admit: 2020-08-14 | Discharge: 2020-08-14 | Disposition: A | Discharge status: Home / Self Care | Payer: Managed Care, Other (non HMO) | Source: Ambulatory Visit | Attending: Family Medicine | Admitting: Family Medicine

## 2020-08-14 ENCOUNTER — Ambulatory Visit (INDEPENDENT_AMBULATORY_CARE_PROVIDER_SITE_OTHER): Payer: Managed Care, Other (non HMO) | Admitting: Family Medicine

## 2020-08-14 VITALS — BP 148/104 | HR 82 | Temp 98.1°F | Resp 16 | Wt 221.0 lb

## 2020-08-14 DIAGNOSIS — M546 Pain in thoracic spine: Secondary | ICD-10-CM | POA: Diagnosis not present

## 2020-08-14 DIAGNOSIS — R3129 Other microscopic hematuria: Secondary | ICD-10-CM

## 2020-08-14 LAB — POCT URINALYSIS DIPSTICK
Bilirubin, UA: NEGATIVE
Glucose, UA: NEGATIVE
Ketones, UA: NEGATIVE
Leukocytes, UA: NEGATIVE
Nitrite, UA: NEGATIVE
Protein, UA: NEGATIVE
Spec Grav, UA: 1.025 (ref 1.010–1.025)
Urobilinogen, UA: 0.2 E.U./dL
pH, UA: 5 (ref 5.0–8.0)

## 2020-08-14 NOTE — Progress Notes (Signed)
Established patient visit   Patient: Patricia Glenn   DOB: 27-May-1986   35 y.o. Female  MRN: [id number] Visit Date: 08/14/2020  Today's healthcare provider: Lavon Paganini, MD   Chief Complaint  Patient presents with  . Back Pain   Subjective    HPI  Follow up ER visit  Patient was seen in ER for lower right back pain on 08/12/20. She was treated for acute right sided low back pain without sciatica Treatment for this included labs and urinalysis, no medications prescribed She reports this condition is Improved.   Pain was acutely worsened from chronic midline back pain and to the R side.  UA showed leuks and 6-10 RBCs per hpf. She denies any frequency, urgency, dysuria, frank hematuria, fever, abd pain. -----------------------------------------------------------------------------------------   Patient Active Problem List   Diagnosis Date Noted  . Hirsutism 08/24/2018  . Obesity 08/24/2018  . 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 Smoking status: Never Smoker  . Smokeless tobacco: Never Used  Vaping Use  . Vaping Use: Never used  Substance Use Topics  . Alcohol use: Yes    Comment: occasional  . Drug use: No   No Known Allergies     Medications: Outpatient Medications Prior to Visit  Medication Sig  . levonorgestrel-ethinyl estradiol (SEASONALE) 0.15-0.03 MG tablet Take 1 tablet by mouth daily.  . Multiple Vitamin (MULTI-VITAMIN DAILY PO) Take 1 tablet by mouth daily.   No facility-administered medications prior to visit.    Review of Systems  Constitutional: Negative for appetite change, chills and fever.  Respiratory: Negative for cough and shortness of breath.   Cardiovascular: Negative for chest pain and palpitations.  Gastrointestinal: Negative for abdominal pain.  Genitourinary: Negative for dysuria and frequency.  Musculoskeletal: Positive for back pain and myalgias.     Last CBC Lab Results  Component Value Date   WBC 8.7 08/11/2020   HGB 12.9 08/11/2020   HCT 39.6 08/11/2020   MCV 81.1 08/11/2020   MCH 26.4 08/11/2020   RDW 15.8 (H) 08/11/2020   PLT 305 60/03/9322   Last metabolic panel Lab Results  Component Value Date   GLUCOSE 137 (H) 08/11/2020   NA 139 08/11/2020   K 3.7 08/11/2020   CL 108 08/11/2020   CO2 23 08/11/2020   BUN 12 08/11/2020   CREATININE 0.87 08/11/2020   GFRNONAA >60 08/11/2020   GFRAA 103 04/15/2020   CALCIUM 8.6 (L) 08/11/2020   PROT 7.6 08/11/2020   ALBUMIN 4.1 08/11/2020   LABGLOB 3.0 04/15/2020   AGRATIO 1.5 04/15/2020   BILITOT 0.4 08/11/2020   ALKPHOS 50 08/11/2020   AST 19 08/11/2020   ALT 15 08/11/2020   ANIONGAP 8 08/11/2020        Objective    BP (!) 148/104 (BP Location: Left Arm, Patient Position: Sitting, Cuff Size: Large)   Pulse 82   Temp 98.1 F (36.7 C) (Oral)   Resp 16   Wt 221 lb (100.2 kg)   SpO2 100%   BMI 33.60 kg/m  BP Readings from Last 3 Encounters:  08/14/20 (!) 148/104  08/11/20 (!) 166/96  03/19/20 (!) 135/95   Wt Readings from Last 3 Encounters:  08/14/20 221 lb (100.2 kg)  08/11/20 215 lb (97.5 kg)  03/19/20 218 lb (98.9 kg)      Physical Exam Vitals reviewed.  Constitutional:      General: She is not in acute  distress.    Appearance: Normal appearance. She is well-developed. She is not diaphoretic.  HENT:     Head: Normocephalic and atraumatic.  Eyes:     General: No scleral icterus.    Conjunctiva/sclera: Conjunctivae normal.  Neck:     Thyroid: No thyromegaly.  Cardiovascular:     Rate and Rhythm: Normal rate and regular rhythm.     Pulses: Normal pulses.     Heart sounds: Normal heart sounds. No murmur heard.   Pulmonary:     Effort: Pulmonary effort is normal. No respiratory distress.     Breath sounds: Normal breath sounds. No wheezing, rhonchi or rales.  Musculoskeletal:     Cervical back: Neck supple.     Right lower leg: No edema.      Left lower leg: No edema.     Comments: Back: No TTP on exam. Neg CVAT.  Lymphadenopathy:     Cervical: No cervical adenopathy.  Skin:    General: Skin is warm and dry.     Findings: No rash.  Neurological:     Mental Status: She is alert and oriented to person, place, and time. Mental status is at baseline.  Psychiatric:        Mood and Affect: Mood normal.        Behavior: Behavior normal.       Results for orders placed or performed in visit on 08/14/20  POCT urinalysis dipstick  Result Value Ref Range   Color, UA yellow    Clarity, UA clear    Glucose, UA Negative Negative   Bilirubin, UA Negative    Ketones, UA Negative    Spec Grav, UA 1.025 1.010 - 1.025   Blood, UA Moderate    pH, UA 5.0 5.0 - 8.0   Protein, UA Negative Negative   Urobilinogen, UA 0.2 0.2 or 1.0 E.U./dL   Nitrite, UA Negative    Leukocytes, UA Negative Negative    Assessment & Plan     1. Acute right-sided thoracic back pain 2. Other microscopic hematuria - new acute thoracic back pain to the R of midline with new hematuria - no signs/symptoms of UTI, but will send culture - get CT renal stone study to r/o kidney stone - may need urology referral for renal cyst seen on PET scan and if renal stone shows on imaging. - continue tylenol prn in the meantime - return precautions discussed - CT RENAL STONE STUDY; Future - Urine Microscopic - Urine Culture   Return if symptoms worsen or fail to improve.      I, Lavon Paganini, MD, have reviewed all documentation for this visit. The documentation on 08/14/20 for the exam, diagnosis, procedures, and orders are all accurate and complete.   Elgene Coral, Dionne Bucy, MD, MPH [facility]

## 2020-08-14 NOTE — Patient Instructions (Signed)
Kidney Stones  Kidney stones are solid, rock-like deposits that form inside of the kidneys. The kidneys are a pair of organs that make urine. A kidney stone may form in a kidney and move into other parts of the urinary tract, including the tubes that connect the kidneys to the bladder (ureters), the bladder, and the tube that carries urine out of the body (urethra). As the stone moves through these areas, it can cause intense pain and block the flow of urine. Kidney stones are created when high levels of certain minerals are found in the urine. The stones are usually passed out of the body through urination, but in some cases, medical treatment may be needed to remove them. What are the causes? Kidney stones may be caused by:  A condition in which certain glands produce too much parathyroid hormone (primary hyperparathyroidism), which causes too much calcium buildup in the blood.  A buildup of uric acid crystals in the bladder (hyperuricosuria). Uric acid is a chemical that the body produces when you eat certain foods. It usually exits the body in the urine.  Narrowing (stricture) of one or both of the ureters.  A kidney blockage that is present at birth (congenital obstruction).  Past surgery on the kidney or the ureters, such as gastric bypass surgery. What increases the risk? The following factors may make you more likely to develop this condition:  Having had a kidney stone in the past.  Having a family history of kidney stones.  Not drinking enough water.  Eating a diet that is high in protein, salt (sodium), or sugar.  Being overweight or obese. What are the signs or symptoms? Symptoms of a kidney stone may include:  Pain in the side of the abdomen, right below the ribs (flank pain). Pain usually spreads (radiates) to the groin.  Needing to urinate frequently or urgently.  Painful urination.  Blood in the urine (hematuria).  Nausea.  Vomiting.  Fever and chills. How  is this diagnosed? This condition may be diagnosed based on:  Your symptoms and medical history.  A physical exam.  Blood tests.  Urine tests. These may be done before and after the stone passes out of your body through urination.  Imaging tests, such as a CT scan, abdominal X-ray, or ultrasound.  A procedure to examine the inside of the bladder (cystoscopy). How is this treated? Treatment for kidney stones depends on the size, location, and makeup of the stones. Kidney stones will often pass out of the body through urination. You may need to:  Increase your fluid intake to help pass the stone. In some cases, you may be given fluids through an IV and may need to be monitored at the hospital.  Take medicine for pain.  Make changes in your diet to help prevent kidney stones from coming back. Sometimes, medical procedures are needed to remove a kidney stone. This may involve:  A procedure to break up kidney stones using: ? A focused beam of light (laser therapy). ? Shock waves (extracorporeal shock wave lithotripsy).  Surgery to remove kidney stones. This may be needed if you have severe pain or have stones that block your urinary tract. Follow these instructions at home: Medicines  Take over-the-counter and prescription medicines only as told by your health care provider.  Ask your health care provider if the medicine prescribed to you requires you to avoid driving or using heavy machinery. Eating and drinking  Drink enough fluid to keep your urine pale yellow.   You may be instructed to drink at least 8-10 glasses of water each day. This will help you pass the kidney stone.  If directed, change your diet. This may include: ? Limiting how much sodium you eat. ? Eating more fruits and vegetables. ? Limiting how much animal protein--such as red meat, poultry, fish, and eggs--you eat.  Follow instructions from your health care provider about eating or drinking  restrictions. General instructions  Collect urine samples as told by your health care provider. You may need to collect a urine sample: ? 24 hours after you pass the stone. ? 8-12 weeks after passing the kidney stone, and every 6-12 months after that.  Strain your urine every time you urinate, for as long as directed. Use the strainer that your health care provider recommends.  Do not throw out the kidney stone after passing it. Keep the stone so it can be tested by your health care provider. Testing the makeup of your kidney stone may help prevent you from getting kidney stones in the future.  Keep all follow-up visits as told by your health care provider. This is important. You may need follow-up X-rays or ultrasounds to make sure that your stone has passed. How is this prevented? To prevent another kidney stone:  Drink enough fluid to keep your urine pale yellow. This is the best way to prevent kidney stones.  Eat a healthy diet and follow recommendations from your health care provider about foods to avoid. You may be instructed to eat a low-protein diet. Recommendations vary depending on the type of kidney stone that you have.  Maintain a healthy weight.   Where to find more information  National Kidney Foundation (NKF): www.kidney.org  [employer] (UCF): www.urologyhealth.org Contact a health care provider if:  You have pain that gets worse or does not get better with medicine. Get help right away if:  You have a fever or chills.  You develop severe pain.  You develop new abdominal pain.  You faint.  You are unable to urinate. Summary  Kidney stones are solid, rock-like deposits that form inside of the kidneys.  Kidney stones can cause nausea, vomiting, blood in the urine, abdominal pain, and the urge to urinate frequently.  Treatment for kidney stones depends on the size, location, and makeup of the stones. Kidney stones will often pass out of the body  through urination.  Kidney stones can be prevented by drinking enough fluids, eating a healthy diet, and maintaining a healthy weight. This information is not intended to replace advice given to you by your health care provider. Make sure you discuss any questions you have with your health care provider. Document Revised: 10/23/2018 Document Reviewed: 10/23/2018 Elsevier Patient Education  2021 Elsevier Inc.  

## 2020-08-15 LAB — URINALYSIS, MICROSCOPIC ONLY
Bacteria, UA: NONE SEEN
Casts: NONE SEEN /lpf
WBC, UA: NONE SEEN /hpf (ref 0–5)

## 2020-08-16 LAB — URINE CULTURE

## 2020-08-17 ENCOUNTER — Ambulatory Visit: Payer: Self-pay | Admitting: Family Medicine

## 2020-08-18 NOTE — Telephone Encounter (Signed)
-----   Message from Virginia Crews, MD sent at 08/14/2020  3:16 PM EST ----- Regarding: FW:  Spike to patient about results. Please place referral to urology (Dr Erlene Quan). Thanks! ----- Message ----- From: Interface, Rad Results In Sent: 08/14/2020   2:43 PM EST To: Virginia Crews, MD

## 2020-09-01 ENCOUNTER — Encounter: Payer: Self-pay | Admitting: Urology

## 2020-09-01 ENCOUNTER — Other Ambulatory Visit: Payer: Self-pay

## 2020-09-01 ENCOUNTER — Ambulatory Visit (INDEPENDENT_AMBULATORY_CARE_PROVIDER_SITE_OTHER): Payer: Managed Care, Other (non HMO) | Admitting: Urology

## 2020-09-01 VITALS — BP 174/119 | HR 108 | Ht 68.0 in | Wt 221.0 lb

## 2020-09-01 DIAGNOSIS — R3129 Other microscopic hematuria: Secondary | ICD-10-CM | POA: Diagnosis not present

## 2020-09-01 DIAGNOSIS — R109 Unspecified abdominal pain: Secondary | ICD-10-CM | POA: Diagnosis not present

## 2020-09-01 DIAGNOSIS — N281 Cyst of kidney, acquired: Secondary | ICD-10-CM | POA: Diagnosis not present

## 2020-09-01 LAB — URINALYSIS, COMPLETE
Bilirubin, UA: NEGATIVE
Glucose, UA: NEGATIVE
Ketones, UA: NEGATIVE
Nitrite, UA: NEGATIVE
Protein,UA: NEGATIVE
Specific Gravity, UA: 1.025 (ref 1.005–1.030)
Urobilinogen, Ur: 0.2 mg/dL (ref 0.2–1.0)
pH, UA: 6 (ref 5.0–7.5)

## 2020-09-01 LAB — MICROSCOPIC EXAMINATION

## 2020-09-01 NOTE — Progress Notes (Signed)
09/01/2020 9:34 AM   Patricia Glenn 1986/04/30 [id number]  Referring provider: Virginia Crews, MD [address] [place],  [place] [address]  Chief Complaint  Patient presents with  . Flank Pain    HPI: 35 year old female who presents today for further evaluation of right flank pain.  Patricia Glenn reports an episode of acute onset severe right flank pain which was 10 out of 10 on 08/11/2020.  She is out to dinner with friends when her pain first started.  It ultimately became severe associated with vomiting.  She ended up going to the ER at which time she does not have microscopic blood in her urine.  Unfortunately, she was prescribed no pain medications and no additional imaging was obtained.  She was seen and evaluated by her primary care physician on 08/14/2020 with acute new what was described as thoracic back pain.  At that time, she underwent further evaluation with CT stone protocol as well as UA urine culture.  Urinalysis at the time did have microscopic blood, 3-10 red blood cells per high-power field with a negative culture.  Noncontrast CT scan showed punctate right renal stone and possible "right-sided hydronephrosis" without ureteral dilation.  No personal history of kidney stones.  She does have a family history of stones in her mother.  She denies any ongoing flank pain.  She has no microscopic or gross hematuria.  No dysuria.  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   PMH: Past Medical History:  Diagnosis Date  . Atypical mole 07/19/2018   DYSPLASTIC COMPOUND NEVUS WITH MODERATE ATYPIA  . 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  . Encounter for induction of labor 11/17/2017  . Encounter for supervision of other normal pregnancy, unspecified trimester 05/23/2017   35 y.o. G3P2002 at 54w1dby Patient's last menstrual period  was 03/13/2017. Estimated Date of Delivery: 12/18/17  c/w 127w1dltrasound Sex of baby and name:  " "  Factors complicating this pregnancy   LLL pneumonia 24 weeks  Inpatient  Repeat CXR 3 weeks post _0   Elevated BP at NOB- watch closely; baseline labs ordered and WNL-05/23/17  Hx of LGA , previous babies weighed 4309 grams and 4139 gra  . 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 Lung mass   . Mass of lower lobe of left lung 11/17/2017  . Pneumonia complicating pregnancy, second trimester 08/29/2017    Surgical History: Past Surgical History:  Procedure Laterality Date  . FLEXIBLE BRONCHOSCOPY N/A 11/29/2017   Procedure: FLEXIBLE BRONCHOSCOPY;  Surgeon: SiWilhelmina McardleMD;  Location: [facility];  Service: Pulmonary;  Laterality: N/A;  . VIDEO ASSISTED THORACOSCOPY (VATS)/WEDGE RESECTION Left 12/29/2017   Procedure: Left VIDEO ASSISTED THORACOSCOPY with Mini Thoracotomy, Left Lower Lobe Lobectomy, Node Dissection,  Insertion of  ONQ pain pump;  Surgeon: GeGrace IsaacMD;  [facility] Service: Thoracic;  Laterality: Left;  . Marland KitchenIDEO BRONCHOSCOPY N/A 12/29/2017   Procedure: VIDEO BRONCHOSCOPY;  Surgeon: GeGrace IsaacMD;  [facility];  Service: Thoracic;  Laterality: N/A;    Home Medications:  Allergies as of 09/01/2020   No Known Allergies     Medication List       Accurate as of September 01, 2020  9:34 AM. If you have any questions, ask your nurse or doctor.  levonorgestrel-ethinyl estradiol 0.15-0.03 MG tablet Commonly known as: SEASONALE Take 1 tablet by mouth daily.   MULTI-VITAMIN DAILY PO Take 1 tablet by mouth daily.       Allergies: No Known Allergies  Family History: Family History  Problem Relation Age of Onset  . Prostate cancer Father 52  . Kidney disease Father   . Diabetes Maternal Grandmother   . Parkinson's disease Maternal Grandmother   . Heart disease Paternal Grandmother 51       smoker  . Bladder  Cancer Paternal Grandfather 74  . Polycystic kidney disease Paternal Grandfather   . Polycystic kidney disease Other   . Breast cancer Neg Hx   . Ovarian cancer Neg Hx   . Colon cancer Neg Hx     Social History:  reports that she has never smoked. She has never used smokeless tobacco. She reports current alcohol use. She reports that she does not use drugs.   Physical Exam: BP (!) 174/119   Pulse (!) 108   Ht _0  (1.727 m)   Wt 221 lb (100.2 kg)   BMI 33.60 kg/m   Constitutional:  Alert and oriented, No acute distress. HEENT: [place] AT, moist mucus membranes.  Trachea midline, no masses. Cardiovascular: No clubbing, cyanosis, or edema. Respiratory: Normal respiratory effort, no increased work of breathing. Skin: No rashes, bruises or suspicious lesions. Neurologic: Grossly intact, no focal deficits, moving all 4 extremities. Psychiatric: Normal mood and affect.  Laboratory Data: Lab Results  Component Value Date   WBC 8.7 08/11/2020   HGB 12.9 08/11/2020   HCT 39.6 08/11/2020   MCV 81.1 08/11/2020   PLT 305 08/11/2020    Lab Results  Component Value Date   CREATININE 0.87 08/11/2020    Urinalysis Urinalysis today without microscopic blood  Pertinent Imaging: CT RENAL STONE STUDY  Narrative CLINICAL DATA:  Acute right flank pain.  EXAM: CT ABDOMEN AND PELVIS WITHOUT CONTRAST  TECHNIQUE: Multidetector CT imaging of the abdomen and pelvis was performed following the standard protocol without IV contrast.  COMPARISON:  None.  FINDINGS: Lower chest: No acute abnormality.  Hepatobiliary: No focal liver abnormality is seen. No gallstones, gallbladder wall thickening, or biliary dilatation.  Pancreas: Unremarkable. No pancreatic ductal dilatation or surrounding inflammatory changes.  Spleen: Normal in size without focal abnormality.  Adrenals/Urinary Tract: Adrenal glands appear normal. Exophytic right renal cyst is noted. Possible small right renal  calculus is noted. Minimal right hydronephrosis is noted without ureteral dilatation, which may be due to some degree of ureteropelvic junction stenosis or obstruct. No definite ureteral calculi are noted. Urinary bladder is unremarkable.  Stomach/Bowel: Stomach is within normal limits. Appendix appears normal. No evidence of bowel wall thickening, distention, or inflammatory changes.  Vascular/Lymphatic: No significant vascular findings are present. No enlarged abdominal or pelvic lymph nodes.  Reproductive: Uterus and bilateral adnexa are unremarkable.  Other: No abdominal wall hernia or abnormality. No abdominopelvic ascites.  Musculoskeletal: No acute or significant osseous findings.  IMPRESSION: 1. Possible small right renal calculus. Minimal right hydronephrosis is noted without ureteral dilatation, which may be due to some degree of ureteropelvic junction stenosis or obstruction. No definite ureteral calculi are noted. 2. No other abnormality seen in the abdomen or pelvis.   Electronically Signed By: Marijo Conception M.D. On: 08/14/2020 14:40  CT scan was personally reviewed today.  No significant stone burden.  In comparison to PET scan 6 months ago, the "hydronephrosis" is likely in fact a simple right renal cyst  adjacent to the renal pelvis.  Assessment & Plan:    1. Right flank pain Based on the acuity and severity of her pain associated with vomiting and microscopic hematuria, is highly likely that she did in fact have an acute stone episode  Given that her CT scan was 2 days following this isolated event, it is likely that she had already passed the stone  She does have a punctate stone in the right kidney which is quite small and requires no intervention  There are some mention of hydronephrosis as outlined above but in fact this likely represents parapelvic renal cyst a review of previous imaging.  No concern for ureteral obstruction.  No ongoing flank  pain.  We discussed general stone prevention techniques including drinking plenty water with goal of producing 2.5 L urine daily, increased citric acid intake, avoidance of high oxalate containing foods, and decreased salt intake.  Information about dietary recommendations given today. - Urinalysis, Complete  2. Renal cyst, right Review of previous PET scan indicate a benign appearing right renal cyst which is stable in size  She does have concern today about family history of polycystic kidney, she was reassured that this in fact is not consistent with polycystic kidney disease and that she does not need any intervention or follow-up for this at this time  3. Microscopic hematuria Resolved, urinalysis today is negative for ongoing blood    Hollice Espy, MD  [facility] [address], [place], [place] [address] [phone]  I spent 45 total minutes on the day of the encounter including pre-visit review of the medical record, face-to-face time with the patient, and post visit ordering of labs/imaging/tests.

## 2021-02-23 LAB — BASIC METABOLIC PANEL
BUN: 12 (ref 4–21)
CO2: 25 — AB (ref 13–22)
Chloride: 105 (ref 99–108)
Creatinine: 0.9 (ref <=1.1)
Glucose: 89
Potassium: 3.8 (ref 3.4–5.3)
Sodium: 138 (ref 137–147)

## 2021-02-23 LAB — CBC AND DIFFERENTIAL
HCT: 44 (ref 36–46)
Hemoglobin: 14.4 (ref 12.0–16.0)
Platelets: 280 (ref 150–399)
WBC: 7

## 2021-02-23 LAB — HEPATIC FUNCTION PANEL
ALT: 24 (ref 7–35)
AST: 24 (ref 13–35)
Alkaline Phosphatase: 55 (ref 25–125)
Bilirubin, Total: 0.6

## 2021-02-23 LAB — CBC: RBC: 5.14 — AB (ref 3.87–5.11)

## 2021-02-23 LAB — COMPREHENSIVE METABOLIC PANEL
Albumin: 4.3 (ref 3.5–5.0)
Calcium: 9.1 (ref 8.7–10.7)

## 2021-03-23 ENCOUNTER — Encounter: Payer: Managed Care, Other (non HMO) | Admitting: Family Medicine

## 2021-05-28 ENCOUNTER — Encounter: Payer: Self-pay | Admitting: Family Medicine

## 2021-05-28 ENCOUNTER — Ambulatory Visit (INDEPENDENT_AMBULATORY_CARE_PROVIDER_SITE_OTHER): Payer: Managed Care, Other (non HMO) | Admitting: Family Medicine

## 2021-05-28 ENCOUNTER — Other Ambulatory Visit: Payer: Self-pay

## 2021-05-28 VITALS — BP 125/80 | HR 103 | Temp 98.2°F | Resp 16 | Ht 68.0 in | Wt 220.7 lb

## 2021-05-28 DIAGNOSIS — Z23 Encounter for immunization: Secondary | ICD-10-CM | POA: Diagnosis not present

## 2021-05-28 DIAGNOSIS — C7B09 Secondary carcinoid tumors of other sites: Secondary | ICD-10-CM

## 2021-05-28 DIAGNOSIS — E669 Obesity, unspecified: Secondary | ICD-10-CM | POA: Diagnosis not present

## 2021-05-28 DIAGNOSIS — C7A00 Malignant carcinoid tumor of unspecified site: Secondary | ICD-10-CM

## 2021-05-28 DIAGNOSIS — R030 Elevated blood-pressure reading, without diagnosis of hypertension: Secondary | ICD-10-CM

## 2021-05-28 DIAGNOSIS — Z0000 Encounter for general adult medical examination without abnormal findings: Secondary | ICD-10-CM

## 2021-05-28 DIAGNOSIS — Z6834 Body mass index (BMI) 34.0-34.9, adult: Secondary | ICD-10-CM

## 2021-05-28 NOTE — Assessment & Plan Note (Signed)
BP elevated today in office Home BP 120/80 Continue to check home BP Recheck at next visit, return sooner if home BP >140/90

## 2021-05-28 NOTE — Assessment & Plan Note (Signed)
S/p resection in 12/2017 Followed by oncology PET scan 02/2020 no recurrence, no metastases

## 2021-05-28 NOTE — Progress Notes (Signed)
Complete physical exam   Patient: Patricia Glenn   DOB: 1985-08-23   35 y.o. Female  MRN: [id number] Visit Date: 05/28/2021  Today's healthcare provider: Lavon Paganini, MD   Chief Complaint  Patient presents with   Annual Exam   Subjective    Patricia Glenn is a 35 y.o. female who presents today for a complete physical exam.  She reports consuming a general diet. Home exercise routine includes running (half marathons and an upcoming full marathon in January). She generally feels well. She reports sleeping well. She does not have additional problems to discuss today.    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   Encounter for induction of labor 11/17/2017   Encounter for supervision of other normal pregnancy, unspecified trimester 05/23/2017   35 y.o. G3P2002 at 74w1dby Patient's last menstrual period was 03/13/2017. 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, none since pregnancies   Lung mass    Mass of lower lobe of left lung 11/17/2017   Pneumonia complicating pregnancy, second trimester 08/29/2017   Past Surgical History:  Procedure Laterality Date   FLEXIBLE BRONCHOSCOPY N/A 11/29/2017   Procedure: FLEXIBLE BRONCHOSCOPY;  Surgeon: SiWilhelmina McardleMD;  Location: [facility];  Service: Pulmonary;  Laterality: N/A;   VIDEO ASSISTED THORACOSCOPY (VATS)/WEDGE RESECTION Left 12/29/2017   Procedure: Left VIDEO ASSISTED THORACOSCOPY with Mini  Thoracotomy, Left Lower Lobe Lobectomy, Node Dissection,  Insertion of  ONQ pain pump;  Surgeon: GeGrace IsaacMD;  [facility];  Service: Thoracic;  Laterality: Left;   VIDEO BRONCHOSCOPY N/A 12/29/2017   Procedure: VIDEO BRONCHOSCOPY;  Surgeon: GeGrace IsaacMD;  [facility];  Service: Thoracic;  Laterality: N/A;   Social History   Socioeconomic History   Marital status: Married    Spouse name: Not on file   Number of children: Not on file   Years of education: Not on file   Highest education level: Not on file  Occupational History   Not on file  Tobacco Use   Smoking status: Never   Smokeless tobacco: Never  Vaping Use   Vaping Use: Never used  Substance and Sexual Activity   Alcohol use: Yes    Comment: occasional   Drug use: No   Sexual activity: Not on file  Other Topics Concern   Not on file  Social History Narrative    stay home mom-; lives at home; 8 miles from here; no smoking; no second smoking; alcohol ocassional; 2 daughters.    Social Determinants of Health   Financial Resource Strain: Not on file  Food Insecurity: Not on file  Transportation Needs: Not on file  Physical Activity: Not on file  Stress: Not on file  Social Connections: Not on file  Intimate Partner Violence: Not on file   Family Status  Relation Name Status   MGF  Alive  Father  (Not Specified)   MGM  (Not Specified)   PGM  Deceased   PGF  Deceased   Other  (Not Specified)   Neg Hx  (Not Specified)   Family History  Problem Relation Age of Onset   Prostate cancer Father 41   Kidney disease Father    Diabetes Maternal Grandmother    Parkinson's disease Maternal Grandmother    Heart disease Paternal Grandmother 96       smoker   Bladder Cancer Paternal Grandfather 87   Polycystic kidney disease Paternal Grandfather    Polycystic kidney disease Other    Breast cancer Neg Hx    Ovarian cancer Neg Hx    Colon cancer Neg Hx    No Known Allergies  Patient Care  Team: Georganne Siple, Dionne Bucy, MD as PCP - General (Family Medicine) Telford Nab, RN as Registered Nurse Leslie Andrea, MD as Referring Physician (Internal Medicine)   Medications: Outpatient Medications Prior to Visit  Medication Sig   Multiple Vitamin (MULTI-VITAMIN DAILY PO) Take 1 tablet by mouth daily.   [DISCONTINUED] levonorgestrel-ethinyl estradiol (SEASONALE) 0.15-0.03 MG tablet Take 1 tablet by mouth daily.   No facility-administered medications prior to visit.    Review of Systems  Constitutional: Negative.   HENT: Negative.    Eyes: Negative.   Respiratory:  Positive for cough.   Cardiovascular: Negative.  Negative for chest pain and leg swelling.  Gastrointestinal: Negative.  Negative for abdominal distention and abdominal pain.  Genitourinary: Negative.   Skin: Negative.   Neurological: Negative.   Hematological: Negative.   Psychiatric/Behavioral:  The patient is not nervous/anxious.      Objective    BP 125/80 Comment: home readings  Pulse (!) 103   Temp 98.2 F (36.8 C) (Oral)   Resp 16   Ht _0  (1.727 m)   Wt 220 lb 11.2 oz (100.1 kg)   BMI 33.56 kg/m    Physical Exam Vitals reviewed.  Constitutional:      General: She is not in acute distress.    Appearance: Normal appearance. She is not ill-appearing or toxic-appearing.  HENT:     Head: Normocephalic and atraumatic.     Right Ear: External ear normal.     Left Ear: External ear normal.     Nose: Nose normal.     Mouth/Throat:     Mouth: Mucous membranes are moist.     Pharynx: Oropharynx is clear. No oropharyngeal exudate or posterior oropharyngeal erythema.  Eyes:     General: No scleral icterus.    Extraocular Movements: Extraocular movements intact.     Conjunctiva/sclera: Conjunctivae normal.     Pupils: Pupils are equal, round, and reactive to light.  Cardiovascular:     Rate and Rhythm: Regular rhythm. Tachycardia present.     Pulses: Normal pulses.     Heart sounds: Normal  heart sounds. No murmur heard.   No friction rub. No gallop.  Pulmonary:     Effort: Pulmonary effort is normal. No respiratory distress.     Breath sounds: Normal breath sounds. No wheezing or rhonchi.  Chest:     Chest wall: No tenderness.  Abdominal:     General: Abdomen is flat. There is no distension.     Palpations: Abdomen is soft.     Tenderness: There is no abdominal tenderness.  Musculoskeletal:        General: Normal range of motion.     Cervical back: Normal range of motion and neck supple.  Right lower leg: No edema.     Left lower leg: No edema.  Skin:    General: Skin is warm and dry.     Capillary Refill: Capillary refill takes less than 2 seconds.     Findings: No lesion or rash.  Neurological:     General: No focal deficit present.     Mental Status: She is alert and oriented to person, place, and time. Mental status is at baseline.  Psychiatric:        Mood and Affect: Mood normal.    Last depression screening scores PHQ 2/9 Scores 05/28/2021 03/19/2020 03/20/2019  PHQ - 2 Score 0 0 0  PHQ- 9 Score - 0 1   Last fall risk screening Fall Risk  05/28/2021  Falls in the past year? 0  Number falls in past yr: 0  Injury with Fall? 0  Risk for fall due to : -  Follow up -   Last Audit-C alcohol use screening Alcohol Use Disorder Test (AUDIT) 05/28/2021  1. How often do you have a drink containing alcohol? 2  2. How many drinks containing alcohol do you have on a typical day when you are drinking? 0  3. How often do you have six or more drinks on one occasion? 0  AUDIT-C Score 2  Alcohol Brief Interventions/Follow-up -   A score of 3 or more in women, and 4 or more in men indicates increased risk for alcohol abuse, EXCEPT if all of the points are from question 1   No results found for any visits on 05/28/21.  Assessment & Plan    Routine Health Maintenance and Physical Exam  Exercise Activities and Dietary recommendations  Goals   None      Immunization History  Administered Date(s) Administered   Influenza Inj Mdck Quad With Preservative 06/21/2017   Influenza, Quadrivalent, Recombinant, Inj, Pf 05/01/2018   Influenza,inj,Quad PF,6+ Mos 03/20/2019, 03/19/2020, 05/28/2021   Moderna Sars-Covid-2 Vaccination 09/06/2019, 10/07/2019, 04/16/2020   PNEUMOCOCCAL CONJUGATE-20 05/28/2021   Tdap 09/02/2014, 10/03/2017    Health Maintenance  Topic Date Due   COVID-19 Vaccine (4 - Booster for Moderna series) 06/11/2020   PAP SMEAR-Modifier  05/25/2022   TETANUS/TDAP  10/04/2027   Pneumococcal Vaccine 98-61 Years old  Completed   INFLUENZA VACCINE  Completed   Hepatitis C Screening  Completed   HIV Screening  Completed   HPV VACCINES  Aged Out    Discussed health benefits of physical activity, and encouraged her to engage in regular exercise appropriate for her age and condition.  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 ([facility])    S/p resection in 12/2017 Followed by oncology PET scan 02/2020 no recurrence, no metastases        Other   Obesity    Discussed importance of healthy weight management Discussed diet and exercise      Relevant Orders   Lipid panel   TSH   Other Visit Diagnoses     Encounter for annual physical exam    -  Primary   Relevant Orders   Lipid panel   TSH   Need for  influenza vaccination       Relevant Orders   Flu Vaccine QUAD 15moIM (Fluarix, Fluzone & Alfiuria Quad PF) (Completed)   Need for pneumococcal vaccination       Relevant Orders   Pneumococcal conjugate vaccine 20-valent (Prevnar 20) (Completed)        Return in about 3 months (around 08/26/2021) for BP f/u.      ENelva Nay Medical Student 05/28/2021, 10:18 AM   Patient seen along with MS3 student ENelva Nay I personally evaluated this patient along with the student, and verified all aspects of the history, physical exam, and medical decision making as documented by the student. I agree with the student's documentation and have made all necessary edits.  Meha Vidrine, ADionne Bucy MD, MPH [facility]

## 2021-05-28 NOTE — Assessment & Plan Note (Signed)
Discussed importance of healthy weight management Discussed diet and exercise  

## 2021-07-22 ENCOUNTER — Other Ambulatory Visit: Payer: Self-pay

## 2021-07-22 ENCOUNTER — Encounter: Payer: Self-pay | Admitting: Dermatology

## 2021-07-22 ENCOUNTER — Ambulatory Visit (INDEPENDENT_AMBULATORY_CARE_PROVIDER_SITE_OTHER): Payer: Managed Care, Other (non HMO) | Admitting: Dermatology

## 2021-07-22 DIAGNOSIS — D229 Melanocytic nevi, unspecified: Secondary | ICD-10-CM | POA: Diagnosis not present

## 2021-07-22 DIAGNOSIS — D1800 Hemangioma unspecified site: Secondary | ICD-10-CM | POA: Diagnosis not present

## 2021-07-22 DIAGNOSIS — L814 Other melanin hyperpigmentation: Secondary | ICD-10-CM

## 2021-07-22 DIAGNOSIS — L578 Other skin changes due to chronic exposure to nonionizing radiation: Secondary | ICD-10-CM | POA: Diagnosis not present

## 2021-07-22 DIAGNOSIS — Z1283 Encounter for screening for malignant neoplasm of skin: Secondary | ICD-10-CM

## 2021-07-22 DIAGNOSIS — L821 Other seborrheic keratosis: Secondary | ICD-10-CM

## 2021-07-22 DIAGNOSIS — L858 Other specified epidermal thickening: Secondary | ICD-10-CM

## 2021-07-22 DIAGNOSIS — Z86018 Personal history of other benign neoplasm: Secondary | ICD-10-CM

## 2021-07-22 NOTE — Patient Instructions (Signed)
Recommend taking Heliocare sun protection supplement daily in sunny weather for additional sun protection. For maximum protection on the sunniest days, you can take up to 2 capsules of regular Heliocare OR take 1 capsule of Heliocare Ultra. For prolonged exposure (such as a full day in the sun), you can repeat your dose of the supplement 4 hours after your first dose. Heliocare can be purchased at [employer], at some [facility] or at [email or web address].    Recommend daily broad spectrum sunscreen SPF 30+ to sun-exposed areas, reapply every 2 hours as needed. Call for new or changing lesions.  Staying in the shade or wearing long sleeves, sun glasses (UVA+UVB protection) and wide brim hats (4-inch brim around the entire circumference of the hat) are also recommended for sun protection.    Melanoma ABCDEs  Melanoma is the most dangerous type of skin cancer, and is the leading cause of death from skin disease.  You are more likely to develop melanoma if you: Have light-colored skin, light-colored eyes, or red or blond hair Spend a lot of time in the sun Tan regularly, either outdoors or in a tanning bed Have had blistering sunburns, especially during childhood Have a close family member who has had a melanoma Have atypical moles or large birthmarks  Early detection of melanoma is key since treatment is typically straightforward and cure rates are extremely high if we catch it early.   The first sign of melanoma is often a change in a mole or a new dark spot.  The ABCDE system is a way of remembering the signs of melanoma.  A for asymmetry:  The two halves do not match. B for border:  The edges of the growth are irregular. C for color:  A mixture of colors are present instead of an even brown color. D for diameter:  Melanomas are usually (but not always) greater than 32mm - the size of a pencil eraser. E for evolution:  The spot keeps changing in size, shape, and color.  Please check  your skin once per month between visits. You can use a small mirror in front and a large mirror behind you to keep an eye on the back side or your body.   If you see any new or changing lesions before your next follow-up, please call to schedule a visit.  Please continue daily skin protection including broad spectrum sunscreen SPF 30+ to sun-exposed areas, reapplying every 2 hours as needed when you're outdoors.    If You Need Anything After Your Visit  If you have any questions or concerns for your doctor, please call our main line at [phone] and press option 4 to reach your doctor's medical assistant. If no one answers, please leave a voicemail as directed and we will return your call as soon as possible. Messages left after 4 pm will be answered the following business day.   You may also send [place] a message via [place]. We typically respond to MyChart messages within 1-2 business days.  For prescription refills, please ask your pharmacy to contact our office. Our fax number is [phone].  If you have an urgent issue when the clinic is closed that cannot wait until the next business day, you can page your doctor at the number below.    Please note that while we do our best to be available for urgent issues outside of office hours, we are not available 24/7.   If you have an urgent issue and are unable to  reach [place], you may choose to seek medical care at your doctor's office, retail clinic, urgent care center, or emergency room.  If you have a medical emergency, please immediately call 911 or go to the emergency department.  Pager Numbers  - Dr. Nehemiah Massed: [phone]  - Dr. Laurence Ferrari: [phone]  - Dr. Nicole Kindred: [phone]  In the event of inclement weather, please call our main line at [phone] for an update on the status of any delays or closures.  Dermatology Medication Tips: Please keep the boxes that topical medications come in in order to help keep track of the  instructions about where and how to use these. Pharmacies typically print the medication instructions only on the boxes and not directly on the medication tubes.   If your medication is too expensive, please contact our office at [phone] option 4 or send [place] a message through [place].   We are unable to tell what your co-pay for medications will be in advance as this is different depending on your insurance coverage. However, we may be able to find a substitute medication at lower cost or fill out paperwork to get insurance to cover a needed medication.   If a prior authorization is required to get your medication covered by your insurance company, please allow [place] 1-2 business days to complete this process.  Drug prices often vary depending on where the prescription is filled and some pharmacies may offer cheaper prices.  The website www.goodrx.com contains coupons for medications through different pharmacies. The prices here do not account for what the cost may be with help from insurance (it may be cheaper with your insurance), but the website can give you the price if you did not use any insurance.  - You can print the associated coupon and take it with your prescription to the pharmacy.  - You may also stop by our office during regular business hours and pick up a GoodRx coupon card.  - If you need your prescription sent electronically to a different pharmacy, notify our office through [facility] or by phone at [phone] option 4.     Si Usted Necesita Algo Despus de Su Visita  Tambin puede enviarnos un mensaje a travs de Pharmacist, community. Por lo general respondemos a los mensajes de MyChart en el transcurso de 1 a 2 das hbiles.  Para renovar recetas, por favor pida a su farmacia que se ponga en contacto con nuestra oficina. Harland Dingwall de fax es [place] [phone].  Si tiene un asunto urgente cuando la clnica est cerrada y que no puede esperar hasta el siguiente da hbil,  puede llamar/localizar a su doctor(a) al nmero que aparece a continuacin.   Por favor, tenga en cuenta que aunque hacemos todo lo posible para estar disponibles para asuntos urgentes fuera del horario de [place], no estamos disponibles las 24 horas del da, los 7 das de la [place].   Si tiene un problema urgente y no puede comunicarse con nosotros, puede optar por buscar atencin mdica  en el consultorio de su doctor(a), en una clnica privada, en un centro de atencin urgente o en una sala de emergencias.  Si tiene Engineering geologist, por favor llame inmediatamente al 911 o vaya a la sala de emergencias.  Nmeros de bper  - Dr. Nehemiah Massed: [phone]  - Dra. Moye: [phone]  - Dra. Nicole Kindred: [phone]  En caso de inclemencias del [place], por favor llame a Johnsie Kindred principal al [phone] para una actualizacin sobre el [place] de cualquier  retraso o cierre.  Consejos para la medicacin en dermatologa: Por favor, guarde las cajas en las que vienen los medicamentos de uso tpico para ayudarle a seguir las instrucciones sobre dnde y cmo usarlos. Las farmacias generalmente imprimen las instrucciones del medicamento slo en las cajas y no directamente en los tubos del [place].   Si su medicamento es muy caro, por favor, pngase en contacto con Zigmund Daniel llamando al [phone] y presione la opcin 4 o envenos un mensaje a travs de Pharmacist, community.   No podemos decirle cul ser su copago por los medicamentos por adelantado ya que esto es diferente dependiendo de la cobertura de su seguro. Sin embargo, es posible que podamos encontrar un medicamento sustituto a Electrical engineer un formulario para que el seguro cubra el medicamento que se considera necesario.   Si se requiere una autorizacin previa para que su compaa de seguros [place] su medicamento, por favor permtanos de 1 a 2 das hbiles para completar este proceso.  Los precios de los medicamentos varan con  frecuencia dependiendo del Environmental consultant de dnde se surte la receta y alguna farmacias pueden ofrecer precios ms baratos.  El sitio web www.goodrx.com tiene cupones para medicamentos de Airline pilot. Los precios aqu no tienen en cuenta lo que podra costar con la ayuda del seguro (puede ser ms barato con su seguro), pero el sitio web puede darle el precio si no utiliz Research scientist (physical sciences).  - Puede imprimir el cupn correspondiente y llevarlo con su receta a la farmacia.  - Tambin puede pasar por nuestra oficina durante el horario de atencin regular y Charity fundraiser una tarjeta de cupones de GoodRx.  - Si necesita que su receta se enve electrnicamente a una farmacia diferente, informe a nuestra oficina a travs de MyChart de [place] o por telfono llamando al [phone] y presione la opcin 4.

## 2021-07-22 NOTE — Progress Notes (Signed)
° °  Follow-Up Visit   Subjective  Patricia Glenn is a 36 y.o. female who presents for the following: Annual Exam (Patient here for full body skin exam and skin cancer screening. Patient with hx of dysplastic nevus. She is not aware of any new or changing spots today. ).  The following portions of the chart were reviewed this encounter and updated as appropriate:   Tobacco   Allergies   Meds   Problems   Med Hx   Surg Hx   Fam Hx       Review of Systems:  No other skin or systemic complaints except as noted in HPI or Assessment and Plan.  Objective  Well appearing patient in no apparent distress; mood and affect are within normal limits.  A full examination was performed including scalp, head, eyes, ears, nose, lips, neck, chest, axillae, abdomen, back, buttocks, bilateral upper extremities, bilateral lower extremities, hands, feet, fingers, toes, fingernails, and toenails. All findings within normal limits unless otherwise noted below.    Assessment & Plan   History of Dysplastic Nevi - No evidence of recurrence today - Recommend regular full body skin exams - Recommend daily broad spectrum sunscreen SPF 30+ to sun-exposed areas, reapply every 2 hours as needed.  - Call if any new or changing lesions are noted between office visits  Lentigines - Scattered tan macules - Due to sun exposure - Benign-appearing, observe - Recommend daily broad spectrum sunscreen SPF 30+ to sun-exposed areas, reapply every 2 hours as needed. - Call for any changes  Seborrheic Keratoses - Stuck-on, waxy, tan-brown papules and/or plaques  - Benign-appearing - Discussed benign etiology and prognosis. - Observe - Call for any changes  Melanocytic Nevi - Tan-brown and/or pink-flesh-colored symmetric macules and papules - Benign appearing on exam today - Observation - Call clinic for new or changing moles - Recommend daily use of broad spectrum spf 30+ sunscreen to sun-exposed areas.    Hemangiomas - Red papules - Discussed benign nature - Observe - Call for any changes  Actinic Damage - Chronic condition, secondary to cumulative UV/sun exposure - diffuse scaly erythematous macules with underlying dyspigmentation - Recommend daily broad spectrum sunscreen SPF 30+ to sun-exposed areas, reapply every 2 hours as needed.  - Staying in the shade or wearing long sleeves, sun glasses (UVA+UVB protection) and wide brim hats (4-inch brim around the entire circumference of the hat) are also recommended for sun protection.  - Call for new or changing lesions.  Skin cancer screening performed today.  Keratosis Pilaris - Tiny follicular keratotic papules - Benign. Genetic in nature. No cure. - Observe. - If desired, patient can use an emollient (moisturizer) containing ammonium lactate, urea or salicylic acid once a day to smooth the area  Return in about 1 year (around 07/22/2022) for TBSE.  Graciella Belton, RMA, am acting as scribe for Forest Gleason, MD .  Documentation: I have reviewed the above documentation for accuracy and completeness, and I agree with the above.  Forest Gleason, MD

## 2021-07-22 NOTE — Progress Notes (Signed)
error 

## 2021-07-29 ENCOUNTER — Encounter: Payer: Self-pay | Admitting: Dermatology

## 2021-08-26 ENCOUNTER — Ambulatory Visit: Payer: Managed Care, Other (non HMO) | Admitting: Family Medicine

## 2021-09-20 NOTE — Progress Notes (Signed)
?  ? ?I,Sulibeya S Dimas,acting as a scribe for Lavon Paganini, MD.,have documented all relevant documentation on the behalf of Lavon Paganini, MD,as directed by  Lavon Paganini, MD while in the presence of Lavon Paganini, MD. ? ? ?Established patient visit ? ? ?Patient: Patricia Glenn   DOB: 1986-01-10   36 y.o. Female  MRN: [id number] ?Visit Date: 09/21/2021 ? ?Today's healthcare provider: Lavon Paganini, MD  ? ?Chief Complaint  ?Patient presents with  ? Follow-up  ? ?Subjective  ?  ?HPI  ?Follow up for Elevated Blood Pressure ? ?The patient was last seen for this 3 months ago. ?Changes made at last visit include none.No medication Home readings 120/80 ?Patient reports reading at home have been high -often elevated like in office today and even as high as 200s over 130s at times.  Her husband has used the same BP cuff and his blood pressure is normal on it, so they feel like it is accurate.  She does not have it with her today however.  Patient reports exercising daily. Denies any chest pain, shortness of breath or swelling around feet. ? ? ?She does report flushing, sweating, palpitations, elevated heart rate.  She was unable to finish her marathon recently due to the symptoms.  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  Her symptoms continue to worsen, however. ?-----------------------------------------------------------------------------------------  ? ?Medications: ?Outpatient Medications Prior to Visit  ?Medication Sig  ? Multiple Vitamin (MULTI-VITAMIN DAILY PO) Take 1 tablet by mouth daily.  ? ?No facility-administered medications prior to visit.  ? ? ?Review of Systems  ?Constitutional:  Negative for activity change, appetite change and fatigue.  ?Eyes:  Negative for visual disturbance.  ?Respiratory:  Negative for chest tightness and shortness of breath.   ?Cardiovascular:  Negative for chest pain and leg  swelling.  ?Neurological:  Positive for headaches.  ? ? ?  Objective  ?  ?BP (!) 158/117 (BP Location: Left Arm, Patient Position: Sitting, Cuff Size: Large)   Pulse (!) 105   Temp 97.7 ?F (36.5 ?C) (Temporal)   Resp 16   Ht '5\' 7"'$  (1.702 m)   Wt 222 lb (100.7 kg)   BMI 34.77 kg/m?  ? ? ?Physical Exam ?Vitals reviewed.  ?Constitutional:   ?   General: She is not in acute distress. ?   Appearance: Normal appearance. She is well-developed. She is not diaphoretic.  ?HENT:  ?   Head: Normocephalic and atraumatic.  ?Eyes:  ?   General: No scleral icterus. ?   Conjunctiva/sclera: Conjunctivae normal.  ?Neck:  ?   Thyroid: No thyromegaly.  ?Cardiovascular:  ?   Rate and Rhythm: Regular rhythm. Tachycardia present.  ?   Heart sounds: Normal heart sounds. No murmur heard. ?Pulmonary:  ?   Effort: Pulmonary effort is normal. No respiratory distress.  ?   Breath sounds: Normal breath sounds. No wheezing, rhonchi or rales.  ?Musculoskeletal:  ?   Cervical back: Neck supple.  ?   Right lower leg: No edema.  ?   Left lower leg: No edema.  ?Lymphadenopathy:  ?   Cervical: No cervical adenopathy.  ?Skin: ?   General: Skin is warm and dry.  ?   Findings: No rash.  ?   Comments: +Flushing  ?Neurological:  ?   Mental Status: She is alert and oriented to person, place, and time. Mental status is at baseline.  ?Psychiatric:     ?  Mood and Affect: Mood normal.     ?   Behavior: Behavior normal.  ?  ? ? ?No results found for any visits on 09/21/21. ? Assessment & Plan  ?  ? ?Problem List Items Addressed This Visit   ? ?  ? Cardiovascular and Mediastinum  ? Hypertension - Primary  ?  Uncontrolled ?New diagnosis given ongoing elevated blood pressures initially thought to be whitecoat hypertension ?This could be related to her carcinoid syndrome, but we do need to treat her blood pressure and get it down ?She currently has no symptoms, but we discussed red flags that would warrant emergent evaluation ?Will start HCTZ 25 mg  daily ?Recheck metabolic panel ?Follow-up in 1 month and recheck BMP at that time ?  ?  ? Relevant Medications  ? hydrochlorothiazide (HYDRODIURIL) 25 MG tablet  ? Other Relevant Orders  ? Comprehensive metabolic panel  ?  ? Immune and Lymphatic  ? 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  Status post resection 12/2017 ?Followed by oncology ?Recent CT scan with no recurrence and no metastases ?She is having worsening carcinoid syndrome symptoms, however, with tachycardia, flushing, elevated blood pressure as above ?We will recheck urine and serum labs ?Encouraged her to also follow-up with her oncologist ?Could consider follow-up with endocrinology as well ?  ?  ? Relevant Orders  ? CBC  ? Chromogranin A  ? 5 HIAA, quantitative, Urine, 24 hour  ?  ? Other  ? Obesity  ?  Discussed importance of healthy weight management ?Discussed diet and exercise ? ?  ?  ? 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  Patient with symptoms of carcinoid syndrome with history of carcinoid tumor status post resection ?Followed by oncology, but worsening symptoms ?Treat blood pressure as above ?Evaluation for palpitations as above ?Recheck labs ?Consider endocrinology follow-up possibly versus oncology follow-up ?  ?  ? Relevant Orders  ? CBC  ? Chromogranin A  ? 5 HIAA, quantitative, Urine, 24 hour  ? Palpitations  ?  New problem ?Likely related to her carcinoid syndrome with tachycardia, but do want to rule out any possible underlying arrhythmia ?EKG normal sinus rhythm today ?Check TSH, CMP, CBC ?Zio patch x14 days ?Further management pending results ?  ?  ? Relevant Orders  ? LONG TERM MONITOR (3-14 DAYS)  ? TSH  ? CBC  ? Comprehensive metabolic panel  ? EKG 12-Lead (Completed)  ?  ? ?Return in about 4 weeks (around 10/19/2021) for BP f/u.  ?   ? ?I, Lavon Paganini, MD, have reviewed all documentation for this visit. The documentation on 09/21/21 for the exam, diagnosis, procedures, and orders are all accurate and  complete. ? ? ?Virginia Crews, MD, MPH ?[facility]   ?

## 2021-09-21 ENCOUNTER — Inpatient Hospital Stay: Payer: Self-pay

## 2021-09-21 ENCOUNTER — Ambulatory Visit (INDEPENDENT_AMBULATORY_CARE_PROVIDER_SITE_OTHER): Payer: Managed Care, Other (non HMO) | Admitting: Family Medicine

## 2021-09-21 ENCOUNTER — Encounter: Payer: Self-pay | Admitting: Family Medicine

## 2021-09-21 VITALS — BP 158/117 | HR 105 | Temp 97.7°F | Resp 16 | Ht 67.0 in | Wt 222.0 lb

## 2021-09-21 DIAGNOSIS — R000 Tachycardia, unspecified: Secondary | ICD-10-CM | POA: Insufficient documentation

## 2021-09-21 DIAGNOSIS — E3400 Carcinoid syndrome, unspecified: Secondary | ICD-10-CM | POA: Insufficient documentation

## 2021-09-21 DIAGNOSIS — Z6834 Body mass index (BMI) 34.0-34.9, adult: Secondary | ICD-10-CM

## 2021-09-21 DIAGNOSIS — C7A00 Malignant carcinoid tumor of unspecified site: Secondary | ICD-10-CM | POA: Diagnosis not present

## 2021-09-21 DIAGNOSIS — E669 Obesity, unspecified: Secondary | ICD-10-CM

## 2021-09-21 DIAGNOSIS — I10 Essential (primary) hypertension: Secondary | ICD-10-CM | POA: Diagnosis not present

## 2021-09-21 DIAGNOSIS — C7B09 Secondary carcinoid tumors of other sites: Secondary | ICD-10-CM

## 2021-09-21 DIAGNOSIS — R002 Palpitations: Secondary | ICD-10-CM

## 2021-09-21 MED ORDER — HYDROCHLOROTHIAZIDE 25 MG PO TABS: 25.0000 mg | ORAL_TABLET | Freq: Every day | ORAL | 3 refills | Status: DC

## 2021-09-21 NOTE — Assessment & Plan Note (Signed)
Discussed importance of healthy weight management Discussed diet and exercise  

## 2021-09-21 NOTE — Assessment & Plan Note (Signed)
Uncontrolled ?New diagnosis given ongoing elevated blood pressures initially thought to be whitecoat hypertension ?This could be related to her carcinoid syndrome, but we do need to treat her blood pressure and get it down ?She currently has no symptoms, but we discussed red flags that would warrant emergent evaluation ?Will start HCTZ 25 mg daily ?Recheck metabolic panel ?Follow-up in 1 month and recheck BMP at that time ?

## 2021-09-21 NOTE — Assessment & Plan Note (Signed)
Status post resection 12/2017 ?Followed by oncology ?Recent CT scan with no recurrence and no metastases ?She is having worsening carcinoid syndrome symptoms, however, with tachycardia, flushing, elevated blood pressure as above ?We will recheck urine and serum labs ?Encouraged her to also follow-up with her oncologist ?Could consider follow-up with endocrinology as well ?

## 2021-09-21 NOTE — Assessment & Plan Note (Signed)
Patient with symptoms of carcinoid syndrome with history of carcinoid tumor status post resection ?Followed by oncology, but worsening symptoms ?Treat blood pressure as above ?Evaluation for palpitations as above ?Recheck labs ?Consider endocrinology follow-up possibly versus oncology follow-up ?

## 2021-09-21 NOTE — Assessment & Plan Note (Signed)
New problem ?Likely related to her carcinoid syndrome with tachycardia, but do want to rule out any possible underlying arrhythmia ?EKG normal sinus rhythm today ?Check TSH, CMP, CBC ?Zio patch x14 days ?Further management pending results ?

## 2021-09-23 LAB — CBC
Hematocrit: 44.1 % (ref 34.0–46.6)
Hemoglobin: 14.7 g/dL (ref 11.1–15.9)
MCH: 28.1 pg (ref 26.6–33.0)
MCHC: 33.3 g/dL (ref 31.5–35.7)
MCV: 84 fL (ref 79–97)
Platelets: 272 10*3/uL (ref 150–450)
RBC: 5.24 x10E6/uL (ref 3.77–5.28)
RDW: 13.3 % (ref 11.7–15.4)
WBC: 7.4 10*3/uL (ref 3.4–10.8)

## 2021-09-23 LAB — COMPREHENSIVE METABOLIC PANEL
ALT: 23 IU/L (ref 0–32)
AST: 21 IU/L (ref 0–40)
Albumin/Globulin Ratio: 1.7 (ref 1.2–2.2)
Albumin: 4.5 g/dL (ref 3.8–4.8)
Alkaline Phosphatase: 73 IU/L (ref 44–121)
BUN/Creatinine Ratio: 11 (ref 9–23)
BUN: 11 mg/dL (ref 6–20)
Bilirubin Total: 0.4 mg/dL (ref 0.0–1.2)
CO2: 20 mmol/L (ref 20–29)
Calcium: 9.4 mg/dL (ref 8.7–10.2)
Chloride: 103 mmol/L (ref 96–106)
Creatinine, Ser: 0.98 mg/dL (ref 0.57–1.00)
Globulin, Total: 2.7 g/dL (ref 1.5–4.5)
Glucose: 96 mg/dL (ref 70–99)
Potassium: 4.2 mmol/L (ref 3.5–5.2)
Sodium: 138 mmol/L (ref 134–144)
Total Protein: 7.2 g/dL (ref 6.0–8.5)
eGFR: 77 mL/min/{1.73_m2} (ref >59)

## 2021-09-23 LAB — CHROMOGRANIN A: Chromogranin A (ng/mL): 35.4 ng/mL (ref 0.0–101.8)

## 2021-09-23 LAB — TSH: TSH: 2.41 u[IU]/mL (ref 0.450–4.500)

## 2021-09-25 ENCOUNTER — Encounter: Payer: Self-pay | Admitting: Family Medicine

## 2021-09-25 LAB — 5 HIAA, QUANTITATIVE, URINE, 24 HOUR
5-HIAA, Ur: 3.7 mg/L
5-HIAA,Quant.,24 Hr Urine: 5.2 mg/24 hr (ref 0.0–14.9)

## 2021-10-18 ENCOUNTER — Encounter: Payer: Self-pay | Admitting: Family Medicine

## 2021-10-18 DIAGNOSIS — R002 Palpitations: Secondary | ICD-10-CM

## 2021-10-27 NOTE — Progress Notes (Signed)
?  ? ?I,Sulibeya S Dimas,acting as a scribe for Lavon Paganini, MD.,have documented all relevant documentation on the behalf of Lavon Paganini, MD,as directed by  Lavon Paganini, MD while in the presence of Lavon Paganini, MD. ? ? ?Established patient visit ? ? ?Patient: Patricia Glenn   DOB: 1985-06-22   36 y.o. Female  MRN: [id number] ?Visit Date: 10/28/2021 ? ?Today's healthcare provider: Lavon Paganini, MD  ? ?Chief Complaint  ?Patient presents with  ? Hypertension  ? ?Subjective  ?  ?HPI  ?Hypertension, follow-up ? ?BP Readings from Last 3 Encounters:  ?10/28/21 138/80  ?09/21/21 (!) 158/117  ?05/28/21 125/80  ? Wt Readings from Last 3 Encounters:  ?10/28/21 221 lb 8 oz (100.5 kg)  ?09/21/21 222 lb (100.7 kg)  ?05/28/21 220 lb 11.2 oz (100.1 kg)  ?  ? ?She was last seen for hypertension 1 months ago.  ?BP at that visit was 158/117. Management since that visit includes start HCTZ $RemoveBe'25mg'YzPArHJQY$  daily. ? ?She reports excellent compliance with treatment. ?She is not having side effects.  ?She is following a Low Sodium diet. ?She is exercising. ?She does not smoke. ? ?Use of agents associated with hypertension: none.  ? ?Outside blood pressures are elevated - 160s-170s/90s ? ?Symptoms: ?No chest pain No chest pressure  ?Yes palpitations No syncope  ?No dyspnea No orthopnea  ?No paroxysmal nocturnal dyspnea No lower extremity edema  ? ?Pertinent labs ?Lab Results  ?Component Value Date  ? CHOL 160 04/15/2020  ? HDL 31 (L) 04/15/2020  ? [place] 93 04/15/2020  ? TRIG 208 (H) 04/15/2020  ? CHOLHDL 5.2 (H) 04/15/2020  ? Lab Results  ?Component Value Date  ? NA 138 09/21/2021  ? K 4.2 09/21/2021  ? CREATININE 0.98 09/21/2021  ? EGFR 77 09/21/2021  ? GLUCOSE 96 09/21/2021  ? TSH 2.410 09/21/2021  ?  ? ?The ASCVD Risk score (Arnett DK, et al., 2019) failed to calculate for the following reasons: ?  The 2019 ASCVD risk score is only valid for ages 72 to  6 ? ?--------------------------------------------------------------------------------------------------- ? ? ?Medications: ?Outpatient Medications Prior to Visit  ?Medication Sig  ? hydrochlorothiazide (HYDRODIURIL) 25 MG tablet Take 1 tablet (25 mg total) by mouth daily.  ? levonorgestrel (LILETTA, 52 MG,) 20.1 MCG/DAY IUD by Intrauterine route.  ? Multiple Vitamin (MULTI-VITAMIN DAILY PO) Take 1 tablet by mouth daily.  ? ?No facility-administered medications prior to visit.  ? ? ?Review of Systems  ?Constitutional:  Negative for appetite change and fatigue.  ?Eyes:  Negative for visual disturbance.  ?Respiratory:  Negative for chest tightness and shortness of breath.   ?Cardiovascular:  Negative for chest pain, palpitations and leg swelling.  ? ?  ?  Objective  ?  ?BP 138/80 (BP Location: Left Arm, Cuff Size: Large)   Pulse 96   Temp 98.5 ?F (36.9 ?C) (Oral)   Resp 16   Ht $R'5\' 8"'za$  (1.727 m)   Wt 221 lb 8 oz (100.5 kg)   BMI 33.68 kg/m?  ?BP Readings from Last 3 Encounters:  ?10/28/21 138/80  ?09/21/21 (!) 158/117  ?05/28/21 125/80  ? ?Wt Readings from Last 3 Encounters:  ?10/28/21 221 lb 8 oz (100.5 kg)  ?09/21/21 222 lb (100.7 kg)  ?05/28/21 220 lb 11.2 oz (100.1 kg)  ? ?  ? ?Physical Exam ?Vitals reviewed.  ?Constitutional:   ?   General: She is not in acute distress. ?   Appearance: Normal appearance. She is well-developed. She is not diaphoretic.  ?HENT:  ?  Head: Normocephalic and atraumatic.  ?Eyes:  ?   General: No scleral icterus. ?   Conjunctiva/sclera: Conjunctivae normal.  ?Neck:  ?   Thyroid: No thyromegaly.  ?Cardiovascular:  ?   Rate and Rhythm: Normal rate and regular rhythm.  ?   Pulses: Normal pulses.  ?   Heart sounds: Normal heart sounds. No murmur heard. ?Pulmonary:  ?   Effort: Pulmonary effort is normal. No respiratory distress.  ?   Breath sounds: Normal breath sounds. No wheezing, rhonchi or rales.  ?Musculoskeletal:  ?   Cervical back: Neck supple.  ?   Right lower leg: No edema.  ?    Left lower leg: No edema.  ?Lymphadenopathy:  ?   Cervical: No cervical adenopathy.  ?Skin: ?   General: Skin is warm and dry.  ?Neurological:  ?   Mental Status: She is alert and oriented to person, place, and time. Mental status is at baseline.  ?Psychiatric:     ?   Mood and Affect: Mood normal.     ?   Behavior: Behavior normal.  ?  ? ? ?No results found for any visits on 10/28/21. ? Assessment & Plan  ?  ? ?Problem List Items Addressed This Visit   ? ?  ? Cardiovascular and Mediastinum  ? Hypertension - Primary  ?  Chronic and improving, but not quite to goal ?Continue current medications and add Metop as below ?Recheck metabolic panel ?F/u in 3 months  ?  ?  ? Relevant Medications  ? metoprolol succinate (TOPROL-XL) 25 MG 24 hr tablet  ? Other Relevant Orders  ? Basic metabolic panel  ?  ? Other  ? Sinus tachycardia  ?  Reviewed Zio patch results ?No arrhythmia  ?Sinus tachycardia intermittently ?Start Metop 44m daily ?Upcoming cardiology appt ?Oncology is not sure if this is related to carcinoid tumor ?Consider checking for pheochromocytoma ? ?  ?  ?  ? ?Return in about 3 months (around 01/28/2022) for chronic disease f/u.  ?   ? ?I, ALavon Paganini MD, have reviewed all documentation for this visit. The documentation on 10/28/21 for the exam, diagnosis, procedures, and orders are all accurate and complete. ? ? ?BVirginia Crews MD, MPH ?[place]   ?

## 2021-10-28 ENCOUNTER — Encounter: Payer: Self-pay | Admitting: Family Medicine

## 2021-10-28 ENCOUNTER — Ambulatory Visit (INDEPENDENT_AMBULATORY_CARE_PROVIDER_SITE_OTHER): Payer: Managed Care, Other (non HMO) | Admitting: Family Medicine

## 2021-10-28 VITALS — BP 138/80 | HR 96 | Temp 98.5°F | Resp 16 | Ht 68.0 in | Wt 221.5 lb

## 2021-10-28 DIAGNOSIS — I10 Essential (primary) hypertension: Secondary | ICD-10-CM

## 2021-10-28 DIAGNOSIS — R000 Tachycardia, unspecified: Secondary | ICD-10-CM | POA: Diagnosis not present

## 2021-10-28 MED ORDER — METOPROLOL SUCCINATE ER 25 MG PO TB24: 25.0000 mg | ORAL_TABLET | Freq: Every day | ORAL | 3 refills | Status: DC

## 2021-10-28 NOTE — Assessment & Plan Note (Signed)
Chronic and improving, but not quite to goal ?Continue current medications and add Metop as below ?Recheck metabolic panel ?F/u in 3 months  ?

## 2021-10-28 NOTE — Assessment & Plan Note (Addendum)
Reviewed Zio patch results ?No arrhythmia  ?Sinus tachycardia intermittently ?Start Metop '25mg'$  daily ?Upcoming cardiology appt ?Oncology is not sure if this is related to carcinoid tumor ?Consider checking for pheochromocytoma ?

## 2021-10-29 LAB — BASIC METABOLIC PANEL
BUN/Creatinine Ratio: 12 (ref 9–23)
BUN: 12 mg/dL (ref 6–20)
CO2: 26 mmol/L (ref 20–29)
Calcium: 9.8 mg/dL (ref 8.7–10.2)
Chloride: 100 mmol/L (ref 96–106)
Creatinine, Ser: 0.99 mg/dL (ref 0.57–1.00)
Glucose: 99 mg/dL (ref 70–99)
Potassium: 3.7 mmol/L (ref 3.5–5.2)
Sodium: 141 mmol/L (ref 134–144)
eGFR: 76 mL/min/{1.73_m2} (ref >59)

## 2021-11-12 ENCOUNTER — Telehealth: Payer: Managed Care, Other (non HMO) | Admitting: Nurse Practitioner

## 2021-11-12 ENCOUNTER — Ambulatory Visit: Payer: Self-pay

## 2021-11-12 DIAGNOSIS — S30861A Insect bite (nonvenomous) of abdominal wall, initial encounter: Secondary | ICD-10-CM

## 2021-11-12 DIAGNOSIS — W57XXXA Bitten or stung by nonvenomous insect and other nonvenomous arthropods, initial encounter: Secondary | ICD-10-CM

## 2021-11-12 MED ORDER — DOXYCYCLINE HYCLATE 100 MG PO TABS
100.0000 mg | ORAL_TABLET | Freq: Two times a day (BID) | ORAL | 0 refills | Status: DC
Start: 2021-11-12 — End: 2022-01-04

## 2021-11-12 NOTE — Telephone Encounter (Signed)
Chief Complaint: Tick bite Patricia Glenn female tick) Symptoms: Red, itching, size of penny on left side 6 inches above hip Frequency: Onset Sunday Pertinent Negatives: Patient denies other symptoms Disposition: '[]'$ ED /'[]'$ Urgent Care (no appt availability in office) / '[]'$ Appointment(In office/virtual)/ '[x]'$  [facility] [facility]/ '[]'$ Home Care/ '[]'$ Refused Recommended Disposition /'[]'$ [place] [facility]/ '[]'$  Follow-up with PCP Additional Notes: N/A   Reason for Disposition  Red ring or bull's-eye rash occurs at tick bite  Answer Assessment - Initial Assessment Questions 1. TYPE of TICK: "Is it a wood tick or a deer tick?" (e.g., deer tick, wood tick; unsure)     [place] tick 2. SIZE of TICK: "How big is the tick?" (e.g., size of poppy seed, apple seed, watermelon seed; unsure) Note: Deer ticks can be the size of a poppy seed (nymph) or an apple seed (adult).       Unsure 3. ENGORGED: "Did the tick look flat or engorged (full, swollen)?" (e.g., flat, engorged; unsure)     No 4. LOCATION: "Where is the tick bite located?"      Left side 6 inches between hip and armpit 5. ONSET: "How long do you think the tick was attached before you removed it?" (e.g., 5 hours, 2 days)      A few hours 6. APPEARANCE of BITE or RASH: "What does the site look like?"     Penny size, dark red (itches-mild, comes and goes) 7. PREGNANCY: "Is there any chance you are pregnant?" "When was your last menstrual period?"     No  Protocols used: Tick Bite-A-AH

## 2021-11-12 NOTE — Patient Instructions (Signed)
Tick Bite Information, Adult Ticks are insects that draw blood for food. Most ticks live in shrubs and grassy and wooded areas. They climb onto people and animals that brush against the leaves and grasses that they rest on. Then they bite, attaching themselves to the skin. Most ticks are harmless, but some ticks may carry germs that can spread to a person through a bite and cause a disease. To reduce your risk of getting a disease from a tick bite, make sure you: Take steps to prevent tick bites. Check for ticks after being outdoors where ticks live. Watch for symptoms of disease if a tick attached to you or if you suspect a tick bite. How can I prevent tick bites? Take these steps to help prevent tick bites when you go outdoors in an area where ticks live: Use insect repellent Use insect repellent that has DEET (20% or higher), picaridin, or IR3535 in it. Follow the instructions on the label. Use these products on: Bare skin. The top of your boots. Your pant legs. Your sleeve cuffs. For insect repellent that contains permethrin, follow the instructions on the label. Use these products on: Clothing. Boots. Outdoor gear. Tents. When you are outside Wear protective clothing. Long sleeves and long pants offer the best protection from ticks. Wear light-colored clothing so you can see ticks more easily. Tuck your pant legs into your socks. If you go walking on a trail, stay in the middle of the trail so your skin, hair, and clothing do not touch the bushes. Avoid walking through areas with long grass. Check for ticks on your clothing, hair, and skin often while you are outside, and check again before you go inside. Make sure to check the scalp, neck, armpits, waist, groin, and joint areas. These are the spots where ticks attach themselves most often. When you go indoors Check your clothing for ticks. Tumble dry clothes in a dryer on high heat for at least 10 minutes. If clothes are damp,  additional time may be needed. If clothes require washing, use hot water. Examine gear and pets. Shower soon after being outdoors. Check your body for ticks. Conduct a full body check using a mirror. What is the proper way to remove a tick? If you find a tick on your body, remove it as soon as possible. Removing a tick sooner can prevent germs from passing to your body. Do not remove the tick with your bare fingers. To remove a tick that is crawling on your skin but has not bitten, use either of these methods: Go outdoors and brush the tick off. Remove the tick with tape or a lint roller. To remove a tick that is attached to your skin: Wash your hands. If you have latex gloves, put them on. Use fine-tipped tweezers, curved forceps, or a tick-removal tool to gently grasp the tick as close to your skin and the tick's head as possible. Gently pull with a steady, upward, even pressure until the tick lets go. When removing the tick: Take care to keep the tick's head attached to its body. Do not twist or jerk the tick. This can make the tick's head or mouth parts break off and remain in the skin. Do not squeeze or crush the tick's body. This could force disease-carrying fluids from the tick into your body. Do not try to remove a tick with heat, alcohol, petroleum jelly, or fingernail polish. Using these methods can cause the tick to salivate and regurgitate into your bloodstream,   increasing your risk of getting a disease. What should I do after removing a tick? Dispose of the tick. Do not crush a tick with your fingers. Clean the bite area and your hands with soap and water, rubbing alcohol, or an iodine scrub. If an antiseptic cream or ointment is available, apply a small amount to the bite site. Wash and disinfect any instruments that you used to remove the tick. How should I dispose of a tick? To dispose of a live tick, use one of these methods: Place it in rubbing alcohol. Place it in a sealed  bag or container. Wrap it tightly in tape. Flush it down the toilet. Contact a health care provider if: You have symptoms of a disease after a tick bite. Symptoms of a tick-borne disease can occur from moments after the tick bites to 30 days after a tick is removed. Symptoms include: Fever or chills. Any of these signs in the bite area: A red rash that makes a circle (bull's-eye rash) in the bite area. Redness and swelling. Headache. Muscle, joint, or bone pain. Abnormal tiredness. Numbness in your legs or difficulty walking or moving your legs. Tender, swollen lymph glands. A part of a tick breaks off and gets stuck in your skin. Get help right away if: You are not able to remove a tick. You experience muscle weakness or paralysis. Your symptoms get worse or you experience new symptoms. You find an engorged tick on your skin and you are in an area where disease from ticks is a high risk. Summary Ticks may carry germs that can spread to a person through a bite and cause a disease. Wear protective clothing and use insect repellent to prevent tick bites. Follow the instructions on the label. If you find a tick on your body, remove it as soon as possible. If the tick is attached, do not try to remove with heat, alcohol, petroleum jelly, or fingernail polish. Remove the attached tick using fine-tipped tweezers, curved forceps, or a tick-removal tool. Gently pull with steady, upward, even pressure until the tick lets go. Do not twist or jerk the tick. Do not squeeze or crush the tick's body. If you have symptoms of a disease after being bitten by a tick, contact a health care provider. This information is not intended to replace advice given to you by your health care provider. Make sure you discuss any questions you have with your health care provider. Document Revised: 06/03/2019 Document Reviewed: 06/03/2019 Elsevier Patient Education  2023 [employer]  

## 2021-11-12 NOTE — Progress Notes (Signed)
Virtual Visit Consent   EDIA [name], you are scheduled for a virtual visit with Mary-Margaret Hassell Done, [place], a [place] provider, today.     Just as with appointments in the office, your consent must be obtained to participate.  Your consent will be active for this visit and any virtual visit you may have with one of our providers in the next 365 days.     If you have a MyChart account, a copy of this consent can be sent to you electronically.  All virtual visits are billed to your insurance company just like a traditional visit in the office.    As this is a virtual visit, video technology does not allow for your provider to perform a traditional examination.  This may limit your provider's ability to fully assess your condition.  If your provider identifies any concerns that need to be evaluated in person or the need to arrange testing (such as labs, EKG, etc.), we will make arrangements to do so.     Although advances in technology are sophisticated, we cannot ensure that it will always work on either your end or our end.  If the connection with a video visit is poor, the visit may have to be switched to a telephone visit.  With either a video or telephone visit, we are not always able to ensure that we have a secure connection.     I need to obtain your verbal consent now.   Are you willing to proceed with your visit today? YES   [name] has provided verbal consent on 11/12/2021 for a virtual visit (video or telephone).   Mary-Margaret Hassell Done, FNP   Date: 11/12/2021 5:39 PM   Virtual Visit via Video Note   I, Mary-Margaret Hassell Done, connected with Patricia Glenn ([id number], 06/28/1985) on 11/12/21 at  5:45 PM EDT by a video-enabled telemedicine application and verified that I am speaking with the correct person using two identifiers.  Location: Patient: Virtual Visit Location Patient: Home Provider: Virtual Visit Location Provider: Mobile   I discussed the  limitations of evaluation and management by telemedicine and the availability of in person appointments. The patient expressed understanding and agreed to proceed.    History of Present Illness: Patricia Glenn is a 36 y.o. who identifies as a female who was assigned female at birth, and is being seen today for tick bite.  HPI: Patient removed a tick from her left flank earlier today. Area is red and bruised looking. Still itches some.   Review of Systems  Constitutional:  Negative for chills, diaphoresis, fever and weight loss.  Eyes:  Negative for blurred vision, double vision and pain.  Respiratory:  Negative for shortness of breath.   Cardiovascular:  Negative for chest pain, palpitations, orthopnea and leg swelling.  Gastrointestinal:  Negative for abdominal pain.  Skin:  Negative for rash.  Neurological:  Negative for dizziness, sensory change, loss of consciousness, weakness and headaches.  Endo/Heme/Allergies:  Negative for polydipsia. Does not bruise/bleed easily.  Psychiatric/Behavioral:  Negative for memory loss. The patient does not have insomnia.   All other systems reviewed and are negative.  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  Resting comfortably  at home.  Head is normocephalic, atraumatic.  No labored breathing.  Speech is clear and coherent with logical content.  Patient is alert and oriented at baseline.  3cm annular erythematous lesion on left flank with surrounding greeniesh purple contusion  Assessment and Plan:  EVYNN BOUTELLE in today with chief complaint of Tick Removal   1. Tick bite of abdominal wall, initial encounter Actually on left flank Do not pick or scratch at area Clean with antibacterial soap  Meds ordered this encounter  Medications   doxycycline (VIBRA-TABS) 100 MG tablet    Sig: Take 1 tablet (100 mg total) by mouth 2 (two) times daily. 1 po bid    Dispense:  28 tablet    Refill:  0    Order Specific Question:   Supervising Provider    Answer:   Noemi Chapel [[id number]]        Follow Up Instructions: I discussed the assessment and treatment plan with the patient. The patient was provided an opportunity to ask questions and all were answered. The patient agreed with the plan and demonstrated an understanding of the instructions.  A copy of instructions were sent to the patient via MyChart.  The patient was advised to call back or seek an in-person evaluation if the symptoms worsen or if the condition fails to improve as anticipated.  Time:  I spent 8 minutes with the patient via telehealth technology discussing the above problems/concerns.    Mary-Margaret Hassell Done, FNP

## 2021-11-16 NOTE — Telephone Encounter (Signed)
Noted  

## 2021-11-21 DIAGNOSIS — R002 Palpitations: Secondary | ICD-10-CM | POA: Insufficient documentation

## 2021-11-21 NOTE — Progress Notes (Unsigned)
Cardiology Office Note  Date:  11/22/2021   ID:  [name], DOB [birth year], MRN [id number]  PCP:  Virginia Crews, MD   Chief Complaint  Patient presents with   New Patient (Initial Visit)    Ref by Dr. Brita Romp for palpitations and discuss 14 days Zio monitor results. Patient c/o resting HB @ 105 BPM with elevated blood pressure. Medications reviewed by the patient verbally.     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 Lavon Paganini for palpitations, sinus tachycardia, hypertension, carcinoid  Heart rate when seen by oncology September 2022 107  blood pressure at that time 160/90  Office visit with oncology August 2021 blood pressure 144/85 heart rate 107  Office visit with oncology August 2020 blood pressure 126/86 heart rate 107  Office visit with oncology July 2020 blood pressure 131/90 heart rate 102  Reports watching her heart rate with a watch, appreciates elevated heart rate Feels this is a more recent issue, not a longstanding issue He is asymptomatic Started on metoprolol succinate 25 daily by primary care also in the setting of higher blood pressure  Zio monitor October 15, 2021 HR 56 - 165, average 98 bpm. Rare supraventricular and ventricular ectopy. No sustained arrhythmias. Symptom triggered episodes correspond to sinus rhythm.  Reports on metoprolol succinate 25 daily heart rate has moved into the 90s at rest  Reports able to run marathons, asymptomatic in general Weight is stable over the past several years  Was told she might have a murmur by oncology  EKG personally reviewed by myself on todays visit Sinus tachycardia rate 105 bpm no significant ST-T wave changes  PMH:   has a past medical history of Atypical mole (07/19/2018), Carcinoid tumor metastatic to intrathoracic lymph  node ([place]) (12/2017), Encounter for induction of labor (11/17/2017), Encounter for supervision of other normal pregnancy, unspecified trimester (05/23/2017), Headache, Lung mass, Mass of lower lobe of left lung (11/17/2017), and Pneumonia complicating pregnancy, second trimester (08/29/2017).  PSH:    Past Surgical History:  Procedure Laterality Date   FLEXIBLE BRONCHOSCOPY N/A 11/29/2017   Procedure: FLEXIBLE BRONCHOSCOPY;  Surgeon: Wilhelmina Mcardle, MD;  Location: [facility];  Service: Pulmonary;  Laterality: N/A;   VIDEO ASSISTED THORACOSCOPY (VATS)/WEDGE RESECTION Left 12/29/2017   Procedure: Left VIDEO ASSISTED THORACOSCOPY with Mini Thoracotomy, Left Lower Lobe Lobectomy, Node Dissection,  Insertion of  ONQ pain pump;  Surgeon: Grace Isaac, MD;  Location: [facility];  Service: Thoracic;  Laterality: Left;   VIDEO BRONCHOSCOPY N/A 12/29/2017   Procedure: VIDEO BRONCHOSCOPY;  Surgeon: Grace Isaac, MD;  Location: [facility];  Service: Thoracic;  Laterality: N/A;    Current Outpatient Medications  Medication Sig Dispense Refill   doxycycline (VIBRA-TABS) 100 MG tablet Take 1 tablet (100 mg total) by mouth 2 (two) times daily. 1 po bid 28 tablet 0   hydrochlorothiazide (HYDRODIURIL) 25 MG tablet Take 1 tablet (25 mg total) by mouth daily. 90 tablet 3   levonorgestrel (LILETTA, 52 MG,) 20.1 MCG/DAY IUD by Intrauterine route.     metoprolol succinate (TOPROL-XL) 25 MG 24 hr tablet Take 1 tablet (25 mg total) by mouth daily. 90 tablet 3   Multiple Vitamin (MULTI-VITAMIN DAILY PO) Take 1 tablet by mouth daily.     No current facility-administered medications for this visit.     Allergies:   Patient has no  known allergies.   Social History:  The patient  reports that she has never smoked. She has never used smokeless tobacco. She reports current alcohol use. She reports that she does not use drugs.   Family History:   family history includes Bladder Cancer (age of onset: 33) in her paternal  grandfather; Diabetes in her maternal grandmother; Heart disease (age of onset: 22) in her paternal grandmother; Kidney disease in her father; Parkinson's disease in her maternal grandmother; Polycystic kidney disease in her paternal grandfather and another family member; Prostate cancer (age of onset: 70) in her father.    Review of Systems: Review of Systems  Constitutional: Negative.   HENT: Negative.    Respiratory: Negative.    Cardiovascular: Negative.   Gastrointestinal: Negative.   Musculoskeletal: Negative.   Neurological: Negative.   Psychiatric/Behavioral: Negative.    All other systems reviewed and are negative.   PHYSICAL EXAM: VS:  BP (!) 152/106 (BP Location: Right Arm, Patient Position: Sitting, Cuff Size: Normal)   Pulse (!) 105   Ht _0  (1.727 m)   Wt 222 lb (100.7 kg)   SpO2 98%   BMI 33.75 kg/m  , BMI Body mass index is 33.75 kg/m. GEN: Well nourished, well developed, in no acute distress HEENT: normal Neck: no JVD, carotid bruits, or masses Cardiac: RRR; no murmurs, rubs, or gallops,no edema  Respiratory:  clear to auscultation bilaterally, normal work of breathing GI: soft, nontender, nondistended, + BS MS: no deformity or atrophy Skin: warm and dry, no rash Neuro:  Strength and sensation are intact Psych: euthymic mood, full affect   Recent Labs: 09/21/2021: ALT 23; Hemoglobin 14.7; Platelets 272; TSH 2.410 10/28/2021: BUN 12; Creatinine, Ser 0.99; Potassium 3.7; Sodium 141    Lipid Panel Lab Results  Component Value Date   CHOL 160 04/15/2020   HDL 31 (L) 04/15/2020   LDLCALC 93 04/15/2020   TRIG 208 (H) 04/15/2020      Wt Readings from Last 3 Encounters:  11/22/21 222 lb (100.7 kg)  10/28/21 221 lb 8 oz (100.5 kg)  09/21/21 222 lb (100.7 kg)       ASSESSMENT AND PLAN:  Problem List Items Addressed This Visit       Cardiology Problems   Hypertension - Primary   Relevant Orders   EKG 12-Lead     Other   Palpitations    Relevant Orders   EKG 12-Lead   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  Mild improvement in rate on metoprolol succinate 25 daily Recommend close monitoring of blood pressure and heart rate at home and call us with numbers Potentially could increase metoprolol succinate to higher dosing for both blood pressure and heart rate control  Carcinoid syndrome Recommended baseline echocardiogram to rule out structural heart disease, valvular disease  Essential hypertension Currently on low-dose HCTZ with metoprolol succinate She has a new blood pressure cuff, recommend she call us with some numbers Potentially could go on higher dose metoprolol succinate, alternatively could try bystolic for heart rate and blood pressure control in one pill    Total encounter time more than 60 minutes  Greater than 50% was spent in counseling and coordination of care with the patient  Patient seen in consultation for  Dr. Lavon Paganini and will be referred back to her office for ongoing care of the issues  detailed above   Signed, Esmond Plants, M.D., Ph.D. [place], [place]

## 2021-11-22 ENCOUNTER — Ambulatory Visit (INDEPENDENT_AMBULATORY_CARE_PROVIDER_SITE_OTHER): Payer: Managed Care, Other (non HMO) | Admitting: Cardiovascular Disease

## 2021-11-22 ENCOUNTER — Encounter: Payer: Self-pay | Admitting: Cardiovascular Disease

## 2021-11-22 VITALS — BP 152/106 | HR 105 | Ht 68.0 in | Wt 222.0 lb

## 2021-11-22 DIAGNOSIS — R002 Palpitations: Secondary | ICD-10-CM

## 2021-11-22 DIAGNOSIS — I10 Essential (primary) hypertension: Secondary | ICD-10-CM | POA: Diagnosis not present

## 2021-11-22 DIAGNOSIS — R000 Tachycardia, unspecified: Secondary | ICD-10-CM

## 2021-11-22 DIAGNOSIS — E340 Carcinoid syndrome: Secondary | ICD-10-CM | POA: Diagnosis not present

## 2021-11-22 NOTE — Patient Instructions (Addendum)
Medication Instructions:  No changes  If you need a refill on your cardiac medications before your next appointment, please call your pharmacy.   Lab work: No new labs needed  Testing/Procedures:  Your physician has requested that you have an echocardiogram. Echocardiography is a painless test that uses sound waves to create images of your heart. It provides your doctor with information about the size and shape of your heart and how well your heart's chambers and valves are working. This procedure takes approximately one hour. There are no restrictions for this procedure.   Follow-Up: At [facility], you and your health needs are our priority.  As part of our continuing mission to provide you with exceptional heart care, we have created designated Provider Care Teams.  These Care Teams include your primary Cardiologist (physician) and Advanced Practice Providers (APPs -  Physician Assistants and Nurse Practitioners) who all work together to provide you with the care you need, when you need it.  You will need a follow up appointment as needed  Providers on your designated Care Team:   Christopher Berge, NP Ryan Dunn, PA-C Cadence Furth, PA-C  COVID-19 Vaccine Information can be found at: [email or web address] For questions related to vaccine distribution or appointments, please email [email or web address] or call [phone].   

## 2021-12-29 ENCOUNTER — Encounter: Payer: Self-pay | Admitting: Family Medicine

## 2022-01-04 ENCOUNTER — Encounter: Payer: Self-pay | Admitting: Family Medicine

## 2022-01-04 ENCOUNTER — Ambulatory Visit (INDEPENDENT_AMBULATORY_CARE_PROVIDER_SITE_OTHER): Payer: Managed Care, Other (non HMO) | Admitting: Family Medicine

## 2022-01-04 VITALS — BP 131/85 | HR 111 | Temp 98.4°F | Resp 16 | Wt 215.0 lb

## 2022-01-04 DIAGNOSIS — J189 Pneumonia, unspecified organism: Secondary | ICD-10-CM | POA: Diagnosis not present

## 2022-01-04 DIAGNOSIS — E340 Carcinoid syndrome: Secondary | ICD-10-CM | POA: Diagnosis not present

## 2022-01-04 DIAGNOSIS — H8303 Labyrinthitis, bilateral: Secondary | ICD-10-CM

## 2022-01-04 DIAGNOSIS — C7B09 Secondary carcinoid tumors of other sites: Secondary | ICD-10-CM

## 2022-01-04 DIAGNOSIS — R000 Tachycardia, unspecified: Secondary | ICD-10-CM | POA: Diagnosis not present

## 2022-01-04 DIAGNOSIS — C7A00 Malignant carcinoid tumor of unspecified site: Secondary | ICD-10-CM | POA: Diagnosis not present

## 2022-01-04 DIAGNOSIS — I10 Essential (primary) hypertension: Secondary | ICD-10-CM

## 2022-01-04 MED ORDER — OPTICHAMBER DIAMOND MISC: 1 refills | Status: DC

## 2022-01-04 MED ORDER — PREDNISONE 10 MG PO TABS: ORAL_TABLET | ORAL | 0 refills | Status: DC

## 2022-01-04 MED ORDER — FLUTICASONE PROPIONATE 50 MCG/ACT NA SUSP: 2.0000 | Freq: Every day | NASAL | 6 refills | Status: DC

## 2022-01-04 MED ORDER — MONTELUKAST SODIUM 10 MG PO TABS: 10.0000 mg | ORAL_TABLET | Freq: Every day | ORAL | 3 refills | Status: DC

## 2022-01-04 MED ORDER — BREZTRI AEROSPHERE 160-9-4.8 MCG/ACT IN AERO: 2.0000 | INHALATION_SPRAY | Freq: Two times a day (BID) | RESPIRATORY_TRACT | 11 refills | Status: DC

## 2022-01-04 NOTE — Assessment & Plan Note (Signed)
Chronic, stable Continue Toprol 25 mg BP at goal; tachycardia remains; however, currently with infectious process

## 2022-01-04 NOTE — Assessment & Plan Note (Signed)
Acute, wheezing and rhonchi heard bilaterally Report of fevering to 103 with use of Motrin Will refer back to pulm- Dr Raul Del, out of contact for years Add singulair to assist Encourage use of mucinex DM and increased water intake Add prednisone taper Repeat CXR in 2 weeks following completion of abx and steroid  Samples of breztri provided; previous coughing spells with albuterol which lead to nausea d/t bronchospasm

## 2022-01-04 NOTE — Progress Notes (Signed)
I,Sulibeya S Dimas,acting as a Education administrator for Patricia Sprout, FNP.,have documented all relevant documentation on the behalf of Patricia Sprout, FNP,as directed by  Patricia Sprout, FNP while in the presence of Patricia Sprout, FNP.   Established patient visit   Patient: Patricia Glenn   DOB: 11-Jul-1985   36 y.o. Female  MRN: [id number] Visit Date: 01/04/2022  Today's healthcare provider: Gwyneth Sprout, FNP  Introduced to nurse practitioner role and practice setting.  All questions answered.  Discussed provider/patient relationship and expectations.   Chief Complaint  Patient presents with   Cough   Subjective    HPI  Follow up for pneumonia  The patient was last seen for this 1 weeks ago at Urgent Care. Changes made at last visit include levofloxacin, albuterol, and benzonatate.  She reports good compliance with treatment. Patient reports she still has 2 days left of levofloxacin. She reports not using benzonatate. She feels that condition is Improved. Patient reports cough is better, still having shortness of breath and wheezing. She is having side effects. Vomiting with albuterol.    XR Chest 2 Views  Anatomical Region Laterality Modality  Chest -- Digital Radiography   Narrative  Patchy infiltrate to left upper lobe.   Per radiology report: Patchy left upper lobe and lingular infiltrate with  small effusion.  No pneumothorax.  Minimal right upper lobe and left  basilar atelectasis.  Shallow inspiratory depth.  No acute osseous   abnormality Exam End: 12/28/21 23:30   -----------------------------------------------------------------------------------------   Medications: Outpatient Medications Prior to Visit  Medication Sig   benzonatate (TESSALON) 200 MG capsule Take by mouth.   hydrochlorothiazide (HYDRODIURIL) 25 MG tablet Take 1 tablet (25 mg total) by mouth daily.   levofloxacin (LEVAQUIN) 500 MG tablet Take by mouth.   levonorgestrel (LILETTA, 52 MG,) 20.1  MCG/DAY IUD by Intrauterine route.   metoprolol succinate (TOPROL-XL) 25 MG 24 hr tablet Take 1 tablet (25 mg total) by mouth daily.   Multiple Vitamin (MULTI-VITAMIN DAILY PO) Take 1 tablet by mouth daily.   albuterol (VENTOLIN HFA) 108 (90 Base) MCG/ACT inhaler Inhale into the lungs. (Patient not taking: Reported on 01/04/2022)   [DISCONTINUED] doxycycline (VIBRA-TABS) 100 MG tablet Take 1 tablet (100 mg total) by mouth 2 (two) times daily. 1 po bid   No facility-administered medications prior to visit.    Review of Systems  Constitutional:  Positive for appetite change and fatigue. Negative for chills.  HENT:  Negative for ear pain, postnasal drip, sinus pressure, sinus pain and sore throat.   Respiratory:  Positive for cough, chest tightness, shortness of breath and wheezing.   Cardiovascular:  Negative for chest pain and palpitations.  Gastrointestinal:  Positive for nausea and vomiting. Negative for abdominal pain.       Objective    BP 131/85 (BP Location: Left Arm, Patient Position: Sitting, Cuff Size: Large)   Pulse (!) 111   Temp 98.4 F (36.9 C) (Oral)   Resp 16   Wt 215 lb (97.5 kg)   SpO2 98%   BMI 32.69 kg/m  BP Readings from Last 3 Encounters:  01/04/22 131/85  11/22/21 (!) 152/106  10/28/21 138/80   Wt Readings from Last 3 Encounters:  01/04/22 215 lb (97.5 kg)  11/22/21 222 lb (100.7 kg)  10/28/21 221 lb 8 oz (100.5 kg)      Physical Exam Vitals and nursing note reviewed.  Constitutional:      General: She is not  in acute distress.    Appearance: Normal appearance. She is obese. She is not ill-appearing, toxic-appearing or diaphoretic.  HENT:     Head: Normocephalic and atraumatic.     Right Ear: Tympanic membrane and external ear normal. Swelling present. There is no impacted cerumen.     Left Ear: Tympanic membrane and external ear normal. Swelling present. There is no impacted cerumen.     Nose: Nose normal.     Mouth/Throat:     Mouth: Mucous  membranes are moist.     Pharynx: Oropharynx is clear. No oropharyngeal exudate or posterior oropharyngeal erythema.  Cardiovascular:     Rate and Rhythm: Regular rhythm. Tachycardia present.     Pulses: Normal pulses.     Heart sounds: Normal heart sounds. No murmur heard.    No friction rub. No gallop.  Pulmonary:     Effort: Pulmonary effort is normal. No respiratory distress.     Breath sounds: Decreased air movement present. No stridor. Examination of the right-upper field reveals wheezing and rhonchi. Examination of the left-upper field reveals wheezing and rhonchi. Examination of the right-middle field reveals decreased breath sounds, wheezing and rhonchi. Examination of the right-lower field reveals decreased breath sounds, wheezing and rhonchi. Examination of the left-lower field reveals decreased breath sounds, wheezing and rhonchi. Decreased breath sounds, wheezing and rhonchi present. No rales.  Chest:     Chest wall: No tenderness.  Musculoskeletal:        General: No swelling, tenderness, deformity or signs of injury. Normal range of motion.     Right lower leg: No edema.     Left lower leg: No edema.  Skin:    General: Skin is warm and dry.     Capillary Refill: Capillary refill takes less than 2 seconds.     Coloration: Skin is not jaundiced or pale.     Findings: No bruising, erythema, lesion or rash.  Neurological:     General: No focal deficit present.     Mental Status: She is alert and oriented to person, place, and time. Mental status is at baseline.     Cranial Nerves: No cranial nerve deficit.     Sensory: No sensory deficit.     Motor: No weakness.     Coordination: Coordination normal.  Psychiatric:        Mood and Affect: Mood normal.        Behavior: Behavior normal.        Thought Content: Thought content normal.        Judgment: Judgment normal.       No results found for any visits on 01/04/22.  Assessment & Plan     Problem List Items Addressed  This Visit       Cardiovascular and Mediastinum   Hypertension    Chronic, stable Continue Toprol 25 mg BP at goal; tachycardia remains; however, currently with infectious process       Relevant Orders   DG Chest 2 View     Respiratory   Community acquired pneumonia of left lung - Primary    Acute, wheezing and rhonchi heard bilaterally Report of fevering to 103 with use of Motrin Will refer back to pulm- Dr Raul Del, out of contact for years Add singulair to assist Encourage use of mucinex DM and increased water intake Add prednisone taper Repeat CXR in 2 weeks following completion of abx and steroid  Samples of breztri provided; previous coughing spells with albuterol which lead to nausea d/t  bronchospasm       Relevant Medications   benzonatate (TESSALON) 200 MG capsule   levofloxacin (LEVAQUIN) 500 MG tablet   albuterol (VENTOLIN HFA) 108 (90 Base) MCG/ACT inhaler   predniSONE (DELTASONE) 10 MG tablet   Budeson-Glycopyrrol-Formoterol (BREZTRI AEROSPHERE) 160-9-4.8 MCG/ACT AERO   montelukast (SINGULAIR) 10 MG tablet   Spacer/Aero-Holding Chambers (OPTICHAMBER DIAMOND) MISC   fluticasone (FLONASE) 50 MCG/ACT nasal spray   Other Relevant Orders   Ambulatory referral to Pulmonology   DG Chest 2 View     Nervous and Auditory   Inner ear inflammation, bilateral    Has been masking; denies symptoms Recommend use of flonase       Relevant Medications   fluticasone (FLONASE) 50 MCG/ACT nasal spray     Immune and Lymphatic   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 ([facility])    Chronic, previous resection in 2019 Is followed by hematology and oncology Complaints of tachycardia, HR today of 111 HTN is well controlled       Relevant Medications   levofloxacin (LEVAQUIN) 500 MG tablet   predniSONE (DELTASONE) 10 MG tablet   Spacer/Aero-Holding Chambers (OPTICHAMBER DIAMOND) MISC   Other Relevant Orders   DG Chest 2 View     Other   Carcinoid syndrome  ([facility])    Chronic, stable Reports flushing- however, acute infectious process Followed by hem and onc       Relevant Medications   levofloxacin (LEVAQUIN) 500 MG tablet   predniSONE (DELTASONE) 10 MG tablet   Spacer/Aero-Holding Chambers (OPTICHAMBER DIAMOND) MISC   Other Relevant Orders   DG Chest 2 View   Sinus tachycardia    Chronic, stable Continue toprol 25 mg Has upcoming ECHO with cards on Friday      Relevant Orders   DG Chest 2 View     Return in about 3 weeks (around 01/25/2022), or if symptoms worsen or fail to improve, for PNA f/u.      Vonna Kotyk, FNP, have reviewed all documentation for this visit. The documentation on 01/04/22 for the exam, diagnosis, procedures, and orders are all accurate and complete.    Patricia Glenn, [place] [phone] (phone) [phone] (fax)  [place]

## 2022-01-04 NOTE — Assessment & Plan Note (Signed)
Chronic, stable Reports flushing- however, acute infectious process Followed by hem and onc

## 2022-01-04 NOTE — Assessment & Plan Note (Signed)
Chronic, previous resection in 2019 Is followed by hematology and oncology Complaints of tachycardia, HR today of 111 HTN is well controlled

## 2022-01-04 NOTE — Assessment & Plan Note (Signed)
Chronic, stable Continue toprol 25 mg Has upcoming ECHO with cards on Friday

## 2022-01-04 NOTE — Assessment & Plan Note (Signed)
Has been masking; denies symptoms Recommend use of flonase

## 2022-01-07 ENCOUNTER — Ambulatory Visit (INDEPENDENT_AMBULATORY_CARE_PROVIDER_SITE_OTHER): Payer: Managed Care, Other (non HMO)

## 2022-01-07 DIAGNOSIS — R000 Tachycardia, unspecified: Secondary | ICD-10-CM | POA: Diagnosis not present

## 2022-01-07 DIAGNOSIS — R002 Palpitations: Secondary | ICD-10-CM

## 2022-01-07 DIAGNOSIS — E340 Carcinoid syndrome: Secondary | ICD-10-CM

## 2022-01-11 ENCOUNTER — Encounter: Payer: Self-pay | Admitting: Cardiovascular Disease

## 2022-01-27 LAB — ECHOCARDIOGRAM COMPLETE
AR max vel: 3.57 cm2
AV Area VTI: 3.2 cm2
AV Area mean vel: 3.43 cm2
AV Mean grad: 3 mmHg
AV Peak grad: 5.3 mmHg
Ao pk vel: 1.15 m/s
Area-P 1/2: 7.16 cm2
S' Lateral: 2.8 cm

## 2022-01-31 ENCOUNTER — Ambulatory Visit: Payer: Managed Care, Other (non HMO) | Admitting: Family Medicine

## 2022-02-22 ENCOUNTER — Encounter: Payer: Self-pay | Admitting: Cardiovascular Disease

## 2022-02-28 ENCOUNTER — Other Ambulatory Visit: Payer: Self-pay | Admitting: *Deleted

## 2022-02-28 MED ORDER — METOPROLOL SUCCINATE ER 50 MG PO TB24: 50.0000 mg | ORAL_TABLET | Freq: Every day | ORAL | 1 refills | Status: DC

## 2022-07-28 ENCOUNTER — Encounter: Payer: Self-pay | Admitting: Dermatology

## 2022-07-28 ENCOUNTER — Ambulatory Visit (INDEPENDENT_AMBULATORY_CARE_PROVIDER_SITE_OTHER): Payer: Managed Care, Other (non HMO) | Admitting: Dermatology

## 2022-07-28 VITALS — BP 159/98 | HR 89

## 2022-07-28 DIAGNOSIS — Z1283 Encounter for screening for malignant neoplasm of skin: Secondary | ICD-10-CM | POA: Diagnosis not present

## 2022-07-28 DIAGNOSIS — Z86018 Personal history of other benign neoplasm: Secondary | ICD-10-CM | POA: Diagnosis not present

## 2022-07-28 DIAGNOSIS — L821 Other seborrheic keratosis: Secondary | ICD-10-CM

## 2022-07-28 DIAGNOSIS — D2272 Melanocytic nevi of left lower limb, including hip: Secondary | ICD-10-CM

## 2022-07-28 DIAGNOSIS — D2222 Melanocytic nevi of left ear and external auricular canal: Secondary | ICD-10-CM

## 2022-07-28 DIAGNOSIS — D225 Melanocytic nevi of trunk: Secondary | ICD-10-CM | POA: Diagnosis not present

## 2022-07-28 DIAGNOSIS — D239 Other benign neoplasm of skin, unspecified: Secondary | ICD-10-CM

## 2022-07-28 DIAGNOSIS — D485 Neoplasm of uncertain behavior of skin: Secondary | ICD-10-CM

## 2022-07-28 DIAGNOSIS — L578 Other skin changes due to chronic exposure to nonionizing radiation: Secondary | ICD-10-CM

## 2022-07-28 DIAGNOSIS — L814 Other melanin hyperpigmentation: Secondary | ICD-10-CM

## 2022-07-28 DIAGNOSIS — D229 Melanocytic nevi, unspecified: Secondary | ICD-10-CM

## 2022-07-28 HISTORY — DX: Other benign neoplasm of skin, unspecified: D23.9

## 2022-07-28 NOTE — Progress Notes (Signed)
Follow-Up Visit   Subjective  Patricia Glenn is a 37 y.o. female who presents for the following: FBSE (Hx dysplastic nevus. Patient with history of pulmonary atypical carcinoid, followed by PCP and oncology.).  The patient presents for Total-Body Skin Exam (TBSE) for skin cancer screening and mole check.  The patient has spots, moles and lesions to be evaluated, some may be new or changing and the patient has concerns that these could be cancer.   The following portions of the chart were reviewed this encounter and updated as appropriate:   Tobacco  Allergies  Meds  Problems  Med Hx  Surg Hx  Fam Hx      Review of Systems:  No other skin or systemic complaints except as noted in HPI or Assessment and Plan.  Objective  Well appearing patient in no apparent distress; mood and affect are within normal limits.  A full examination was performed including scalp, head, eyes, ears, nose, lips, neck, chest, axillae, abdomen, back, buttocks, bilateral upper extremities, bilateral lower extremities, hands, feet, fingers, toes, fingernails, and toenails. All findings within normal limits unless otherwise noted below.  Left Thigh 0.6cm thin tan papule   Right Helix 0.5 cm medium brown thin papule, slightly darker at 12 o'clock  right lateral abdomen 0.5 cm medium to dark brown thin papule R/o Atypia       Assessment & Plan  Nevus (2) Right Helix; Left Thigh  Benign-appearing.  Observation.  Call clinic for new or changing lesions.  Recommend daily use of broad spectrum spf 30+ sunscreen to sun-exposed areas.    Neoplasm of uncertain behavior of skin right lateral abdomen  Epidermal / dermal shaving  Lesion diameter (cm):  0.5 Informed consent: discussed and consent obtained   Timeout: patient name, date of birth, surgical site, and procedure verified   Anesthesia: the lesion was anesthetized in a standard fashion   Anesthetic:  1% lidocaine w/ epinephrine 1-100,000  local infiltration Instrument used: flexible razor blade   Hemostasis achieved with: aluminum chloride   Outcome: patient tolerated procedure well   Post-procedure details: wound care instructions given   Additional details:  Mupirocin and a bandage applied  Specimen 1 - Surgical pathology Differential Diagnosis: R/o Atypia  Check Margins: No 0.5 cm medium to dark brown thin papule 2 specimens    History of Dysplastic Nevi - No evidence of recurrence today - Recommend regular full body skin exams - Recommend daily broad spectrum sunscreen SPF 30+ to sun-exposed areas, reapply every 2 hours as needed.  - Call if any new or changing lesions are noted between office visits  Lentigines - Scattered tan macules - Due to sun exposure - Benign-appearing, observe - Recommend daily broad spectrum sunscreen SPF 30+ to sun-exposed areas, reapply every 2 hours as needed. - Call for any changes  Seborrheic Keratoses - Stuck-on, waxy, tan-brown papules and/or plaques  - Benign-appearing - Discussed benign etiology and prognosis. - Observe - Call for any changes  Melanocytic Nevi - Tan-brown and/or pink-flesh-colored symmetric macules and papules - Benign appearing on exam today - Observation - Call clinic for new or changing moles - Recommend daily use of broad spectrum spf 30+ sunscreen to sun-exposed areas.   Hemangiomas - Red papules - Discussed benign nature - Observe - Call for any changes  Actinic Damage - Chronic condition, secondary to cumulative UV/sun exposure - diffuse scaly erythematous macules with underlying dyspigmentation - Recommend daily broad spectrum sunscreen SPF 30+ to sun-exposed areas, reapply every  2 hours as needed.  - Staying in the shade or wearing long sleeves, sun glasses (UVA+UVB protection) and wide brim hats (4-inch brim around the entire circumference of the hat) are also recommended for sun protection.  - Call for new or changing  lesions.  Skin cancer screening performed today.  Return in about 1 year (around 07/29/2023) for TBSE, Hx Dysplastic Nevi.  Graciella Belton, RMA, am acting as scribe for Forest Gleason, MD .  Documentation: I have reviewed the above documentation for accuracy and completeness, and I agree with the above.  Forest Gleason, MD

## 2022-07-28 NOTE — Patient Instructions (Addendum)
Recommend Vitamin D 600iu daily.  Recommend taking Heliocare sun protection supplement daily in sunny weather for additional sun protection. For maximum protection on the sunniest days, you can take up to 2 capsules of regular Heliocare OR take 1 capsule of Heliocare Ultra. For prolonged exposure (such as a full day in the sun), you can repeat your dose of the supplement 4 hours after your first dose. Heliocare can be purchased at [employer], at some [facility] or at [email or web address].    Wound Care Instructions  Cleanse wound gently with soap and water once a day then pat dry with clean gauze. Apply a thin coat of Petrolatum (petroleum jelly, "Vaseline") over the wound (unless you have an allergy to this). We recommend that you use a new, sterile tube of Vaseline. Do not pick or remove scabs. Do not remove the yellow or white "healing tissue" from the base of the wound.  Cover the wound with fresh, clean, nonstick gauze and secure with paper tape. You may use Band-Aids in place of gauze and tape if the wound is small enough, but would recommend trimming much of the tape off as there is often too much. Sometimes Band-Aids can irritate the skin.  You should call the office for your biopsy report after 1 week if you have not already been contacted.  If you experience any problems, such as abnormal amounts of bleeding, swelling, significant bruising, significant pain, or evidence of infection, please call the office immediately.  FOR ADULT SURGERY PATIENTS: If you need something for pain relief you may take 1 extra strength Tylenol (acetaminophen) AND 2 Ibuprofen ('200mg'$  each) together every 4 hours as needed for pain. (do not take these if you are allergic to them or if you have a reason you should not take them.) Typically, you may only need pain medication for 1 to 3 days.    Melanoma ABCDEs  Melanoma is the most dangerous type of skin cancer, and is the leading cause of death from skin  disease.  You are more likely to develop melanoma if you: Have light-colored skin, light-colored eyes, or red or blond hair Spend a lot of time in the sun Tan regularly, either outdoors or in a tanning bed Have had blistering sunburns, especially during childhood Have a close family member who has had a melanoma Have atypical moles or large birthmarks  Early detection of melanoma is key since treatment is typically straightforward and cure rates are extremely high if we catch it early.   The first sign of melanoma is often a change in a mole or a new dark spot.  The ABCDE system is a way of remembering the signs of melanoma.  A for asymmetry:  The two halves do not match. B for border:  The edges of the growth are irregular. C for color:  A mixture of colors are present instead of an even brown color. D for diameter:  Melanomas are usually (but not always) greater than 42m - the size of a pencil eraser. E for evolution:  The spot keeps changing in size, shape, and color.  Please check your skin once per month between visits. You can use a small mirror in front and a large mirror behind you to keep an eye on the back side or your body.   If you see any new or changing lesions before your next follow-up, please call to schedule a visit.  Please continue daily skin protection including broad spectrum sunscreen SPF 30+ to  sun-exposed areas, reapplying every 2 hours as needed when you're outdoors.    Due to recent changes in healthcare laws, you may see results of your pathology and/or laboratory studies on MyChart before the doctors have had a chance to review them. We understand that in some cases there may be results that are confusing or concerning to you. Please understand that not all results are received at the same time and often the doctors may need to interpret multiple results in order to provide you with the best plan of care or course of treatment. Therefore, we ask that you please  give [place] 2 business days to thoroughly review all your results before contacting the office for clarification. Should we see a critical lab result, you will be contacted sooner.   If You Need Anything After Your Visit  If you have any questions or concerns for your doctor, please call our main line at [phone] and press option 4 to reach your doctor's medical assistant. If no one answers, please leave a voicemail as directed and we will return your call as soon as possible. Messages left after 4 pm will be answered the following business day.   You may also send [place] a message via [address]. We typically respond to MyChart messages within 1-2 business days.  For prescription refills, please ask your pharmacy to contact our office. Our fax number is [phone].  If you have an urgent issue when the clinic is closed that cannot wait until the next business day, you can page your doctor at the number below.    Please note that while we do our best to be available for urgent issues outside of office hours, we are not available 24/7.   If you have an urgent issue and are unable to reach [place], you may choose to seek medical care at your doctor's office, retail clinic, urgent care center, or emergency room.  If you have a medical emergency, please immediately call 911 or go to the emergency department.  Pager Numbers  - Dr. Nehemiah Massed: [phone]  - Dr. Laurence Ferrari: [phone]  - Dr. Nicole Kindred: [phone]  In the event of inclement weather, please call our main line at [phone] for an update on the status of any delays or closures.  Dermatology Medication Tips: Please keep the boxes that topical medications come in in order to help keep track of the instructions about where and how to use these. Pharmacies typically print the medication instructions only on the boxes and not directly on the medication tubes.   If your medication is too expensive, please contact our office at [phone]  option 4 or send [place] a message through [place].   We are unable to tell what your co-pay for medications will be in advance as this is different depending on your insurance coverage. However, we may be able to find a substitute medication at lower cost or fill out paperwork to get insurance to cover a needed medication.   If a prior authorization is required to get your medication covered by your insurance company, please allow [place] 1-2 business days to complete this process.  Drug prices often vary depending on where the prescription is filled and some pharmacies may offer cheaper prices.  The website www.goodrx.com contains coupons for medications through different pharmacies. The prices here do not account for what the cost may be with help from insurance (it may be cheaper with your insurance), but the website can give you the price if you did not  use any insurance.  - You can print the associated coupon and take it with your prescription to the pharmacy.  - You may also stop by our office during regular business hours and pick up a GoodRx coupon card.  - If you need your prescription sent electronically to a different pharmacy, notify our office through [facility] or by phone at [phone] option 4.     Si Usted Necesita Algo Despus de Su Visita  Tambin puede enviarnos un mensaje a travs de Pharmacist, community. Por lo general respondemos a los mensajes de MyChart en el transcurso de 1 a 2 das hbiles.  Para renovar recetas, por favor pida a su farmacia que se ponga en contacto con nuestra oficina. Harland Dingwall de fax es [place] [phone].  Si tiene un asunto urgente cuando la clnica est cerrada y que no puede esperar hasta el siguiente da hbil, puede llamar/localizar a su doctor(a) al nmero que aparece a continuacin.   Por favor, tenga en cuenta que aunque hacemos todo lo posible para estar disponibles para asuntos urgentes fuera del horario de [place], no estamos disponibles las  24 horas del da, los 7 das de la [place].   Si tiene un problema urgente y no puede comunicarse con nosotros, puede optar por buscar atencin mdica  en el consultorio de su doctor(a), en una clnica privada, en un centro de atencin urgente o en una sala de emergencias.  Si tiene Engineering geologist, por favor llame inmediatamente al 911 o vaya a la sala de emergencias.  Nmeros de bper  - Dr. Nehemiah Massed: [phone]  - Dra. Moye: [phone]  - Dra. Nicole Kindred: [phone]  En caso de inclemencias del [place], por favor llame a Johnsie Kindred principal al [phone] para una actualizacin sobre el [place] de cualquier retraso o cierre.  Consejos para la medicacin en dermatologa: Por favor, guarde las cajas en las que vienen los medicamentos de uso tpico para ayudarle a seguir las instrucciones sobre dnde y cmo usarlos. Las farmacias generalmente imprimen las instrucciones del medicamento slo en las cajas y no directamente en los tubos del [place].   Si su medicamento es muy caro, por favor, pngase en contacto con Zigmund Daniel llamando al [phone] y presione la opcin 4 o envenos un mensaje a travs de Pharmacist, community.   No podemos decirle cul ser su copago por los medicamentos por adelantado ya que esto es diferente dependiendo de la cobertura de su seguro. Sin embargo, es posible que podamos encontrar un medicamento sustituto a Electrical engineer un formulario para que el seguro cubra el medicamento que se considera necesario.   Si se requiere una autorizacin previa para que su compaa de seguros [place] su medicamento, por favor permtanos de 1 a 2 das hbiles para completar este proceso.  Los precios de los medicamentos varan con frecuencia dependiendo del Environmental consultant de dnde se surte la receta y alguna farmacias pueden ofrecer precios ms baratos.  El sitio web www.goodrx.com tiene cupones para medicamentos de Airline pilot. Los precios aqu no tienen en cuenta lo  que podra costar con la ayuda del seguro (puede ser ms barato con su seguro), pero el sitio web puede darle el precio si no utiliz Research scientist (physical sciences).  - Puede imprimir el cupn correspondiente y llevarlo con su receta a la farmacia.  - Tambin puede pasar por nuestra oficina durante el horario de atencin regular y Charity fundraiser una tarjeta de cupones de GoodRx.  - Si necesita que su receta se enve electrnicamente  a [employer], informe a nuestra oficina a travs de MyChart de [place] o por telfono llamando al [phone] y presione la opcin 4.

## 2022-08-02 ENCOUNTER — Telehealth: Payer: Self-pay

## 2022-08-02 NOTE — Telephone Encounter (Signed)
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 On the spectrum from normal mole to melanoma skin cancer, this is in between the two. - We need to recheck this area sometime in the next 6 months to be sure there is no evidence of the atypical mole coming back. If there is any color coming back, we would recommend repeating the biopsy to be sure the cells look normal.  - People who have a history of atypical moles do have a slightly increased risk of developing melanoma somewhere on the body, so a yearly full body skin exam by a dermatologist is recommended.  - Monthly self skin checks and daily sun protection are also recommended.  - Please call if you notice a dark spot coming back where this biopsy was taken.  - Please also call if you notice any new or changing spots anywhere else on the body before your follow-up visit.     MAs please call with results and schedule. Thank you!

## 2022-08-02 NOTE — Telephone Encounter (Signed)
Patient advised pathology showed moderately dysplastic nevus. Scheduled to recheck 01/26/23. Lurlean Horns., RMA

## 2022-08-15 ENCOUNTER — Other Ambulatory Visit (HOSPITAL_COMMUNITY)
Admission: RE | Admit: 2022-08-15 | Discharge: 2022-08-15 | Disposition: A | Discharge status: Home / Self Care | Payer: Managed Care, Other (non HMO) | Source: Ambulatory Visit | Attending: Family Medicine | Admitting: Family Medicine

## 2022-08-15 ENCOUNTER — Encounter: Payer: Self-pay | Admitting: Family Medicine

## 2022-08-15 ENCOUNTER — Ambulatory Visit (INDEPENDENT_AMBULATORY_CARE_PROVIDER_SITE_OTHER): Payer: Managed Care, Other (non HMO) | Admitting: Family Medicine

## 2022-08-15 VITALS — BP 134/87 | HR 91 | Temp 97.8°F | Resp 16 | Ht 68.0 in | Wt 223.5 lb

## 2022-08-15 DIAGNOSIS — Z23 Encounter for immunization: Secondary | ICD-10-CM

## 2022-08-15 DIAGNOSIS — C7B09 Secondary carcinoid tumors of other sites: Secondary | ICD-10-CM

## 2022-08-15 DIAGNOSIS — E340 Carcinoid syndrome: Secondary | ICD-10-CM

## 2022-08-15 DIAGNOSIS — C7A00 Malignant carcinoid tumor of unspecified site: Secondary | ICD-10-CM

## 2022-08-15 DIAGNOSIS — Z0000 Encounter for general adult medical examination without abnormal findings: Secondary | ICD-10-CM | POA: Diagnosis not present

## 2022-08-15 DIAGNOSIS — I10 Essential (primary) hypertension: Secondary | ICD-10-CM

## 2022-08-15 DIAGNOSIS — Z124 Encounter for screening for malignant neoplasm of cervix: Secondary | ICD-10-CM

## 2022-08-15 DIAGNOSIS — Z6833 Body mass index (BMI) 33.0-33.9, adult: Secondary | ICD-10-CM

## 2022-08-15 DIAGNOSIS — E669 Obesity, unspecified: Secondary | ICD-10-CM

## 2022-08-15 DIAGNOSIS — R000 Tachycardia, unspecified: Secondary | ICD-10-CM

## 2022-08-15 MED ORDER — METOPROLOL SUCCINATE ER 100 MG PO TB24: 100.0000 mg | ORAL_TABLET | Freq: Every day | ORAL | 1 refills | Status: DC

## 2022-08-15 NOTE — Assessment & Plan Note (Signed)
Discussed importance of healthy weight management Discussed diet and exercise  

## 2022-08-15 NOTE — Progress Notes (Signed)
I,Sulibeya S Dimas,acting as a Education administrator for Lavon Paganini, MD.,have documented all relevant documentation on the behalf of Lavon Paganini, MD,as directed by  Lavon Paganini, MD while in the presence of Lavon Paganini, MD.    Complete physical exam   Patient: Patricia Glenn   DOB: 27-Jan-1986   37 y.o. Female  MRN: [id number] Visit Date: 08/15/2022  Today's healthcare provider: Lavon Paganini, MD   Chief Complaint  Patient presents with   Annual Exam   Subjective    Patricia Glenn is a 37 y.o. female who presents today for a complete physical exam.  She reports consuming a  general  diet. Gym/ health club routine includes cardio and mod to heavy weightlifting. She generally feels well. She reports sleeping well. She does not have additional problems to discuss today.  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   Dysplastic nevus 07/28/2022   right lateral abdomen, moderate   Encounter for induction of labor 11/17/2017   Encounter for supervision of other normal pregnancy, unspecified trimester 05/23/2017   37 y.o. G3P2002 at 10w1dby Patient's last menstrual period was 03/13/2017. 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, none since pregnancies   Lung mass    Mass of lower lobe of left lung 11/17/2017   Pneumonia complicating pregnancy, second trimester 08/29/2017   Past Surgical History:   Procedure Laterality Date   FLEXIBLE BRONCHOSCOPY N/A 11/29/2017   Procedure: FLEXIBLE BRONCHOSCOPY;  Surgeon: SiWilhelmina McardleMD;  Location: [facility];  Service: Pulmonary;  Laterality: N/A;   VIDEO ASSISTED THORACOSCOPY (VATS)/WEDGE RESECTION Left 12/29/2017   Procedure: Left VIDEO ASSISTED THORACOSCOPY with Mini Thoracotomy, Left Lower Lobe Lobectomy, Node Dissection,  Insertion of  ONQ pain pump;  Surgeon: GeGrace IsaacMD;  [facility];  Service: Thoracic;  Laterality: Left;   VIDEO BRONCHOSCOPY N/A 12/29/2017   Procedure: VIDEO BRONCHOSCOPY;  Surgeon: GeGrace IsaacMD;  [facility];  Service: Thoracic;  Laterality: N/A;   Social History   Socioeconomic History   Marital status: Married    Spouse name: Not on file   Number of children: Not on file   Years of education: Not on file   Highest education level: Not on file  Occupational History   Not on file  Tobacco Use   Smoking status: Never   Smokeless tobacco: Never  Vaping Use   Vaping Use: Never used  Substance and Sexual Activity   Alcohol use: Yes    Comment: occasional   Drug use: No   Sexual activity: Not on file  Other Topics Concern   Not on file  Social History Narrative    stay home mom-; lives at home; 8 miles from here; no smoking; no second smoking; alcohol ocassional; 2 daughters.    Social Determinants of Health   Financial Resource Strain: Not on  file  Food Insecurity: Not on file  Transportation Needs: Not on file  Physical Activity: Not on file  Stress: Not on file  Social Connections: Not on file  Intimate Partner Violence: Not on file   Family Status  Relation Name Status   Mother  Alive   Father  Alive   MGM  (Not Specified)   MGF  Alive   PGM  Deceased   PGF  Deceased   Other  (Not Specified)   Neg Hx  (Not Specified)   Family History  Problem Relation Age of Onset   Prostate cancer Father 39   Kidney disease Father    Diabetes Maternal Grandmother     Parkinson's disease Maternal Grandmother    Heart disease Paternal Grandmother 53       smoker   Bladder Cancer Paternal Grandfather 87   Polycystic kidney disease Paternal Grandfather    Polycystic kidney disease Other    Breast cancer Neg Hx    Ovarian cancer Neg Hx    Colon cancer Neg Hx    No Known Allergies  Patient Care Team: Hashir Deleeuw, Dionne Bucy, MD as PCP - General (Family Medicine) Telford Nab, RN as Registered Nurse Leslie Andrea, MD as Referring Physician (Internal Medicine)   Medications: Outpatient Medications Prior to Visit  Medication Sig   Budeson-Glycopyrrol-Formoterol (BREZTRI AEROSPHERE) 160-9-4.8 MCG/ACT AERO Inhale 2 puffs into the lungs 2 (two) times daily.   levonorgestrel (LILETTA, 52 MG,) 20.1 MCG/DAY IUD by Intrauterine route.   Multiple Vitamin (MULTI-VITAMIN DAILY PO) Take 1 tablet by mouth daily.   Spacer/Aero-Holding Chambers [facility] DIAMOND) MISC Use with inhaler   [DISCONTINUED] hydrochlorothiazide (HYDRODIURIL) 25 MG tablet Take 1 tablet (25 mg total) by mouth daily.   [DISCONTINUED] metoprolol succinate (TOPROL-XL) 50 MG 24 hr tablet Take 1 tablet (50 mg total) by mouth daily. Take with or immediately following a meal.   [DISCONTINUED] albuterol (VENTOLIN HFA) 108 (90 Base) MCG/ACT inhaler Inhale into the lungs.   [DISCONTINUED] benzonatate (TESSALON) 200 MG capsule Take by mouth. (Patient not taking: Reported on 08/15/2022)   [DISCONTINUED] fluticasone (FLONASE) 50 MCG/ACT nasal spray Place 2 sprays into both nostrils daily. (Patient not taking: Reported on 08/15/2022)   [DISCONTINUED] montelukast (SINGULAIR) 10 MG tablet Take 1 tablet (10 mg total) by mouth at bedtime. (Patient not taking: Reported on 08/15/2022)   [DISCONTINUED] predniSONE (DELTASONE) 10 MG tablet Day 1 take 6 tablets Day 2 take 6 tablets Day 3 take 5 tablets Day 4 take 5 tablets Day 5 take 4 tablets Day 6 take 4 tablets Day 7 take 3 tablets Day 8 take 3 tablets Day 9 take 2  tablets Day 10 take 2 tablets Day 11 take 1 tablet Day 12 take 1 tablet Day 13 take 1/2 tablet Day 14 take 1/2 tablet (Patient not taking: Reported on 08/15/2022)   No facility-administered medications prior to visit.    Review of Systems  All other systems reviewed and are negative.     Objective    BP 134/87 (BP Location: Left Arm, Patient Position: Sitting, Cuff Size: Large)   Pulse 91   Temp 97.8 F (36.6 C) (Temporal)   Resp 16   Ht '5\' 8"'$  (1.727 m)   Wt 223 lb 8 oz (101.4 kg)   BMI 33.98 kg/m     Physical Exam Vitals reviewed.  Constitutional:      General: She is not in acute distress.    Appearance: Normal appearance. She is well-developed. She is not diaphoretic.  HENT:     Head: Normocephalic and atraumatic.     Right Ear: Tympanic membrane, ear canal and external ear normal.     Left Ear: Tympanic membrane, ear canal and external ear normal.     Nose: Nose normal.     Mouth/Throat:     Mouth: Mucous membranes are moist.     Pharynx: Oropharynx is clear. No oropharyngeal exudate.  Eyes:     General: No scleral icterus.    Conjunctiva/sclera: Conjunctivae normal.     Pupils: Pupils are equal, round, and reactive to light.  Neck:     Thyroid: No thyromegaly.  Cardiovascular:     Rate and Rhythm: Normal rate and regular rhythm.     Pulses: Normal pulses.     Heart sounds: Normal heart sounds. No murmur heard. Pulmonary:     Effort: Pulmonary effort is normal. No respiratory distress.     Breath sounds: Normal breath sounds. No wheezing or rales.  Abdominal:     General: There is no distension.     Palpations: Abdomen is soft.     Tenderness: There is no abdominal tenderness.  Genitourinary:    Comments: GYN:  External genitalia within normal limits.  Vaginal mucosa pink, moist, normal rugae.  Nonfriable cervix without lesions, no discharge or bleeding noted on speculum exam.   Musculoskeletal:        General: No deformity.     Cervical back: Neck supple.      Right lower leg: No edema.     Left lower leg: No edema.  Lymphadenopathy:     Cervical: No cervical adenopathy.  Skin:    General: Skin is warm and dry.     Findings: No rash.  Neurological:     Mental Status: She is alert and oriented to person, place, and time. Mental status is at baseline.     Gait: Gait normal.  Psychiatric:        Mood and Affect: Mood normal.        Behavior: Behavior normal.        Thought Content: Thought content normal.      Last depression screening scores    08/15/2022    8:34 AM 05/28/2021    8:25 AM 03/19/2020   11:05 AM  PHQ 2/9 Scores  PHQ - 2 Score 0 0 0  PHQ- 9 Score 0  0   Last fall risk screening    08/15/2022    8:34 AM  Fall Risk   Falls in the past year? 0  Number falls in past yr: 0  Injury with Fall? 0  Risk for fall due to : No Fall Risks  Follow up Falls evaluation completed   Last Audit-C alcohol use screening    08/15/2022    8:34 AM  Alcohol Use Disorder Test (AUDIT)  1. How often do you have a drink containing alcohol? 2  2. How many drinks containing alcohol do you have on a typical day when you are drinking? 0  3. How often do you have six or more drinks on one occasion? 0  AUDIT-C Score 2   A score of 3 or more in women, and 4 or more in men indicates increased risk for alcohol abuse, EXCEPT if all of the points are from question 1   No results found for any visits on 08/15/22.  Assessment & Plan    Routine Health Maintenance and Physical Exam  Exercise Activities and Dietary recommendations  Goals  None     Immunization History  Administered Date(s) Administered   Influenza Inj Mdck Quad With Preservative 06/21/2017   Influenza, Quadrivalent, Recombinant, Inj, Pf 05/01/2018   Influenza,inj,Quad PF,6+ Mos 03/20/2019, 03/19/2020, 05/28/2021, 08/15/2022   Moderna Covid-19 Vaccine Bivalent Booster 1yr & up 11/13/2020   Moderna Sars-Covid-2 Vaccination 09/06/2019, 10/07/2019, 04/16/2020    PNEUMOCOCCAL CONJUGATE-20 05/28/2021   Tdap 09/02/2014, 10/03/2017    Health Maintenance  Topic Date Due   COVID-19 Vaccine (5 - 2023-24 season) 02/18/2022   PAP SMEAR-Modifier  05/25/2022   DTaP/Tdap/Td (3 - Td or Tdap) 10/04/2027   INFLUENZA VACCINE  Completed   Hepatitis C Screening  Completed   HIV Screening  Completed   HPV VACCINES  Aged Out    Discussed health benefits of physical activity, and encouraged her to engage in regular exercise appropriate for her age and condition.  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)    Chronic and stable F/b Onc      Relevant Orders   Lipid panel   Comprehensive metabolic panel   CBC w/Diff/Platelet     Other   Obesity    Discussed importance of healthy weight management Discussed diet and exercise       Relevant Orders   Lipid panel   Comprehensive metabolic panel   CBC w/Diff/Platelet   Sinus tachycardia    Increase metop as above      Other Visit Diagnoses     Encounter for annual physical exam    -  Primary   Relevant Orders   Lipid panel   Comprehensive metabolic panel   CBC w/Diff/Platelet   Need for influenza vaccination       Relevant Orders   Flu Vaccine QUAD 624moM (Fluarix, Fluzone & Alfiuria Quad PF) (Completed)   Cervical cancer screening       Relevant Orders   Cytology - PAP        Return in about 6 months (around 02/13/2023)  for chronic disease f/u.     I, AnLavon PaganiniMD, have reviewed all documentation for this visit. The documentation on 08/15/22 for the exam, diagnosis, procedures, and orders are all accurate and complete.   Shriya Aker, AnDionne BucyMD, MPH [facility]

## 2022-08-15 NOTE — Assessment & Plan Note (Signed)
Chronic and stable F/b Onc

## 2022-08-15 NOTE — Assessment & Plan Note (Signed)
Increase metop as above

## 2022-08-15 NOTE — Assessment & Plan Note (Signed)
Chronic and well controlled Having dehydration from hctz - will d/c Increase metoprolol to '100mg'$  dialy Recheck metabolic panel Consider amlodipine if additional is needed

## 2022-08-16 ENCOUNTER — Other Ambulatory Visit: Payer: Self-pay

## 2022-08-16 DIAGNOSIS — R7989 Other specified abnormal findings of blood chemistry: Secondary | ICD-10-CM

## 2022-08-16 LAB — CYTOLOGY - PAP
Comment: NEGATIVE
Diagnosis: NEGATIVE
High risk HPV: NEGATIVE

## 2022-08-16 LAB — COMPREHENSIVE METABOLIC PANEL
ALT: 26 IU/L (ref 0–32)
AST: 21 IU/L (ref 0–40)
Albumin/Globulin Ratio: 1.8 (ref 1.2–2.2)
Albumin: 4.6 g/dL (ref 3.9–4.9)
Alkaline Phosphatase: 87 IU/L (ref 44–121)
BUN/Creatinine Ratio: 14 (ref 9–23)
BUN: 13 mg/dL (ref 6–20)
Bilirubin Total: 0.4 mg/dL (ref 0.0–1.2)
CO2: 24 mmol/L (ref 20–29)
Calcium: 9.9 mg/dL (ref 8.7–10.2)
Chloride: 99 mmol/L (ref 96–106)
Creatinine, Ser: 0.95 mg/dL (ref 0.57–1.00)
Globulin, Total: 2.5 g/dL (ref 1.5–4.5)
Glucose: 103 mg/dL — ABNORMAL HIGH (ref 70–99)
Potassium: 3.6 mmol/L (ref 3.5–5.2)
Sodium: 140 mmol/L (ref 134–144)
Total Protein: 7.1 g/dL (ref 6.0–8.5)
eGFR: 80 mL/min/{1.73_m2} (ref >59)

## 2022-08-16 LAB — CBC WITH DIFFERENTIAL/PLATELET
Basophils Absolute: 0.1 10*3/uL (ref 0.0–0.2)
Basos: 1 %
EOS (ABSOLUTE): 0.2 10*3/uL (ref 0.0–0.4)
Eos: 2 %
Hematocrit: 44.1 % (ref 34.0–46.6)
Hemoglobin: 14.8 g/dL (ref 11.1–15.9)
Immature Grans (Abs): 0 10*3/uL (ref 0.0–0.1)
Immature Granulocytes: 0 %
Lymphocytes Absolute: 2.5 10*3/uL (ref 0.7–3.1)
Lymphs: 22 %
MCH: 28.4 pg (ref 26.6–33.0)
MCHC: 33.6 g/dL (ref 31.5–35.7)
MCV: 85 fL (ref 79–97)
Monocytes Absolute: 0.6 10*3/uL (ref 0.1–0.9)
Monocytes: 6 %
Neutrophils Absolute: 7.9 10*3/uL — ABNORMAL HIGH (ref 1.4–7.0)
Neutrophils: 69 %
Platelets: 314 10*3/uL (ref 150–450)
RBC: 5.21 x10E6/uL (ref 3.77–5.28)
RDW: 13.4 % (ref 11.7–15.4)
WBC: 11.4 10*3/uL — ABNORMAL HIGH (ref 3.4–10.8)

## 2022-08-16 LAB — LIPID PANEL
Chol/HDL Ratio: 6.2 ratio — ABNORMAL HIGH (ref 0.0–4.4)
Cholesterol, Total: 168 mg/dL (ref 100–199)
HDL: 27 mg/dL — ABNORMAL LOW (ref >39)
LDL Chol Calc (NIH): 84 mg/dL (ref 0–99)
Triglycerides: 347 mg/dL — ABNORMAL HIGH (ref 0–149)
VLDL Cholesterol Cal: 57 mg/dL — ABNORMAL HIGH (ref 5–40)

## 2022-08-16 NOTE — Progress Notes (Signed)
Normal/stable labs. White blood cells are slightly elevated. Any recent illness can cause this.  Recheck CBC with diff in 1 month.

## 2022-08-31 NOTE — Telephone Encounter (Signed)
done

## 2022-10-21 ENCOUNTER — Other Ambulatory Visit: Payer: Self-pay

## 2022-10-21 DIAGNOSIS — R7989 Other specified abnormal findings of blood chemistry: Secondary | ICD-10-CM

## 2022-10-22 LAB — CBC WITH DIFFERENTIAL/PLATELET
Basophils Absolute: 0 10*3/uL (ref 0.0–0.2)
Basos: 1 %
EOS (ABSOLUTE): 0.2 10*3/uL (ref 0.0–0.4)
Eos: 2 %
Hematocrit: 41.7 % (ref 34.0–46.6)
Hemoglobin: 13.9 g/dL (ref 11.1–15.9)
Immature Grans (Abs): 0 10*3/uL (ref 0.0–0.1)
Immature Granulocytes: 0 %
Lymphocytes Absolute: 2.5 10*3/uL (ref 0.7–3.1)
Lymphs: 36 %
MCH: 28.7 pg (ref 26.6–33.0)
MCHC: 33.3 g/dL (ref 31.5–35.7)
MCV: 86 fL (ref 79–97)
Monocytes Absolute: 0.4 10*3/uL (ref 0.1–0.9)
Monocytes: 7 %
Neutrophils Absolute: 3.7 10*3/uL (ref 1.4–7.0)
Neutrophils: 54 %
Platelets: 281 10*3/uL (ref 150–450)
RBC: 4.85 x10E6/uL (ref 3.77–5.28)
RDW: 13.3 % (ref 11.7–15.4)
WBC: 6.8 10*3/uL (ref 3.4–10.8)

## 2022-10-26 NOTE — Progress Notes (Signed)
Stable cell count; WBC and neutrophils are improved from 08/15/22.

## 2023-01-16 ENCOUNTER — Encounter: Payer: Self-pay | Admitting: Family Medicine

## 2023-01-16 DIAGNOSIS — L659 Nonscarring hair loss, unspecified: Secondary | ICD-10-CM

## 2023-01-16 DIAGNOSIS — E340 Carcinoid syndrome: Secondary | ICD-10-CM

## 2023-01-16 DIAGNOSIS — R232 Flushing: Secondary | ICD-10-CM

## 2023-01-26 ENCOUNTER — Ambulatory Visit: Payer: Managed Care, Other (non HMO) | Admitting: Dermatology

## 2023-01-31 ENCOUNTER — Ambulatory Visit: Payer: Managed Care, Other (non HMO) | Admitting: Dermatology

## 2023-02-09 NOTE — Telephone Encounter (Signed)
Referral placed.

## 2023-02-22 ENCOUNTER — Other Ambulatory Visit: Payer: Self-pay | Admitting: Family Medicine

## 2023-02-22 NOTE — Telephone Encounter (Unsigned)
Copied from CRM [id number]. Topic: General - Other >> Feb 22, 2023  3:36 PM Everette C wrote: Reason for CRM: Medication Refill - Medication: metoprolol succinate (TOPROL-XL) 100 MG 24 hr tablet [[id number]]  Has the patient contacted their pharmacy? Yes.   (Agent: If no, request that the patient contact the pharmacy for the refill. If patient does not wish to contact the pharmacy document the reason why and proceed with request.) (Agent: If yes, when and what did the pharmacy advise?)  Preferred Pharmacy (with phone number or street name): [facility] [address] [place] - [address] [place] [address] Phone: [phone] Fax: [phone] Hours: Not open 24 hours   Has the patient been seen for an appointment in the last year OR does the patient have an upcoming appointment? Yes.    Agent: Please be advised that RX refills may take up to 3 business days. We ask that you follow-up with your pharmacy.

## 2023-02-23 MED ORDER — METOPROLOL SUCCINATE ER 100 MG PO TB24: 100.0000 mg | ORAL_TABLET | Freq: Every day | ORAL | 0 refills | Status: DC

## 2023-02-23 NOTE — Telephone Encounter (Signed)
Requested Prescriptions  Pending Prescriptions Disp Refills   metoprolol succinate (TOPROL-XL) 100 MG 24 hr tablet 90 tablet 0    Sig: Take 1 tablet (100 mg total) by mouth daily. Take with or immediately following a meal.     Cardiovascular:  Beta Blockers Failed - 02/22/2023  4:20 PM      Failed - Valid encounter within last 6 months    Recent Outpatient Visits           6 months ago Encounter for annual physical exam   [place] [facility] [place], Marzella Schlein, MD   1 year ago Community acquired pneumonia of left lung, unspecified part of lung   [facility] Jacky Kindle, FNP   1 year ago Primary hypertension   [place] [facility] [place], Marzella Schlein, MD   1 year ago Primary hypertension   [place] [facility] [place], Marzella Schlein, MD   1 year ago Encounter for annual physical exam   [place] [facility] [place], Marzella Schlein, MD       Future Appointments             In 4 weeks [place], Marzella Schlein, MD [facility]            Passed - Last BP in normal range    BP Readings from Last 1 Encounters:  08/15/22 134/87         Passed - Last Heart Rate in normal range    Pulse Readings from Last 1 Encounters:  08/15/22 91

## 2023-03-13 ENCOUNTER — Ambulatory Visit: Payer: Managed Care, Other (non HMO) | Admitting: Family Medicine

## 2023-03-23 ENCOUNTER — Ambulatory Visit: Payer: Managed Care, Other (non HMO) | Admitting: Family Medicine

## 2023-03-30 ENCOUNTER — Ambulatory Visit: Payer: Managed Care, Other (non HMO) | Admitting: Family Medicine

## 2023-03-30 ENCOUNTER — Ambulatory Visit (INDEPENDENT_AMBULATORY_CARE_PROVIDER_SITE_OTHER): Payer: Managed Care, Other (non HMO) | Admitting: Family Medicine

## 2023-03-30 VITALS — BP 110/74 | HR 95 | Temp 99.0°F | Resp 16 | Ht 68.0 in | Wt 216.8 lb

## 2023-03-30 DIAGNOSIS — R002 Palpitations: Secondary | ICD-10-CM

## 2023-03-30 DIAGNOSIS — Z23 Encounter for immunization: Secondary | ICD-10-CM

## 2023-03-30 DIAGNOSIS — E66811 Obesity, class 1: Secondary | ICD-10-CM | POA: Diagnosis not present

## 2023-03-30 DIAGNOSIS — I10 Essential (primary) hypertension: Secondary | ICD-10-CM | POA: Diagnosis not present

## 2023-03-30 DIAGNOSIS — C7A8 Other malignant neuroendocrine tumors: Secondary | ICD-10-CM | POA: Diagnosis not present

## 2023-03-30 DIAGNOSIS — Z6832 Body mass index (BMI) 32.0-32.9, adult: Secondary | ICD-10-CM

## 2023-03-30 NOTE — Progress Notes (Signed)
Established Patient Office Visit  Subjective   Patient ID: Patricia Glenn, female    DOB: [birth year]  Age: 37 y.o. MRN: [id number]  Chief Complaint  Patient presents with   Hypertension    Hypertension    Discussed the use of AI scribe software for clinical note transcription with the patient, who gave verbal consent to proceed.  History of Present Illness   The patient, with a history of hypertension, reports normal blood pressure readings at home, with occasional spikes during doctor's visits, possibly due to anxiety. She is currently on Metoprolol, which has effectively managed her palpitations.  The patient has been experiencing gastrointestinal issues, specifically bloating and diarrhea, particularly after consuming red meat. She recalls a severe episode after eating at a [employer] steakhouse, which resulted in extreme discomfort and diarrhea. She has since avoided red meat.  The patient is currently undergoing a series of tests for potential autoimmune conditions and food allergies, as she has been experiencing unexplained health issues. She also mentions a history of a severe tick bite.  In addition to her health concerns, the patient is currently studying nursing, which has been both rewarding and stressful. She expresses a desire to work in a cancer center in the future.         ROS    Objective:     BP 110/74 Comment: home reading  Pulse 95   Temp 99 F (37.2 C)   Resp 16   Ht 5\' 8"  (1.727 m)   Wt 216 lb 12.8 oz (98.3 kg)   SpO2 100%   BMI 32.96 kg/m    Physical Exam Vitals reviewed.  Constitutional:      General: She is not in acute distress.    Appearance: Normal appearance. She is well-developed. She is not diaphoretic.  HENT:     Head: Normocephalic and atraumatic.  Eyes:     General: No scleral icterus.    Conjunctiva/sclera: Conjunctivae normal.  Neck:     Thyroid: No thyromegaly.  Cardiovascular:     Rate and Rhythm: Normal rate and  regular rhythm.     Heart sounds: Normal heart sounds. No murmur heard. Pulmonary:     Effort: Pulmonary effort is normal. No respiratory distress.     Breath sounds: Normal breath sounds. No wheezing, rhonchi or rales.  Musculoskeletal:     Cervical back: Neck supple.     Right lower leg: No edema.     Left lower leg: No edema.  Lymphadenopathy:     Cervical: No cervical adenopathy.  Skin:    General: Skin is warm and dry.     Findings: No rash.  Neurological:     Mental Status: She is alert and oriented to person, place, and time. Mental status is at baseline.  Psychiatric:        Mood and Affect: Mood normal.        Behavior: Behavior normal.      No results found for any visits on 03/30/23.    The ASCVD Risk score (Arnett DK, et al., 2019) failed to calculate for the following reasons:   The 2019 ASCVD risk score is only valid for ages 12 to 24    Assessment & Plan:   Problem List Items Addressed This Visit       Cardiovascular and Mediastinum   Hypertension - Primary    Well controlled at home with systolic readings around 110 and diastolic in the 60s-70s. Office readings consistently higher, likely due to  white coat syndrome. -Continue Metoprolol 100mg  daily.        Respiratory   Primary malignant neuroendocrine neoplasm of lung (HCC)    F/b Oncology        Other   Obesity   Palpitations    Resolved since starting Metoprolol. No recurrence of symptoms reported. -Continue Metoprolol 100mg  daily.      Other Visit Diagnoses     Need for influenza vaccination       Relevant Orders   Flu vaccine trivalent PF, 6mos and older(Flulaval,Afluria,Fluarix,Fluzone) (Completed)          General Health Maintenance -Continue current management and follow-up in 6 months for a physical, or sooner if any issues arise.        Return in about 6 months (around 09/28/2023) for CPE.    Shirlee Latch, MD

## 2023-03-30 NOTE — Progress Notes (Deleted)
Established Patient Office Visit  Subjective   Patient ID: Patricia Glenn, female    DOB: May 27, 1986  Age: 37 y.o. MRN: [id number]  No chief complaint on file.   HPI  Discussed the use of AI scribe software for clinical note transcription with the patient, who gave verbal consent to proceed.  History of Present Illness           {History (Optional):23778}  ROS    Objective:     There were no vitals taken for this visit. {Vitals History (Optional):23777}  Physical Exam   No results found for any visits on 03/30/23.  {Labs (Optional):23779}  The ASCVD Risk score (Arnett DK, et al., 2019) failed to calculate for the following reasons:   The 2019 ASCVD risk score is only valid for ages 54 to 37    Assessment & Plan:   Problem List Items Addressed This Visit   None               No follow-ups on file.    Shirlee Latch, MD

## 2023-03-30 NOTE — Assessment & Plan Note (Signed)
Well controlled at home with systolic readings around 110 and diastolic in the 60s-70s. Office readings consistently higher, likely due to white coat syndrome. -Continue Metoprolol 100mg  daily.

## 2023-03-30 NOTE — Assessment & Plan Note (Signed)
Resolved since starting Metoprolol. No recurrence of symptoms reported. -Continue Metoprolol 100mg  daily.

## 2023-03-30 NOTE — Assessment & Plan Note (Signed)
F/b Oncology

## 2023-04-13 ENCOUNTER — Encounter: Payer: Self-pay | Admitting: Family Medicine

## 2023-04-24 ENCOUNTER — Ambulatory Visit (INDEPENDENT_AMBULATORY_CARE_PROVIDER_SITE_OTHER): Payer: Managed Care, Other (non HMO) | Admitting: Family Medicine

## 2023-04-24 VITALS — BP 120/80 | HR 85 | Ht 68.0 in | Wt 216.0 lb

## 2023-04-24 DIAGNOSIS — H66001 Acute suppurative otitis media without spontaneous rupture of ear drum, right ear: Secondary | ICD-10-CM

## 2023-04-24 DIAGNOSIS — J157 Pneumonia due to Mycoplasma pneumoniae: Secondary | ICD-10-CM | POA: Diagnosis not present

## 2023-04-24 MED ORDER — AZITHROMYCIN 250 MG PO TABS: ORAL_TABLET | ORAL | 0 refills | Status: DC

## 2023-04-24 NOTE — Progress Notes (Signed)
Acute Office Visit  Subjective:     Patient ID: Patricia Glenn, female    DOB: 23-Sep-1985, 37 y.o.   MRN: [id number]  Chief Complaint  Patient presents with   congested    Chest congested, right ear unable to hear--over 1 week. Tried OTC temporary relief.     HPI Discussed the use of AI scribe software for clinical note transcription with the patient, who gave verbal consent to proceed.  History of Present Illness   The patient, with a history of cancer, presents with symptoms of chest congestion, coughing, and ear blockage. They report that the symptoms began after a recent trip to [facility], where they believe they contracted the illness. The patient's children were sick a few weeks prior, but have since recovered. The patient has been managing symptoms with Dayquil, taken every six hours. The patient describes the cough as wet and reports feeling a rattling sensation in their chest. Despite the congestion and coughing, the patient reports no production of sputum. The patient also reports a blockage in the right ear, which has resulted in hearing loss. However, they report no pain associated with the ear blockage. The patient's throat is sore and they are losing their voice. The patient also mentions ongoing communication with their oncologist regarding abnormal lab results.       ROS      Objective:    BP (!) 159/101   Pulse 85   Ht 5\' 8"  (1.727 m)   Wt 216 lb (98 kg)   SpO2 98%   BMI 32.84 kg/m    Physical Exam Vitals reviewed.  Constitutional:      General: She is not in acute distress.    Appearance: Normal appearance. She is well-developed. She is not diaphoretic.  HENT:     Head: Normocephalic and atraumatic.     Right Ear: A middle ear effusion is present. Tympanic membrane is erythematous and bulging.     Left Ear: Tympanic membrane, ear canal and external ear normal.  Eyes:     General: No scleral icterus.    Conjunctiva/sclera: Conjunctivae normal.   Neck:     Thyroid: No thyromegaly.  Cardiovascular:     Rate and Rhythm: Normal rate and regular rhythm.     Heart sounds: Normal heart sounds. No murmur heard. Pulmonary:     Effort: Pulmonary effort is normal. No respiratory distress.     Breath sounds: Wheezing and rales present.  Musculoskeletal:     Cervical back: Neck supple.     Right lower leg: No edema.     Left lower leg: No edema.  Lymphadenopathy:     Cervical: No cervical adenopathy.  Skin:    General: Skin is warm and dry.     Findings: No rash.  Neurological:     Mental Status: She is alert and oriented to person, place, and time. Mental status is at baseline.  Psychiatric:        Mood and Affect: Mood normal.        Behavior: Behavior normal.     No results found for any visits on 04/24/23.      Assessment & Plan:   Problem List Items Addressed This Visit   None Visit Diagnoses     Pneumonia of both lower lobes due to Mycoplasma pneumoniae    -  Primary   Relevant Medications   azithromycin (ZITHROMAX) 250 MG tablet   Non-recurrent acute suppurative otitis media of right ear without spontaneous rupture  of tympanic membrane       Relevant Medications   azithromycin (ZITHROMAX) 250 MG tablet           Walking Pneumonia (Mycoplasma Pneumonia) Presents with chest congestion, wet cough, and crackles. Likely contracted from children. Walking pneumonia is mild but persistent, affecting both lungs. Antibiotics recommended to expedite recovery and reduce discomfort. - Prescribe azithromycin (Z-Pak): 2 pills on the first day, then 1 pill daily for the next 4 days - Advise to continue Dayquil/OTC meds as needed - Instruct to monitor for fever and seek medical attention if symptoms worsen  Otitis Media Reports right ear hearing loss without pain. Examination reveals a red ear with fluid behind the eardrum, indicating infection. The same antibiotic prescribed for walking pneumonia will treat the ear  infection. - Prescribe azithromycin (Z-Pak): 2 pills on the first day, then 1 pill daily for the next 4 days  Abnormal ANA Test Reports abnormal lab results from oncologist, including a positive ANA test. ANA screens for lupus but has a high false positive rate. Advised to wait for further confirmation from oncologist. Reassured no immediate concern. - Advise to wait for further confirmation from oncologist regarding ANA test results - Reassure no immediate concern  Hypertension Blood pressure noted to be high in clinic, but reports normal readings at home. Elevated readings attributed to situational stress. Blood pressure well-controlled at home. - Monitor blood pressure regularly at home - Advise to manage stress and monitor for any changes in blood pressure  Follow-up - Follow up with oncologist regarding lab results - Advise to continue monitoring symptoms and report any significant changes.        Meds ordered this encounter  Medications   azithromycin (ZITHROMAX) 250 MG tablet    Sig: Take 500mg  PO daily x1d and then 250mg  daily x4 days    Dispense:  6 each    Refill:  0    Return if symptoms worsen or fail to improve.  Shirlee Latch, MD

## 2023-04-27 ENCOUNTER — Encounter: Payer: Self-pay | Admitting: Family Medicine

## 2023-04-28 NOTE — Telephone Encounter (Signed)
Ok to order CMP, CBC, and iron panel for iron deficiency and elevated LFTs. We can order them now and patient can come in in 2-3 months to have them drawn. Thanks!

## 2023-06-04 ENCOUNTER — Encounter: Payer: Self-pay | Admitting: Family Medicine

## 2023-06-06 MED ORDER — METOPROLOL SUCCINATE ER 100 MG PO TB24: 100.0000 mg | ORAL_TABLET | Freq: Every day | ORAL | 0 refills | Status: DC

## 2023-06-28 ENCOUNTER — Encounter: Payer: Self-pay | Admitting: Family Medicine

## 2023-07-20 ENCOUNTER — Other Ambulatory Visit: Payer: Self-pay

## 2023-07-20 DIAGNOSIS — E611 Iron deficiency: Secondary | ICD-10-CM

## 2023-07-20 DIAGNOSIS — R7989 Other specified abnormal findings of blood chemistry: Secondary | ICD-10-CM

## 2023-07-21 ENCOUNTER — Encounter: Payer: Self-pay | Admitting: Family Medicine

## 2023-07-21 DIAGNOSIS — R253 Fasciculation: Secondary | ICD-10-CM

## 2023-07-21 LAB — CBC WITH DIFFERENTIAL/PLATELET
Basophils Absolute: 0.1 10*3/uL (ref 0.0–0.2)
Basos: 1 %
EOS (ABSOLUTE): 0.2 10*3/uL (ref 0.0–0.4)
Eos: 2 %
Hematocrit: 42 % (ref 34.0–46.6)
Hemoglobin: 13.9 g/dL (ref 11.1–15.9)
Immature Grans (Abs): 0 10*3/uL (ref 0.0–0.1)
Immature Granulocytes: 0 %
Lymphocytes Absolute: 2.4 10*3/uL (ref 0.7–3.1)
Lymphs: 32 %
MCH: 28.5 pg (ref 26.6–33.0)
MCHC: 33.1 g/dL (ref 31.5–35.7)
MCV: 86 fL (ref 79–97)
Monocytes Absolute: 0.4 10*3/uL (ref 0.1–0.9)
Monocytes: 6 %
Neutrophils Absolute: 4.5 10*3/uL (ref 1.4–7.0)
Neutrophils: 59 %
Platelets: 274 10*3/uL (ref 150–450)
RBC: 4.87 x10E6/uL (ref 3.77–5.28)
RDW: 13.6 % (ref 11.7–15.4)
WBC: 7.6 10*3/uL (ref 3.4–10.8)

## 2023-07-21 LAB — IRON,TIBC AND FERRITIN PANEL
Ferritin: 98 ng/mL (ref 15–150)
Iron Saturation: 44 % (ref 15–55)
Iron: 122 ug/dL (ref 27–159)
Total Iron Binding Capacity: 279 ug/dL (ref 250–450)
UIBC: 157 ug/dL (ref 131–425)

## 2023-07-21 LAB — COMPREHENSIVE METABOLIC PANEL
ALT: 26 [IU]/L (ref 0–32)
AST: 22 [IU]/L (ref 0–40)
Albumin: 4.3 g/dL (ref 3.9–4.9)
Alkaline Phosphatase: 74 [IU]/L (ref 44–121)
BUN/Creatinine Ratio: 13 (ref 9–23)
BUN: 12 mg/dL (ref 6–20)
Bilirubin Total: 0.4 mg/dL (ref 0.0–1.2)
CO2: 22 mmol/L (ref 20–29)
Calcium: 9.3 mg/dL (ref 8.7–10.2)
Chloride: 104 mmol/L (ref 96–106)
Creatinine, Ser: 0.89 mg/dL (ref 0.57–1.00)
Globulin, Total: 2.3 g/dL (ref 1.5–4.5)
Glucose: 87 mg/dL (ref 70–99)
Potassium: 4.4 mmol/L (ref 3.5–5.2)
Sodium: 142 mmol/L (ref 134–144)
Total Protein: 6.6 g/dL (ref 6.0–8.5)
eGFR: 86 mL/min/{1.73_m2} (ref >59)

## 2023-07-21 NOTE — Telephone Encounter (Signed)
Ok to place referral to neurology - saw [facility] before.

## 2023-08-02 ENCOUNTER — Encounter: Payer: Managed Care, Other (non HMO) | Admitting: Dermatology

## 2023-08-03 ENCOUNTER — Encounter: Payer: Managed Care, Other (non HMO) | Admitting: Dermatology

## 2023-08-30 ENCOUNTER — Other Ambulatory Visit: Payer: Self-pay | Admitting: Family Medicine

## 2023-08-30 MED ORDER — METOPROLOL SUCCINATE ER 100 MG PO TB24: 100.0000 mg | ORAL_TABLET | Freq: Every day | ORAL | 0 refills | Status: DC

## 2023-09-15 ENCOUNTER — Other Ambulatory Visit: Payer: Self-pay | Admitting: Neurology

## 2023-09-15 DIAGNOSIS — R253 Fasciculation: Secondary | ICD-10-CM

## 2023-09-15 DIAGNOSIS — M5481 Occipital neuralgia: Secondary | ICD-10-CM

## 2023-09-27 ENCOUNTER — Other Ambulatory Visit: Payer: Self-pay | Admitting: Medical Genetics

## 2023-09-29 ENCOUNTER — Encounter: Payer: Self-pay | Admitting: Family Medicine

## 2023-09-29 ENCOUNTER — Ambulatory Visit (INDEPENDENT_AMBULATORY_CARE_PROVIDER_SITE_OTHER): Payer: Self-pay | Admitting: Family Medicine

## 2023-09-29 VITALS — BP 124/81 | HR 72 | Ht 68.0 in | Wt 221.9 lb

## 2023-09-29 DIAGNOSIS — E66811 Obesity, class 1: Secondary | ICD-10-CM

## 2023-09-29 DIAGNOSIS — I10 Essential (primary) hypertension: Secondary | ICD-10-CM | POA: Diagnosis not present

## 2023-09-29 DIAGNOSIS — Z0000 Encounter for general adult medical examination without abnormal findings: Secondary | ICD-10-CM | POA: Diagnosis not present

## 2023-09-29 DIAGNOSIS — Z6833 Body mass index (BMI) 33.0-33.9, adult: Secondary | ICD-10-CM | POA: Diagnosis not present

## 2023-09-29 NOTE — Assessment & Plan Note (Signed)
 Blood pressure is well-controlled with recent readings of 124/80 mmHg and 117/72 mmHg. Heart rate occasionally increases to 120 bpm during activities such as driving. - Continue metoprolol 100 mg daily. - Advise taking an additional half or quarter dose of metoprolol if heart rate increases significantly.

## 2023-09-29 NOTE — Progress Notes (Signed)
 Complete physical exam   Patient: Patricia Glenn   DOB: 05-23-1986   38 y.o. Female  MRN: [id number] Visit Date: 09/29/2023  Today's healthcare provider: Shirlee Latch, MD   Chief Complaint  Patient presents with   Annual Exam    Last completed 08/15/22 Diet -  general but not too much wheat  Exercise - daily for 45 minutes to 60 minutes or greater depending on activity Feeling - well Sleeping - fairly well due to waking up randomly some nights Concerns -  none   Subjective    ANAE HAMS is a 38 y.o. female who presents today for a complete physical exam.   Discussed the use of AI scribe software for clinical note transcription with the patient, who gave verbal consent to proceed.  History of Present Illness   The patient, with a history of primary neuroendocrine neoplasm of the lung, presents for a routine check-up. She reports that her home blood pressure readings have been satisfactory, with recent readings of 124/80 and 117/72. However, she has noticed an increase in her heart rate, with episodes of it reaching 120 bpm while at rest. She also reports muscle twitching, primarily in the leg, and occasional loss of hand function during workouts, with associated swelling in the knuckles.  The patient also mentions a recent high B6 level of 124, which was discovered during blood work for neurological evaluation. She had taken a multivitamin containing 20mg  of B6 about an hour before the blood draw. She has since stopped taking the multivitamin.  The patient also reports episodes of gastrointestinal distress, particularly after consuming red meat. She describes an episode of passing black, tarry stools after eating at a steakhouse. She has since avoided red meat.  The patient is currently undergoing evaluation for potential autoimmune conditions, including lupus and multiple sclerosis. She reports that her nerve functions are delayed and that she has a rash across her  face that appears during times of stress.        Last depression screening scores    04/24/2023    3:17 PM 03/30/2023    1:06 PM 08/15/2022    8:34 AM  PHQ 2/9 Scores  PHQ - 2 Score 0 0 0  PHQ- 9 Score 3 2 0   Last fall risk screening    03/30/2023    1:07 PM  Fall Risk   Falls in the past year? 0  Injury with Fall? 0        Medications: Outpatient Medications Prior to Visit  Medication Sig   azithromycin (ZITHROMAX) 250 MG tablet Take 500mg  PO daily x1d and then 250mg  daily x4 days   levonorgestrel (LILETTA, 52 MG,) 20.1 MCG/DAY IUD by Intrauterine route.   metoprolol succinate (TOPROL-XL) 100 MG 24 hr tablet Take 1 tablet (100 mg total) by mouth daily. Take with or immediately following a meal.   Multiple Vitamin (MULTI-VITAMIN DAILY PO) Take 1 tablet by mouth daily.   [DISCONTINUED] baclofen (LIORESAL) 10 MG tablet Take 10 mg by mouth 2 (two) times daily as needed. (Patient not taking: Reported on 09/29/2023)   No facility-administered medications prior to visit.    Review of Systems    Objective    BP 124/81 Comment: home reading  Pulse 72   Ht 5\' 8"  (1.727 m)   Wt 221 lb 14.4 oz (100.7 kg)   SpO2 100%   BMI 33.74 kg/m    Physical Exam Vitals reviewed.  Constitutional:  General: She is not in acute distress.    Appearance: Normal appearance. She is well-developed. She is not diaphoretic.  HENT:     Head: Normocephalic and atraumatic.     Right Ear: Tympanic membrane, ear canal and external ear normal.     Left Ear: Tympanic membrane, ear canal and external ear normal.     Nose: Nose normal.     Mouth/Throat:     Mouth: Mucous membranes are moist.     Pharynx: Oropharynx is clear. No oropharyngeal exudate.  Eyes:     General: No scleral icterus.    Conjunctiva/sclera: Conjunctivae normal.     Pupils: Pupils are equal, round, and reactive to light.  Neck:     Thyroid: No thyromegaly.  Cardiovascular:     Rate and Rhythm: Normal rate and  regular rhythm.     Heart sounds: Normal heart sounds. No murmur heard. Pulmonary:     Effort: Pulmonary effort is normal. No respiratory distress.     Breath sounds: Normal breath sounds. No wheezing or rales.  Abdominal:     General: There is no distension.     Palpations: Abdomen is soft.     Tenderness: There is no abdominal tenderness.  Musculoskeletal:        General: No deformity.     Cervical back: Neck supple.     Right lower leg: No edema.     Left lower leg: No edema.  Lymphadenopathy:     Cervical: No cervical adenopathy.  Skin:    General: Skin is warm and dry.     Findings: No rash.  Neurological:     Mental Status: She is alert and oriented to person, place, and time. Mental status is at baseline.     Gait: Gait normal.  Psychiatric:        Mood and Affect: Mood normal.        Behavior: Behavior normal.        Thought Content: Thought content normal.      No results found for any visits on 09/29/23.  Assessment & Plan    Routine Health Maintenance and Physical Exam  Exercise Activities and Dietary recommendations  Goals   None     Immunization History  Administered Date(s) Administered   Influenza Inj Mdck Quad With Preservative 06/21/2017   Influenza, Quadrivalent, Recombinant, Inj, Pf 05/01/2018   Influenza, Seasonal, Injecte, Preservative Fre 03/30/2023   Influenza,inj,Quad PF,6+ Mos 03/20/2019, 03/19/2020, 05/28/2021, 08/15/2022   Moderna Covid-19 Vaccine Bivalent Booster 46yrs & up 11/13/2020   Moderna Sars-Covid-2 Vaccination 09/06/2019, 10/07/2019, 04/16/2020   PNEUMOCOCCAL CONJUGATE-20 05/28/2021   Tdap 09/02/2014, 10/03/2017    Health Maintenance  Topic Date Due   COVID-19 Vaccine (5 - 2024-25 season) 02/19/2023   INFLUENZA VACCINE  01/19/2024   Cervical Cancer Screening (HPV/Pap Cotest)  08/16/2027   DTaP/Tdap/Td (3 - Td or Tdap) 10/04/2027   Pneumococcal Vaccine 50-52 Years old  Completed   Hepatitis C Screening  Completed   HIV  Screening  Completed   HPV VACCINES  Aged Out   Meningococcal B Vaccine  Aged Out    Discussed health benefits of physical activity, and encouraged her to engage in regular exercise appropriate for her age and condition.  Problem List Items Addressed This Visit       Cardiovascular and Mediastinum   Hypertension   Blood pressure is well-controlled with recent readings of 124/80 mmHg and 117/72 mmHg. Heart rate occasionally increases to 120 bpm during activities such as  driving. - Continue metoprolol 100 mg daily. - Advise taking an additional half or quarter dose of metoprolol if heart rate increases significantly.      Relevant Orders   Lipid Panel With LDL/HDL Ratio     Other   Obesity   Relevant Orders   Lipid Panel With LDL/HDL Ratio   Other Visit Diagnoses       Encounter for annual physical exam    -  Primary   Relevant Orders   Lipid Panel With LDL/HDL Ratio           Peripheral Neuropathy Elevated vitamin B6 levels may contribute to symptoms such as delayed nerve function in extremities, muscle twitching, and knuckle swelling. Differential diagnosis includes lupus, multiple sclerosis, and other autoimmune conditions. Neurology workup is ongoing, with a brain scan scheduled. - Discontinue multivitamin containing vitamin B6. - Continue neurology workup, including upcoming brain scan.  General Health Maintenance Routine wellness and screening are up to date. Cholesterol screening is due. Other screenings, including Pap smear, mammogram, and colon cancer screening, are scheduled. Immunizations are current. - Order cholesterol screening. - Schedule next Pap smear in 2029. - Schedule mammogram at age 62. - Schedule colon cancer screening at age 7. - Administer flu shot in the fall. - Administer Tdap in 2029.  Follow-up Plans for ongoing management and monitoring of conditions discussed. A letter of medical necessity for an air filtration system was provided due  to lung neoplasm. - Return for follow-up in six months for blood pressure management. - Coordinate with cancer center for comprehensive testing in September. - Administer flu shot in October if not already received. - Provide letter of medical necessity for air filtration system.       Return in about 6 months (around 03/30/2024) for chronic disease f/u.     Shirlee Latch, MD  [facility] [phone] (phone) [phone] (fax)  [facility]

## 2023-09-30 LAB — LIPID PANEL WITH LDL/HDL RATIO
Cholesterol, Total: 149 mg/dL (ref 100–199)
HDL: 28 mg/dL — ABNORMAL LOW (ref >39)
LDL Chol Calc (NIH): 92 mg/dL (ref 0–99)
LDL/HDL Ratio: 3.3 ratio — ABNORMAL HIGH (ref 0.0–3.2)
Triglycerides: 162 mg/dL — ABNORMAL HIGH (ref 0–149)
VLDL Cholesterol Cal: 29 mg/dL (ref 5–40)

## 2023-10-02 ENCOUNTER — Encounter: Payer: Self-pay | Admitting: Family Medicine

## 2023-10-03 ENCOUNTER — Ambulatory Visit
Admission: RE | Admit: 2023-10-03 | Discharge: 2023-10-03 | Disposition: A | Discharge status: Home / Self Care | Source: Ambulatory Visit | Attending: Neurology | Admitting: Neurology

## 2023-10-03 DIAGNOSIS — M5481 Occipital neuralgia: Secondary | ICD-10-CM

## 2023-10-03 DIAGNOSIS — R253 Fasciculation: Secondary | ICD-10-CM

## 2023-10-03 MED ORDER — GADOPICLENOL 0.5 MMOL/ML IV SOLN
10.0000 mL | Freq: Once | INTRAVENOUS | Status: AC | PRN
  Administered 2023-10-03: 10 mL via INTRAVENOUS

## 2023-10-09 ENCOUNTER — Encounter: Payer: Self-pay | Admitting: Family Medicine

## 2023-10-09 DIAGNOSIS — R21 Rash and other nonspecific skin eruption: Secondary | ICD-10-CM

## 2023-10-09 DIAGNOSIS — M2550 Pain in unspecified joint: Secondary | ICD-10-CM

## 2023-10-09 DIAGNOSIS — L659 Nonscarring hair loss, unspecified: Secondary | ICD-10-CM

## 2023-10-10 NOTE — Telephone Encounter (Signed)
 Ok to send Referral to [facility] Rheum for raynauds, hair loss, polyarthralgia, rash

## 2023-12-14 ENCOUNTER — Encounter: Payer: Self-pay | Admitting: Family Medicine

## 2023-12-14 ENCOUNTER — Other Ambulatory Visit: Payer: Self-pay

## 2023-12-14 MED ORDER — METOPROLOL SUCCINATE ER 100 MG PO TB24
100.0000 mg | ORAL_TABLET | Freq: Every day | ORAL | 0 refills | Status: DC
Start: 2023-12-14 — End: 2024-03-13

## 2024-03-13 ENCOUNTER — Encounter: Payer: Self-pay | Admitting: Family Medicine

## 2024-03-13 ENCOUNTER — Other Ambulatory Visit: Payer: Self-pay

## 2024-03-13 MED ORDER — METOPROLOL SUCCINATE ER 100 MG PO TB24: 100.0000 mg | ORAL_TABLET | Freq: Every day | ORAL | 0 refills | Status: DC

## 2024-03-31 ENCOUNTER — Other Ambulatory Visit: Payer: Self-pay | Admitting: Medical Genetics

## 2024-03-31 DIAGNOSIS — Z006 Encounter for examination for normal comparison and control in clinical research program: Secondary | ICD-10-CM

## 2024-04-01 ENCOUNTER — Ambulatory Visit: Admitting: Family Medicine

## 2024-04-01 VITALS — BP 110/70 | HR 84 | Ht 68.0 in | Wt 224.2 lb

## 2024-04-01 DIAGNOSIS — C7A8 Other malignant neuroendocrine tumors: Secondary | ICD-10-CM

## 2024-04-01 DIAGNOSIS — Z6834 Body mass index (BMI) 34.0-34.9, adult: Secondary | ICD-10-CM

## 2024-04-01 DIAGNOSIS — Z23 Encounter for immunization: Secondary | ICD-10-CM | POA: Diagnosis not present

## 2024-04-01 DIAGNOSIS — C7A00 Malignant carcinoid tumor of unspecified site: Secondary | ICD-10-CM

## 2024-04-01 DIAGNOSIS — R253 Fasciculation: Secondary | ICD-10-CM

## 2024-04-01 DIAGNOSIS — E66811 Obesity, class 1: Secondary | ICD-10-CM

## 2024-04-01 DIAGNOSIS — I10 Essential (primary) hypertension: Secondary | ICD-10-CM | POA: Diagnosis not present

## 2024-04-01 DIAGNOSIS — Z09 Encounter for follow-up examination after completed treatment for conditions other than malignant neoplasm: Secondary | ICD-10-CM

## 2024-04-01 DIAGNOSIS — C7B09 Secondary carcinoid tumors of other sites: Secondary | ICD-10-CM

## 2024-04-01 MED ORDER — METOPROLOL SUCCINATE ER 100 MG PO TB24: 100.0000 mg | ORAL_TABLET | Freq: Every day | ORAL | 3 refills | Status: AC

## 2024-04-01 NOTE — Progress Notes (Signed)
 Established patient visit   Patient: Patricia Glenn   DOB: 03-03-1986   38 y.o. Female  MRN: [id number] Visit Date: 04/01/2024  Today's healthcare provider: Jon Eva, MD   Chief Complaint  Patient presents with   Medical Management of Chronic Issues   Hypertension    Patient reports she takes medication as prescribed and monitors her blood pressure at home with a range of 110/70-ish reading. She does not smoke. She consumes a regular diet. She reports no symptoms   Subjective    Hypertension   HPI     Hypertension    Additional comments: Patient reports she takes medication as prescribed and monitors her blood pressure at home with a range of 110/70-ish reading. She does not smoke. She consumes a regular diet. She reports no symptoms      Last edited by Lilian Fitzpatrick, CMA on 04/01/2024  9:08 AM.       Discussed the use of AI scribe software for clinical note transcription with the patient, who gave verbal consent to proceed.  History of Present Illness   Patricia Glenn is a 38 year old female with hypertension who presents for a follow-up on blood pressure management.  Home blood pressure readings are excellent, typically around 110/70 mmHg, with occasional readings reaching 120/80 mmHg. Blood pressure is usually higher in the clinic. She is currently on metoprolol  for management.  She experiences ongoing muscle spasms and shakiness in her fingers, with her entire back spasming when she relaxes. She is undergoing evaluation with neurology for these symptoms and mentions a potential Parkinson's biopsy due to a family history of Parkinson's disease.  She is concerned about upcoming changes in her insurance, which may affect her coverage for specialists.         Medications: Outpatient Medications Prior to Visit  Medication Sig   levonorgestrel (LILETTA, 52 MG,) 20.1 MCG/DAY IUD by Intrauterine route.   Multiple Vitamin (MULTI-VITAMIN DAILY PO)  Take 1 tablet by mouth daily.   [DISCONTINUED] metoprolol  succinate (TOPROL -XL) 100 MG 24 hr tablet Take 1 tablet (100 mg total) by mouth daily. Take with or immediately following a meal.   [DISCONTINUED] azithromycin  (ZITHROMAX ) 250 MG tablet Take 500mg  PO daily x1d and then 250mg  daily x4 days   No facility-administered medications prior to visit.    Review of Systems     Objective    BP 110/70 Comment: home reading  Pulse 84   Ht 5' 8 (1.727 m)   Wt 224 lb 3.2 oz (101.7 kg)   SpO2 100%   BMI 34.09 kg/m    Physical Exam Vitals reviewed.  Constitutional:      General: She is not in acute distress.    Appearance: Normal appearance. She is well-developed. She is not diaphoretic.  HENT:     Head: Normocephalic and atraumatic.  Eyes:     General: No scleral icterus.    Conjunctiva/sclera: Conjunctivae normal.  Neck:     Thyroid: No thyromegaly.  Cardiovascular:     Rate and Rhythm: Normal rate and regular rhythm.     Heart sounds: Normal heart sounds. No murmur heard. Pulmonary:     Effort: Pulmonary effort is normal. No respiratory distress.     Breath sounds: Normal breath sounds. No wheezing, rhonchi or rales.  Musculoskeletal:     Cervical back: Neck supple.     Right lower leg: No edema.     Left lower leg: No edema.  Lymphadenopathy:  Cervical: No cervical adenopathy.  Skin:    General: Skin is warm and dry.     Findings: No rash.  Neurological:     Mental Status: She is alert and oriented to person, place, and time. Mental status is at baseline.  Psychiatric:        Mood and Affect: Mood normal.        Behavior: Behavior normal.      No results found for any visits on 04/01/24.  Assessment & Plan     Problem List Items Addressed This Visit       Cardiovascular and Mediastinum   Hypertension - Primary   Blood pressure readings at home are well-controlled, typically around 110/70 mmHg, with occasional readings of 120/80 mmHg. Blood pressure in  the office is also acceptable, indicating good management of hypertension. - Continue metoprolol  as prescribed. - Monitor blood pressure at home regularly.      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 Neurological evaluation is ongoing, with consideration for a Parkinson's biopsy due to family history of Parkinson's disease and presence of early symptoms. - Proceed with Parkinson's biopsy as planned.  Active oncology surveillance for prior neoplasm Oncology follow-up indicates no growth in atypical cells, which is a positive outcome. Continued surveillance is necessary to monitor for any changes. - Continue active oncology surveillance with Dr. Loreli.      Return in about 6 months (around 09/30/2024) for CPE.       Jon Eva, MD  [facility] [phone] (phone) [phone] (fax)  [facility]

## 2024-04-01 NOTE — Assessment & Plan Note (Signed)
 Blood pressure readings at home are well-controlled, typically around 110/70 mmHg, with occasional readings of 120/80 mmHg. Blood pressure in the office is also acceptable, indicating good management of hypertension. - Continue metoprolol  as prescribed. - Monitor blood pressure at home regularly.

## 2024-04-16 LAB — GENECONNECT MOLECULAR SCREEN: Genetic Analysis Overall Interpretation: NEGATIVE

## 2024-04-18 ENCOUNTER — Ambulatory Visit: Admitting: Dermatology

## 2024-04-18 ENCOUNTER — Encounter: Payer: Self-pay | Admitting: Dermatology

## 2024-04-18 VITALS — BP 164/94 | HR 88

## 2024-04-18 DIAGNOSIS — D1801 Hemangioma of skin and subcutaneous tissue: Secondary | ICD-10-CM

## 2024-04-18 DIAGNOSIS — W908XXA Exposure to other nonionizing radiation, initial encounter: Secondary | ICD-10-CM

## 2024-04-18 DIAGNOSIS — L821 Other seborrheic keratosis: Secondary | ICD-10-CM

## 2024-04-18 DIAGNOSIS — D489 Neoplasm of uncertain behavior, unspecified: Secondary | ICD-10-CM | POA: Diagnosis not present

## 2024-04-18 DIAGNOSIS — Z1283 Encounter for screening for malignant neoplasm of skin: Secondary | ICD-10-CM | POA: Diagnosis not present

## 2024-04-18 DIAGNOSIS — D229 Melanocytic nevi, unspecified: Secondary | ICD-10-CM

## 2024-04-18 DIAGNOSIS — L578 Other skin changes due to chronic exposure to nonionizing radiation: Secondary | ICD-10-CM

## 2024-04-18 DIAGNOSIS — D2272 Melanocytic nevi of left lower limb, including hip: Secondary | ICD-10-CM

## 2024-04-18 DIAGNOSIS — L814 Other melanin hyperpigmentation: Secondary | ICD-10-CM | POA: Diagnosis not present

## 2024-04-18 NOTE — Progress Notes (Signed)
   New Patient Visit   Subjective  Patricia Glenn is a 38 y.o. female who presents for the following:  Total Body Skin Exam (TBSE)  Patient present today for new patient visit for TBSE. The patient reports she has spots, moles and lesions to be evaluated, some may be new or changing and the patient may have concern these could be cancer.  Patient has previously been treated by a dermatologist. Patient reports she has hx of bx with abnormal results (moderate to severe atypia) that have been excised Patient denies family history of skin cancers.  Patient reports throughout her lifetime has had moderate sun exposure.  Currently, patient reports if she has excessive sun exposure, she does apply sunscreen and/or wears protective coverings.  Patient has Carcinoid Syndrome  The following portions of the chart were reviewed this encounter and updated as appropriate: medications, allergies, medical history  Review of Systems:  No other skin or systemic complaints except as noted in HPI or Assessment and Plan.  Objective  Well appearing patient in no apparent distress; mood and affect are within normal limits.  A full examination was performed including scalp, head, eyes, ears, nose, lips, neck, chest, axillae, abdomen, back, buttocks, bilateral upper extremities, bilateral lower extremities, hands, feet, fingers, toes, fingernails, and toenails. All findings within normal limits unless otherwise noted below.     Relevant exam findings are noted in the Assessment and Plan.  Left Lower Leg - Anterior 5mm Irregular brown macule  Assessment & Plan   LENTIGINES, SEBORRHEIC KERATOSES, HEMANGIOMAS - Benign normal skin lesions - Benign-appearing - Call for any changes  MELANOCYTIC NEVI - Tan-brown and/or pink-flesh-colored symmetric macules and papules - Benign appearing on exam today - Observation - Call clinic for new or changing moles - Recommend daily use of broad spectrum spf 30+  sunscreen to sun-exposed areas.   ACTINIC DAMAGE - Chronic condition, secondary to cumulative UV/sun exposure - diffuse scaly erythematous macules with underlying dyspigmentation - Recommend daily broad spectrum sunscreen SPF 30+ to sun-exposed areas, reapply every 2 hours as needed.  - Staying in the shade or wearing long sleeves, sun glasses (UVA+UVB protection) and wide brim hats (4-inch brim around the entire circumference of the hat) are also recommended for sun protection.  - Call for new or changing lesions.  SKIN CANCER SCREENING PERFORMED TODAY NEOPLASM OF UNCERTAIN BEHAVIOR Left Lower Leg - Anterior Epidermal / dermal shaving  Lesion diameter (cm):  0.5 Informed consent: discussed and consent obtained   Timeout: patient name, date of birth, surgical site, and procedure verified   Procedure prep:  Patient was prepped and draped in usual sterile fashion Prep type:  Isopropyl alcohol Instrument used: DermaBlade   Hemostasis achieved with: aluminum chloride   Outcome: patient tolerated procedure well   Post-procedure details: wound care instructions given    Specimen A - Surgical pathology Differential Diagnosis: R/O DN  Check Margins: No  Return in about 1 year (around 04/18/2025) for FBSE F/U.  I, Lyle Cords, as acting as a neurosurgeon for [employer], DO .  Documentation: I have reviewed the above documentation for accuracy and completeness, and I agree with the above.  Delon Lenis, DO

## 2024-04-18 NOTE — Patient Instructions (Addendum)

## 2024-04-21 ENCOUNTER — Encounter: Payer: Self-pay | Admitting: Dermatology

## 2024-04-22 LAB — SURGICAL PATHOLOGY

## 2024-04-23 ENCOUNTER — Ambulatory Visit: Payer: Self-pay | Admitting: Dermatology

## 2024-06-17 NOTE — Telephone Encounter (Signed)
 I don't see a form in my paperwork. Was this completed by another provider while I was on vacation? Has anyone seen the form?

## 2024-06-18 ENCOUNTER — Encounter: Payer: Self-pay | Admitting: Family Medicine

## 2024-06-18 DIAGNOSIS — Z0184 Encounter for antibody response examination: Secondary | ICD-10-CM

## 2024-06-18 NOTE — Telephone Encounter (Signed)
Completed and pt picked up.

## 2024-06-20 LAB — HAV, HBV IMMUNITY
Hep B Core Total Ab: NEGATIVE
Hep B Surface Ab, Qual: REACTIVE
hep A Total Ab: NEGATIVE

## 2024-06-20 LAB — MEASLES/MUMPS/RUBELLA IMMUNITY
MUMPS ABS, IGG: 81 [AU]/ml
RUBEOLA AB, IGG: >300 [AU]/ml
Rubella Antibodies, IGG: 9.53 {index}

## 2024-06-20 LAB — VZV IGG IMMUNITY
VZV IGG: 9.43 {s_co_ratio}
VZV IgG: REACTIVE

## 2024-06-20 LAB — PLEASE NOTE:

## 2024-06-25 ENCOUNTER — Ambulatory Visit: Payer: Self-pay | Admitting: Family Medicine

## 2024-10-01 ENCOUNTER — Encounter: Admitting: Family Medicine

## 2025-04-23 ENCOUNTER — Ambulatory Visit: Admitting: Dermatology
